# Patient Record
Sex: Male | Born: 1980 | Race: Black or African American | Hispanic: No | Marital: Single | Smoking: Current every day smoker
Health system: Southern US, Community
[De-identification: ages and names within clinical notes are randomized; demographics above are authoritative.]

## PROBLEM LIST (undated history)

## (undated) DIAGNOSIS — F259 Schizoaffective disorder, unspecified: Secondary | ICD-10-CM

## (undated) HISTORY — PX: CHOLECYSTECTOMY: SHX55

## (undated) HISTORY — DX: Schizoaffective disorder, unspecified: F25.9

## (undated) HISTORY — PX: LEG SURGERY: SHX1003

---

## 2010-12-05 ENCOUNTER — Emergency Department: Payer: Self-pay | Admitting: Emergency Medicine

## 2013-01-16 ENCOUNTER — Ambulatory Visit: Payer: Self-pay | Admitting: Urology

## 2013-10-27 ENCOUNTER — Encounter: Payer: Self-pay | Admitting: *Deleted

## 2013-11-12 ENCOUNTER — Encounter: Payer: Self-pay | Admitting: General Surgery

## 2013-11-12 ENCOUNTER — Ambulatory Visit (INDEPENDENT_AMBULATORY_CARE_PROVIDER_SITE_OTHER): Payer: Medicaid Other | Admitting: General Surgery

## 2013-11-12 VITALS — BP 100/58 | HR 76 | Resp 14 | Ht 72.0 in | Wt 166.0 lb

## 2013-11-12 DIAGNOSIS — L723 Sebaceous cyst: Secondary | ICD-10-CM

## 2013-11-12 NOTE — Patient Instructions (Signed)
Patient to return in one week nurse  

## 2013-11-12 NOTE — Progress Notes (Signed)
Patient ID: Manuel Horn, male   DOB: 1980/08/25, 33 y.o.   MRN: [id number]  Chief Complaint  Patient presents with  . Other    abscess to scalp    HPI Manuel Horn is a 33 y.o. male.  Here today for evaluation of abscess on his scalp. States it has been there for about 2 months.He states he noticed this while using his clippers.Patient states the area coming and goes over the past two years. Painful with touch, drainage on 10/24/13 after his visit with DR.Khan. He states it was brownish color that was draining. Finish up his Nitrofurantoin 100 mg on 11/06/13.  HPI  Past Medical History  Diagnosis Date  . Schizoaffective disorder     Past Surgical History  Procedure Laterality Date  . Leg surgery Left     No family history on file.  Social History History  Substance Use Topics  . Smoking status: Current Every Day Smoker -- 2.00 packs/day    Types: Cigarettes  . Smokeless tobacco: Never Used  . Alcohol Use: No    Allergies  Allergen Reactions  . Sulfa Antibiotics Rash    Current Outpatient Prescriptions  Medication Sig Dispense Refill  . Paliperidone Palmitate (INVEGA SUSTENNA) 234 MG/1.5ML SUSP Inject 1 Syringe into the muscle every 30 (thirty) days.       No current facility-administered medications for this visit.    Review of Systems Review of Systems  Constitutional: Negative.   Respiratory: Negative.   Cardiovascular: Negative.     Blood pressure 100/58, pulse 76, resp. rate 14, height 6' (1.829 m), weight 166 lb (75.297 kg).  Physical Exam Physical Exam  Constitutional: He is oriented to person, place, and time. He appears well-developed and well-nourished.  HENT:  Head:    Neurological: He is alert and oriented to person, place, and time.  Skin: Skin is warm and dry.  Examination of the skin on the scalp shows a sizable Sebaceous cyst with a small satellite area also identified.  Data Reviewed PCP notes Of October 24, 2013.  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 10 cc of 0.5% Xylocaine with 0.25% Marcaine with 1-200,000 units of epinephrine was utilized well tolerated. The area was excised elliptical incision. Hemostasis was with 3-0 Vicryl suture ligatures. Deep tissue was approximate 3-0 Vicryl. Skin was closed with 4-0 Prolene sutures. Bacitracin applied to the wound.  A prescription for Norco 5/325, #20 with the inscription 1-2 p.o. Q.4 h. P.r.n. For pain with no refills was provided.  Arrangements will be made for nursing exam and suture removal in one week and position exam in 2 weeks.      Ref: Vincent Gros NP Dr. Beverely Risen  Earline Mayotte 11/14/2013, 6:46 PM

## 2013-11-14 DIAGNOSIS — L723 Sebaceous cyst: Secondary | ICD-10-CM | POA: Insufficient documentation

## 2013-11-14 LAB — PATHOLOGY

## 2013-11-20 ENCOUNTER — Ambulatory Visit (INDEPENDENT_AMBULATORY_CARE_PROVIDER_SITE_OTHER): Payer: Medicaid Other | Admitting: *Deleted

## 2013-11-20 DIAGNOSIS — L723 Sebaceous cyst: Secondary | ICD-10-CM

## 2013-11-20 NOTE — Progress Notes (Signed)
Patient came in today for a wound check.  The wound is clean, with no signs of infection noted.The sutures were removed.  

## 2013-12-15 ENCOUNTER — Ambulatory Visit: Payer: Medicaid Other | Admitting: General Surgery

## 2014-01-14 ENCOUNTER — Encounter: Payer: Self-pay | Admitting: *Deleted

## 2017-07-09 ENCOUNTER — Ambulatory Visit: Payer: Self-pay | Admitting: Nurse Practitioner

## 2017-10-24 ENCOUNTER — Ambulatory Visit: Payer: Self-pay | Admitting: Adult Health

## 2017-11-01 ENCOUNTER — Ambulatory Visit: Payer: Medicaid Other | Admitting: Adult Health

## 2017-11-01 ENCOUNTER — Encounter: Payer: Self-pay | Admitting: Adult Health

## 2017-11-01 VITALS — BP 105/67 | HR 72 | Resp 16 | Ht 72.0 in | Wt 161.0 lb

## 2017-11-01 DIAGNOSIS — F17200 Nicotine dependence, unspecified, uncomplicated: Secondary | ICD-10-CM | POA: Diagnosis not present

## 2017-11-01 DIAGNOSIS — F259 Schizoaffective disorder, unspecified: Secondary | ICD-10-CM

## 2017-11-01 DIAGNOSIS — Z0001 Encounter for general adult medical examination with abnormal findings: Secondary | ICD-10-CM

## 2017-11-01 NOTE — Progress Notes (Signed)
[facility] [address] [place], [place] [address]  Internal MEDICINE  Office Visit Note  Patient Name: Manuel Horn  [id number]  [id number]  Date of Service: 11/20/2017  Chief Complaint  Patient presents with  . Annual Exam     HPI Pt is here for routine health maintenance examination. He denies complaints today.  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 He reports smoking approximately 2 packs per day. He denies alcohol or illicit drug use. He denies recent hospitalizations.       Current Medication: Outpatient Encounter Medications as of 11/01/2017  Medication Sig  . Paliperidone Palmitate (INVEGA SUSTENNA) 234 MG/1.5ML SUSP Inject 1 Syringe into the muscle every 30 (thirty) days.   No facility-administered encounter medications on file as of 11/01/2017.     Surgical History: Past Surgical History:  Procedure Laterality Date  . LEG SURGERY Left     Medical History: Past Medical History:  Diagnosis Date  . Schizoaffective disorder (HCC)     Family History: History reviewed. No pertinent family history.    Review of Systems  Constitutional: Negative.  Negative for chills, fatigue and unexpected weight change.  HENT: Negative.  Negative for congestion, rhinorrhea, sneezing and sore throat.   Eyes: Negative for redness.  Respiratory: Negative.  Negative for cough, chest tightness and shortness of breath.   Cardiovascular: Negative.  Negative for chest pain and palpitations.  Gastrointestinal: Negative.  Negative for abdominal pain, constipation, diarrhea, nausea and vomiting.  Endocrine: Negative.   Genitourinary: Negative.  Negative for dysuria and frequency.  Musculoskeletal: Negative.  Negative for arthralgias, back pain, joint swelling and neck pain.  Skin: Negative.  Negative for rash.  Allergic/Immunologic: Negative.   Neurological: Negative.  Negative for  tremors and numbness.  Hematological: Negative for adenopathy. Does not bruise/bleed easily.  Psychiatric/Behavioral: Negative.  Negative for behavioral problems, sleep disturbance and suicidal ideas. The patient is not nervous/anxious.      Vital Signs: BP 105/67   Pulse 72   Resp 16   Ht 6' (1.829 m)   Wt 161 lb (73 kg)   SpO2 97%   BMI 21.84 kg/m    Physical Exam  Constitutional: He is oriented to person, place, and time. He appears well-developed and well-nourished. No distress.  HENT:  Head: Normocephalic and atraumatic.  Mouth/Throat: Oropharynx is clear and moist. No oropharyngeal exudate.  Eyes: Pupils are equal, round, and reactive to light. EOM are normal.  Neck: Normal range of motion. Neck supple. No JVD present. No tracheal deviation present. No thyromegaly present.  Cardiovascular: Normal rate, regular rhythm and normal heart sounds. Exam reveals no gallop and no friction rub.  No murmur heard. Pulmonary/Chest: Effort normal and breath sounds normal. No respiratory distress. He has no wheezes. He has no rales. He exhibits no tenderness.  Abdominal: Soft. There is no tenderness. There is no guarding.  Musculoskeletal: Normal range of motion.  Lymphadenopathy:    He has no cervical adenopathy.  Neurological: He is alert and oriented to person, place, and time. No cranial nerve deficit.  Skin: Skin is warm and dry. He is not diaphoretic.  Psychiatric: He has a normal mood and affect. His behavior is normal. Judgment and thought content normal.  Nursing note and vitals reviewed.    LABS: Recent Results (from the past 2160 hour(s))  CBC with Differential/Platelet     Status: Abnormal   Collection Time: 11/05/17  11:44 AM  Result Value Ref Range   WBC 10.5 3.4 - 10.8 x10E3/uL   RBC 4.13 (L) 4.14 - 5.80 x10E6/uL   Hemoglobin 13.6 13.0 - 17.7 g/dL   Hematocrit 45.440.4 09.837.5 - 51.0 %   MCV 98 (H) 79 - 97 fL   MCH 32.9 26.6 - 33.0 pg   MCHC 33.7 31.5 - 35.7 g/dL   RDW  11.913.4 14.712.3 - 82.915.4 %   Platelets 199 150 - 450 x10E3/uL   Neutrophils 63 Not Estab. %   Lymphs 32 Not Estab. %   Monocytes 5 Not Estab. %   Eos 0 Not Estab. %   Basos 0 Not Estab. %   Neutrophils Absolute 6.6 1.4 - 7.0 x10E3/uL   Lymphocytes Absolute 3.3 (H) 0.7 - 3.1 x10E3/uL   Monocytes Absolute 0.5 0.1 - 0.9 x10E3/uL   EOS (ABSOLUTE) 0.0 0.0 - 0.4 x10E3/uL   Basophils Absolute 0.0 0.0 - 0.2 x10E3/uL   Immature Granulocytes 0 Not Estab. %   Immature Grans (Abs) 0.0 0.0 - 0.1 x10E3/uL  Lipid Panel With LDL/HDL Ratio     Status: Abnormal   Collection Time: 11/05/17 11:44 AM  Result Value Ref Range   Cholesterol, Total 133 100 - 199 mg/dL   Triglycerides 96 0 - 149 mg/dL   HDL 25 (L) >56>39 mg/dL   VLDL Cholesterol Cal 19 5 - 40 mg/dL   LDL Calculated 89 0 - 99 mg/dL   LDl/HDL Ratio 3.6 0.0 - 3.6 ratio    Comment:                                     LDL/HDL Ratio                                             Men  Women                               1/2 Avg.Risk  1.0    1.5                                   Avg.Risk  3.6    3.2                                2X Avg.Risk  6.2    5.0                                3X Avg.Risk  8.0    6.1   TSH     Status: None   Collection Time: 11/05/17 11:44 AM  Result Value Ref Range   TSH 0.751 0.450 - 4.500 uIU/mL  T4, free     Status: None   Collection Time: 11/05/17 11:44 AM  Result Value Ref Range   Free T4 1.13 0.82 - 1.77 ng/dL  Comprehensive metabolic panel     Status: Abnormal   Collection Time: 11/05/17 11:44 AM  Result Value Ref Range   Glucose 93 65 - 99 mg/dL   BUN 10 6 - 20 mg/dL  Creatinine, Ser 1.31 (H) 0.76 - 1.27 mg/dL   GFR calc non Af Amer 69 >59 mL/min/1.73   GFR calc Af Amer 80 >59 mL/min/1.73   BUN/Creatinine Ratio 8 (L) 9 - 20   Sodium 140 134 - 144 mmol/L   Potassium 4.5 3.5 - 5.2 mmol/L   Chloride 102 96 - 106 mmol/L   CO2 24 20 - 29 mmol/L   Calcium 9.6 8.7 - 10.2 mg/dL   Total Protein 7.6 6.0 - 8.5 g/dL    Albumin 4.6 3.5 - 5.5 g/dL   Globulin, Total 3.0 1.5 - 4.5 g/dL   Albumin/Globulin Ratio 1.5 1.2 - 2.2   Bilirubin Total 0.6 0.0 - 1.2 mg/dL   Alkaline Phosphatase 79 39 - 117 IU/L   AST 15 0 - 40 IU/L   ALT 7 0 - 44 IU/L    Assessment/Plan: 1. Encounter for general adult medical examination with abnormal findings - CBC with Differential/Platelet - Lipid Panel With LDL/HDL Ratio - TSH - T4, free - Comprehensive metabolic panel  2. Schizoaffective disorder, unspecified type (HCC) See [facility] and they prescribe his [place].    3. Smoking Smoking cessation counseling: 1. Pt acknowledges the risks of long term smoking, she will try to quite smoking. 2. Options for different medications including nicotine products, chewing gum, patch etc, Wellbutrin and Chantix is discussed 3. Goal and date of compete cessation is discussed 4. Total time spent in smoking cessation is 15 min.  General Counseling: Joseph ArtBrian verbalizes understanding of the findings of todays visit and agrees with plan of treatment. I have discussed any further diagnostic evaluation that may be needed or ordered today. We also reviewed his medications today. he has been encouraged to call the office with any questions or concerns that should arise related to todays visit.   Orders Placed This Encounter  Procedures  . CBC with Differential/Platelet  . Lipid Panel With LDL/HDL Ratio  . TSH  . T4, free  . Comprehensive metabolic panel     Time spent: 25 Minutes   This patient was seen by Blima LedgerAdam Sweden Lesure AGNP-C in Collaboration with Dr Lyndon CodeFozia M Khan as a part of collaborative care agreement   Lyndon CodeFozia M Khan, MD  Internal Medicine

## 2017-11-01 NOTE — Patient Instructions (Signed)
Coping with Quitting Smoking Quitting smoking is a physical and mental challenge. You will face cravings, withdrawal symptoms, and temptation. Before quitting, work with your health care provider to make a plan that can help you cope. Preparation can help you quit and keep you from giving in. How can I cope with cravings? Cravings usually last for 5-10 minutes. If you get through it, the craving will pass. Consider taking the following actions to help you cope with cravings:  Keep your mouth busy: ? Chew sugar-free gum. ? Suck on hard candies or a straw. ? Brush your teeth.  Keep your hands and body busy: ? Immediately change to a different activity when you feel a craving. ? Squeeze or play with a ball. ? Do an activity or a hobby, like making bead jewelry, practicing needlepoint, or working with wood. ? Mix up your normal routine. ? Take a short exercise break. Go for a quick walk or run up and down stairs. ? Spend time in public places where smoking is not allowed.  Focus on doing something kind or helpful for someone else.  Call a friend or family member to talk during a craving.  Join a support group.  Call a quit line, such as 1-800-QUIT-NOW.  Talk with your health care provider about medicines that might help you cope with cravings and make quitting easier for you.  How can I deal with withdrawal symptoms? Your body may experience negative effects as it tries to get used to not having nicotine in the system. These effects are called withdrawal symptoms. They may include:  Feeling hungrier than normal.  Trouble concentrating.  Irritability.  Trouble sleeping.  Feeling depressed.  Restlessness and agitation.  Craving a cigarette.  To manage withdrawal symptoms:  Avoid places, people, and activities that trigger your cravings.  Remember why you want to quit.  Get plenty of sleep.  Avoid coffee and other caffeinated drinks. These may worsen some of your  symptoms.  How can I handle social situations? Social situations can be difficult when you are quitting smoking, especially in the first few weeks. To manage this, you can:  Avoid parties, bars, and other social situations where people might be smoking.  Avoid alcohol.  Leave right away if you have the urge to smoke.  Explain to your family and friends that you are quitting smoking. Ask for understanding and support.  Plan activities with friends or family where smoking is not an option.  What are some ways I can cope with stress? Wanting to smoke may cause stress, and stress can make you want to smoke. Find ways to manage your stress. Relaxation techniques can help. For example:  Breathe slowly and deeply, in through your nose and out through your mouth.  Listen to soothing, relaxing music.  Talk with a family member or friend about your stress.  Light a candle.  Soak in a bath or take a shower.  Think about a peaceful place.  What are some ways I can prevent weight gain? Be aware that many people gain weight after they quit smoking. However, not everyone does. To keep from gaining weight, have a plan in place before you quit and stick to the plan after you quit. Your plan should include:  Having healthy snacks. When you have a craving, it may help to: ? Eat plain popcorn, crunchy carrots, celery, or other cut vegetables. ? Chew sugar-free gum.  Changing how you eat: ? Eat small portion sizes at meals. ?   Eat 4-6 small meals throughout the day instead of 1-2 large meals a day. ? Be mindful when you eat. Do not watch television or do other things that might distract you as you eat.  Exercising regularly: ? Make time to exercise each day. If you do not have time for a long workout, do short bouts of exercise for 5-10 minutes several times a day. ? Do some form of strengthening exercise, like weight lifting, and some form of aerobic exercise, like running or  swimming.  Drinking plenty of water or other low-calorie or no-calorie drinks. Drink 6-8 glasses of water daily, or as much as instructed by your health care provider.  Summary  Quitting smoking is a physical and mental challenge. You will face cravings, withdrawal symptoms, and temptation to smoke again. Preparation can help you as you go through these challenges.  You can cope with cravings by keeping your mouth busy (such as by chewing gum), keeping your body and hands busy, and making calls to family, friends, or a helpline for people who want to quit smoking.  You can cope with withdrawal symptoms by avoiding places where people smoke, avoiding drinks with caffeine, and getting plenty of rest.  Ask your health care provider about the different ways to prevent weight gain, avoid stress, and handle social situations. This information is not intended to replace advice given to you by your health care provider. Make sure you discuss any questions you have with your health care provider. Document Released: 03/03/2016 Document Revised: 03/03/2016 Document Reviewed: 03/03/2016 Elsevier Interactive Patient Education  2018 Elsevier Inc.  

## 2017-11-05 ENCOUNTER — Encounter: Payer: Self-pay | Admitting: Emergency Medicine

## 2017-11-05 ENCOUNTER — Other Ambulatory Visit: Payer: Self-pay

## 2017-11-05 ENCOUNTER — Emergency Department
Admission: EM | Admit: 2017-11-05 | Discharge: 2017-11-05 | Disposition: A | Discharge status: Home / Self Care | Payer: No Typology Code available for payment source | Attending: Emergency Medicine | Admitting: Emergency Medicine

## 2017-11-05 DIAGNOSIS — Z23 Encounter for immunization: Secondary | ICD-10-CM | POA: Diagnosis not present

## 2017-11-05 DIAGNOSIS — S8991XA Unspecified injury of right lower leg, initial encounter: Secondary | ICD-10-CM | POA: Diagnosis present

## 2017-11-05 DIAGNOSIS — F17210 Nicotine dependence, cigarettes, uncomplicated: Secondary | ICD-10-CM | POA: Diagnosis not present

## 2017-11-05 DIAGNOSIS — Y92410 Unspecified street and highway as the place of occurrence of the external cause: Secondary | ICD-10-CM | POA: Insufficient documentation

## 2017-11-05 DIAGNOSIS — S8001XA Contusion of right knee, initial encounter: Secondary | ICD-10-CM

## 2017-11-05 DIAGNOSIS — Z79899 Other long term (current) drug therapy: Secondary | ICD-10-CM | POA: Insufficient documentation

## 2017-11-05 DIAGNOSIS — Y939 Activity, unspecified: Secondary | ICD-10-CM | POA: Insufficient documentation

## 2017-11-05 DIAGNOSIS — Y998 Other external cause status: Secondary | ICD-10-CM | POA: Diagnosis not present

## 2017-11-05 MED ORDER — TETANUS-DIPHTH-ACELL PERTUSSIS 5-2.5-18.5 LF-MCG/0.5 IM SUSP
0.5000 mL | Freq: Once | INTRAMUSCULAR | Status: AC
  Administered 2017-11-05: 0.5 mL via INTRAMUSCULAR
  Filled 2017-11-05: qty 0.5
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
## 2017-11-05 NOTE — Discharge Instructions (Addendum)
Follow-up with your primary care provider if any continued problems.  Apply ice to your knee as needed for pain or swelling.  Watch the abrasion for any signs of infection.  You may take Tylenol or ibuprofen as needed for pain or stiffness.  Be aware that you will be sore and stiff all over likely the next 2 to 3 days.
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
## 2017-11-07 LAB — COMPREHENSIVE METABOLIC PANEL
ALBUMIN: 4.6 g/dL (ref 3.5–5.5)
ALT: 7 IU/L (ref 0–44)
AST: 15 IU/L (ref 0–40)
Albumin/Globulin Ratio: 1.5 (ref 1.2–2.2)
Alkaline Phosphatase: 79 IU/L (ref 39–117)
BUN / CREAT RATIO: 8 — AB (ref 9–20)
BUN: 10 mg/dL (ref 6–20)
Bilirubin Total: 0.6 mg/dL (ref 0.0–1.2)
CO2: 24 mmol/L (ref 20–29)
CREATININE: 1.31 mg/dL — AB (ref 0.76–1.27)
Calcium: 9.6 mg/dL (ref 8.7–10.2)
Chloride: 102 mmol/L (ref 96–106)
GFR calc Af Amer: 80 mL/min/{1.73_m2} (ref >59)
GFR calc non Af Amer: 69 mL/min/{1.73_m2} (ref >59)
GLUCOSE: 93 mg/dL (ref 65–99)
Globulin, Total: 3 g/dL (ref 1.5–4.5)
Potassium: 4.5 mmol/L (ref 3.5–5.2)
Sodium: 140 mmol/L (ref 134–144)
Total Protein: 7.6 g/dL (ref 6.0–8.5)

## 2017-11-07 LAB — CBC WITH DIFFERENTIAL/PLATELET
Basophils Absolute: 0 10*3/uL (ref 0.0–0.2)
Basos: 0 %
EOS (ABSOLUTE): 0 10*3/uL (ref 0.0–0.4)
Eos: 0 %
Hematocrit: 40.4 % (ref 37.5–51.0)
Hemoglobin: 13.6 g/dL (ref 13.0–17.7)
IMMATURE GRANULOCYTES: 0 %
Immature Grans (Abs): 0 10*3/uL (ref 0.0–0.1)
Lymphocytes Absolute: 3.3 10*3/uL — ABNORMAL HIGH (ref 0.7–3.1)
Lymphs: 32 %
MCH: 32.9 pg (ref 26.6–33.0)
MCHC: 33.7 g/dL (ref 31.5–35.7)
MCV: 98 fL — ABNORMAL HIGH (ref 79–97)
Monocytes Absolute: 0.5 10*3/uL (ref 0.1–0.9)
Monocytes: 5 %
NEUTROS PCT: 63 %
Neutrophils Absolute: 6.6 10*3/uL (ref 1.4–7.0)
PLATELETS: 199 10*3/uL (ref 150–450)
RBC: 4.13 x10E6/uL — ABNORMAL LOW (ref 4.14–5.80)
RDW: 13.4 % (ref 12.3–15.4)
WBC: 10.5 10*3/uL (ref 3.4–10.8)

## 2017-11-07 LAB — LIPID PANEL WITH LDL/HDL RATIO
Cholesterol, Total: 133 mg/dL (ref 100–199)
HDL: 25 mg/dL — ABNORMAL LOW (ref >39)
LDL Calculated: 89 mg/dL (ref 0–99)
LDl/HDL Ratio: 3.6 ratio (ref 0.0–3.6)
Triglycerides: 96 mg/dL (ref 0–149)
VLDL Cholesterol Cal: 19 mg/dL (ref 5–40)

## 2017-11-07 LAB — T4, FREE: FREE T4: 1.13 ng/dL (ref 0.82–1.77)

## 2017-11-07 LAB — TSH: TSH: 0.751 u[IU]/mL (ref 0.450–4.500)

## 2017-11-22 ENCOUNTER — Telehealth: Payer: Self-pay | Admitting: Internal Medicine

## 2017-11-22 ENCOUNTER — Other Ambulatory Visit: Payer: Self-pay | Admitting: Internal Medicine

## 2017-11-22 DIAGNOSIS — R7989 Other specified abnormal findings of blood chemistry: Secondary | ICD-10-CM

## 2017-11-22 NOTE — Telephone Encounter (Signed)
Called and lm for patient to return call regarding labs

## 2017-11-22 NOTE — Telephone Encounter (Signed)
-----   Message from Lyndon Code, MD sent at 11/22/2017  8:21 AM EDT ----- Regarding: abnromal renal functions( kidneys are slightly slow)  His cr is slightly elevated for his age, ask him if he is taking any protein powders or supplements, will repeat his labs in October , order is sent to [facility], he can go mid October

## 2017-11-22 NOTE — Progress Notes (Signed)
bas

## 2017-11-22 NOTE — Telephone Encounter (Signed)
Spoke to his mother and she states that he does not take any supplements or protein. Pt will redo labs in mid October.

## 2018-01-18 ENCOUNTER — Other Ambulatory Visit: Payer: Self-pay | Admitting: Internal Medicine

## 2018-01-19 LAB — BASIC METABOLIC PANEL
BUN / CREAT RATIO: 4 — AB (ref 9–20)
BUN: 5 mg/dL — AB (ref 6–20)
CHLORIDE: 100 mmol/L (ref 96–106)
CO2: 25 mmol/L (ref 20–29)
Calcium: 9.6 mg/dL (ref 8.7–10.2)
Creatinine, Ser: 1.14 mg/dL (ref 0.76–1.27)
GFR calc non Af Amer: 82 mL/min/{1.73_m2} (ref >59)
GFR, EST AFRICAN AMERICAN: 94 mL/min/{1.73_m2} (ref >59)
GLUCOSE: 107 mg/dL — AB (ref 65–99)
POTASSIUM: 3.8 mmol/L (ref 3.5–5.2)
Sodium: 142 mmol/L (ref 134–144)

## 2018-09-02 DIAGNOSIS — J301 Allergic rhinitis due to pollen: Secondary | ICD-10-CM

## 2018-11-05 ENCOUNTER — Encounter: Payer: Self-pay | Admitting: Adult Health

## 2018-11-05 ENCOUNTER — Other Ambulatory Visit: Payer: Self-pay

## 2018-11-05 ENCOUNTER — Ambulatory Visit: Payer: Medicaid Other | Admitting: Adult Health

## 2018-11-05 VITALS — BP 102/64 | HR 85 | Resp 16 | Ht 72.0 in | Wt 157.0 lb

## 2018-11-05 DIAGNOSIS — R300 Dysuria: Secondary | ICD-10-CM | POA: Diagnosis not present

## 2018-11-05 DIAGNOSIS — Z0001 Encounter for general adult medical examination with abnormal findings: Secondary | ICD-10-CM

## 2018-11-05 DIAGNOSIS — F259 Schizoaffective disorder, unspecified: Secondary | ICD-10-CM | POA: Diagnosis not present

## 2018-11-05 DIAGNOSIS — F17200 Nicotine dependence, unspecified, uncomplicated: Secondary | ICD-10-CM | POA: Diagnosis not present

## 2018-11-05 NOTE — Progress Notes (Signed)
[facility] [place], [place] [address]  Internal MEDICINE  Office Visit Note  Patient Name: Manuel Horn  [id number]  [id number]  Date of Service: 11/05/2018  Chief Complaint  Patient presents with  . Medical Management of Chronic Issues  . Annual Exam  . Schizophrenia     HPI Pt is here for routine health maintenance examination.  He is a well appearing 38 yo AA male.  He has a history of schizophrenia. Overall he is doing well he denies any current issues.  He takes the [place] shot monthly for his schizophrenia.  He sees [facility], he has an appt tomorrow. He denies alcohol or illicit drug use. However he does reports smoking 2 ppd of cigarettes.      Current Medication: Outpatient Encounter Medications as of 11/05/2018  Medication Sig  . Paliperidone Palmitate (INVEGA SUSTENNA) 234 MG/1.5ML SUSP Inject 1 Syringe into the muscle every 30 (thirty) days.  . [DISCONTINUED] Paliperidone ER (INVEGA SUSTENNA) injection Inject into the muscle.   No facility-administered encounter medications on file as of 11/05/2018.     Surgical History: Past Surgical History:  Procedure Laterality Date  . LEG SURGERY Left     Medical History: Past Medical History:  Diagnosis Date  . Schizoaffective disorder ([place])     Family History: Family History  Family history unknown: Yes      Review of Systems  Constitutional: Negative.  Negative for chills, fatigue and unexpected weight change.  HENT: Negative.  Negative for congestion, rhinorrhea, sneezing and sore throat.   Eyes: Negative for redness.  Respiratory: Negative.  Negative for cough, chest tightness and shortness of breath.   Cardiovascular: Negative.  Negative for chest pain and palpitations.  Gastrointestinal: Negative.  Negative for abdominal pain, constipation, diarrhea, nausea and vomiting.  Endocrine: Negative.   Genitourinary: Negative.  Negative for dysuria and frequency.   Musculoskeletal: Negative.  Negative for arthralgias, back pain, joint swelling and neck pain.  Skin: Negative.  Negative for rash.  Allergic/Immunologic: Negative.   Neurological: Negative.  Negative for tremors and numbness.  Hematological: Negative for adenopathy. Does not bruise/bleed easily.  Psychiatric/Behavioral: Negative.  Negative for behavioral problems, sleep disturbance and suicidal ideas. The patient is not nervous/anxious.      Vital Signs: BP 102/64   Pulse 85   Resp 16   Ht 6' (1.829 m)   Wt 157 lb (71.2 kg)   SpO2 98%   BMI 21.29 kg/m    Physical Exam Vitals signs and nursing note reviewed.  Constitutional:      General: He is not in acute distress.    Appearance: He is well-developed. He is not diaphoretic.  HENT:     Head: Normocephalic and atraumatic.     Mouth/Throat:     Pharynx: No oropharyngeal exudate.  Eyes:     Pupils: Pupils are equal, round, and reactive to light.  Neck:     Musculoskeletal: Normal range of motion and neck supple.     Thyroid: No thyromegaly.     Vascular: No JVD.     Trachea: No tracheal deviation.  Cardiovascular:     Rate and Rhythm: Normal rate and regular rhythm.     Heart sounds: Normal heart sounds. No murmur. No friction rub. No gallop.   Pulmonary:     Effort: Pulmonary effort is normal. No respiratory distress.     Breath sounds: Normal breath sounds. No wheezing or rales.  Chest:     Chest  wall: No tenderness.  Abdominal:     Palpations: Abdomen is soft.     Tenderness: There is no abdominal tenderness. There is no guarding.  Musculoskeletal: Normal range of motion.  Lymphadenopathy:     Cervical: No cervical adenopathy.  Skin:    General: Skin is warm and dry.  Neurological:     Mental Status: He is alert and oriented to person, place, and time.     Cranial Nerves: No cranial nerve deficit.  Psychiatric:        Behavior: Behavior normal.        Thought Content: Thought content normal.         Judgment: Judgment normal.      LABS: No results found for this or any previous visit (from the past 2160 hour(s)).   Assessment/Plan: 1. Encounter for general adult medical examination with abnormal findings Up to date on PHM. - CBC with Differential/Platelet - Lipid Panel With LDL/HDL Ratio - TSH - T4, free - Comprehensive metabolic panel  2. Schizoaffective disorder, unspecified type (HCC) Currently appears to be well controlled on Invega.  Continue current treatment.   3. Smoking Smoking cessation counseling: 1. Pt acknowledges the risks of long term smoking, she will try to quite smoking. 2. Options for different medications including nicotine products, chewing gum, patch etc, Wellbutrin and Chantix is discussed 3. Goal and date of compete cessation is discussed 4. Total time spent in smoking cessation is 15 min.  General Counseling: Joseph ArtBrian verbalizes understanding of the findings of todays visit and agrees with plan of treatment. I have discussed any further diagnostic evaluation that may be needed or ordered today. We also reviewed his medications today. he has been encouraged to call the office with any questions or concerns that should arise related to todays visit.   Orders Placed This Encounter  Procedures  . CBC with Differential/Platelet  . Lipid Panel With LDL/HDL Ratio  . TSH  . T4, free  . Comprehensive metabolic panel    No orders of the defined types were placed in this encounter.   Time spent: 25 Minutes   This patient was seen by Blima LedgerAdam Ayson Cherubini AGNP-C in Collaboration with Dr Lyndon CodeFozia M Khan as a part of collaborative care agreement    Johnna AcostaAdam J. Khaliq Turay AGNP-C Internal Medicine

## 2018-11-07 LAB — URINE CULTURE, REFLEX

## 2018-11-07 LAB — UA/M W/RFLX CULTURE, ROUTINE
Bilirubin, UA: NEGATIVE
Glucose, UA: NEGATIVE
Ketones, UA: NEGATIVE
Nitrite, UA: NEGATIVE
Protein,UA: NEGATIVE
Specific Gravity, UA: 1.01 (ref 1.005–1.030)
Urobilinogen, Ur: 0.2 mg/dL (ref 0.2–1.0)
pH, UA: 6.5 (ref 5.0–7.5)

## 2018-11-07 LAB — MICROSCOPIC EXAMINATION: Casts: NONE SEEN /lpf

## 2018-11-28 ENCOUNTER — Other Ambulatory Visit: Payer: Self-pay

## 2018-11-28 ENCOUNTER — Emergency Department: Payer: Medicaid Other

## 2018-11-28 ENCOUNTER — Emergency Department
Admission: EM | Admit: 2018-11-28 | Discharge: 2018-11-28 | Disposition: A | Discharge status: Home / Self Care | Payer: Medicaid Other | Attending: Emergency Medicine | Admitting: Emergency Medicine

## 2018-11-28 DIAGNOSIS — F17210 Nicotine dependence, cigarettes, uncomplicated: Secondary | ICD-10-CM | POA: Insufficient documentation

## 2018-11-28 DIAGNOSIS — K8020 Calculus of gallbladder without cholecystitis without obstruction: Secondary | ICD-10-CM | POA: Insufficient documentation

## 2018-11-28 DIAGNOSIS — R112 Nausea with vomiting, unspecified: Secondary | ICD-10-CM

## 2018-11-28 DIAGNOSIS — N390 Urinary tract infection, site not specified: Secondary | ICD-10-CM | POA: Insufficient documentation

## 2018-11-28 LAB — URINALYSIS, COMPLETE (UACMP) WITH MICROSCOPIC
Bilirubin Urine: NEGATIVE
Glucose, UA: NEGATIVE mg/dL
Ketones, ur: NEGATIVE mg/dL
Nitrite: NEGATIVE
Protein, ur: 30 mg/dL — AB
Specific Gravity, Urine: 1.028 (ref 1.005–1.030)
pH: 5 (ref 5.0–8.0)

## 2018-11-28 LAB — COMPREHENSIVE METABOLIC PANEL
ALT: 10 U/L (ref 0–44)
AST: 17 U/L (ref 15–41)
Albumin: 4.3 g/dL (ref 3.5–5.0)
Alkaline Phosphatase: 67 U/L (ref 38–126)
Anion gap: 8 (ref 5–15)
BUN: 5 mg/dL — ABNORMAL LOW (ref 6–20)
CO2: 30 mmol/L (ref 22–32)
Calcium: 9.2 mg/dL (ref 8.9–10.3)
Chloride: 101 mmol/L (ref 98–111)
Creatinine, Ser: 1.04 mg/dL (ref 0.61–1.24)
GFR calc Af Amer: >60 mL/min (ref >60)
GFR calc non Af Amer: >60 mL/min (ref >60)
Glucose, Bld: 117 mg/dL — ABNORMAL HIGH (ref 70–99)
Potassium: 4 mmol/L (ref 3.5–5.1)
Sodium: 139 mmol/L (ref 135–145)
Total Bilirubin: 0.4 mg/dL (ref 0.3–1.2)
Total Protein: 8 g/dL (ref 6.5–8.1)

## 2018-11-28 LAB — LIPASE, BLOOD: Lipase: 19 U/L (ref 11–51)

## 2018-11-28 LAB — CBC
HCT: 39.4 % (ref 39.0–52.0)
Hemoglobin: 13.6 g/dL (ref 13.0–17.0)
MCH: 33.4 pg (ref 26.0–34.0)
MCHC: 34.5 g/dL (ref 30.0–36.0)
MCV: 96.8 fL (ref 80.0–100.0)
Platelets: 190 10*3/uL (ref 150–400)
RBC: 4.07 MIL/uL — ABNORMAL LOW (ref 4.22–5.81)
RDW: 12 % (ref 11.5–15.5)
WBC: 12.7 10*3/uL — ABNORMAL HIGH (ref 4.0–10.5)
nRBC: 0 % (ref 0.0–0.2)

## 2018-11-28 MED ORDER — CIPROFLOXACIN HCL 500 MG PO TABS: 500.0000 mg | ORAL_TABLET | Freq: Two times a day (BID) | ORAL | 0 refills | Status: AC

## 2018-11-28 MED ORDER — FAMOTIDINE 20 MG PO TABS: 20.0000 mg | ORAL_TABLET | Freq: Two times a day (BID) | ORAL | 1 refills | Status: DC

## 2018-11-28 MED ORDER — SODIUM CHLORIDE 0.9% FLUSH: 3.0000 mL | Freq: Once | INTRAVENOUS | Status: DC

## 2018-11-28 MED ORDER — ONDANSETRON 4 MG PO TBDP: 4.0000 mg | ORAL_TABLET | Freq: Three times a day (TID) | ORAL | 0 refills | Status: DC | PRN

## 2018-11-28 NOTE — ED Notes (Signed)
Family at bedside. 

## 2018-11-28 NOTE — ED Provider Notes (Addendum)
[facility] Emergency Department Provider Note       Time seen: ----------------------------------------- 1:16 PM on 11/28/2018 -----------------------------------------   I have reviewed the triage vital signs and the nursing notes.  HISTORY   Chief Complaint Abdominal Pain    HPI Manuel Horn is a 38 y.o. male with a history of schizoaffective disorder who presents to the ED for abdominal pain with nausea and vomiting for a year that he states started up again today.  Patient thought he probably need to be checked out for this.  He does report a history of UTI in the past.  He currently has no discomfort at this time.  Past Medical History:  Diagnosis Date  . Schizoaffective disorder [facility])     Patient Active Problem List   Diagnosis Date Noted  . Sebaceous cyst 11/14/2013    Past Surgical History:  Procedure Laterality Date  . LEG SURGERY Left     Allergies Sulfa antibiotics  Social History Social History   Tobacco Use  . Smoking status: Current Every Day Smoker    Packs/day: 2.00    Types: Cigarettes  . Smokeless tobacco: Never Used  Substance Use Topics  . Alcohol use: No  . Drug use: No   Review of Systems Constitutional: Negative for fever. Cardiovascular: Negative for chest pain. Respiratory: Negative for shortness of breath. Gastrointestinal: Positive for abdominal pain, nausea vomiting Musculoskeletal: Negative for back pain. Skin: Negative for rash. Neurological: Negative for headaches, focal weakness or numbness.  All systems negative/normal/unremarkable except as stated in the HPI  ____________________________________________   PHYSICAL EXAM:  VITAL SIGNS: ED Triage Vitals  Enc Vitals Group     BP 11/28/18 1035 124/72     Pulse Rate 11/28/18 1035 63     Resp 11/28/18 1035 17     Temp 11/28/18 1035 98.4 F (36.9 C)     Temp Source 11/28/18 1035 Oral     SpO2 11/28/18 1035 100 %     Weight 11/28/18 1036  160 lb (72.6 kg)     Height 11/28/18 1036 6' (1.829 m)     Head Circumference --      Peak Flow --      Pain Score 11/28/18 1036 3     Pain Loc --      Pain Edu? --      Excl. in [place]? --     Constitutional: Alert and oriented. Well appearing and in no distress. Eyes: Conjunctivae are normal. Normal extraocular movements. ENT      Head: Normocephalic and atraumatic.      Nose: No congestion/rhinnorhea.      Mouth/Throat: Mucous membranes are moist.      Neck: No stridor. Cardiovascular: Normal rate, regular rhythm. No murmurs, rubs, or gallops. Respiratory: Normal respiratory effort without tachypnea nor retractions. Breath sounds are clear and equal bilaterally. No wheezes/rales/rhonchi. Gastrointestinal: Soft and nontender. Normal bowel sounds Musculoskeletal: Nontender with normal range of motion in extremities. No lower extremity tenderness nor edema. Neurologic:  Normal speech and language. No gross focal neurologic deficits are appreciated.  Skin:  Skin is warm, dry and intact. No rash noted. Psychiatric: Bizarre mood and affect at times ____________________________________________  ED COURSE:  As part of my medical decision making, I reviewed the following data within the [place] History obtained from family if available, nursing notes, old chart and ekg, as well as notes from prior ED visits. Patient presented for abdominal pain with nausea vomiting, we will  assess with labs and imaging as indicated at this time.   Procedures  Manuel Horn was evaluated in Emergency Department on 11/28/2018 for the symptoms described in the history of present illness. He was evaluated in the context of the global COVID-19 pandemic, which necessitated consideration that the patient might be at risk for infection with the SARS-CoV-2 virus that causes COVID-19. Institutional protocols and algorithms that pertain to the evaluation of patients at risk for COVID-19 are in a state  of rapid change based on information released by regulatory bodies including the CDC and federal and state organizations. These policies and algorithms were followed during the patient's care in the ED.  ____________________________________________   LABS (pertinent positives/negatives)  Labs Reviewed  COMPREHENSIVE METABOLIC PANEL - Abnormal; Notable for the following components:      Result Value   Glucose, Bld 117 (*)    BUN 5 (*)    All other components within normal limits  CBC - Abnormal; Notable for the following components:   WBC 12.7 (*)    RBC 4.07 (*)    All other components within normal limits  URINALYSIS, COMPLETE (UACMP) WITH MICROSCOPIC - Abnormal; Notable for the following components:   Color, Urine YELLOW (*)    APPearance HAZY (*)    Hgb urine dipstick SMALL (*)    Protein, ur 30 (*)    Leukocytes,Ua MODERATE (*)    Bacteria, UA FEW (*)    All other components within normal limits  URINE CULTURE  LIPASE, BLOOD    RADIOLOGY Images were viewed by me  CT renal protocol Did not reveal any acute process US IMPRESSION: Cholelithiasis without sonographic features of acute cholecystitis ____________________________________________   DIFFERENTIAL DIAGNOSIS   GERD, peptic ulcer disease, schizoaffective disorder, UTI, pyelonephritis, gastroparesis  FINAL ASSESSMENT AND PLAN  Abdominal pain, vomiting, cholelithiasis, UTI   Plan: The patient had presented for abdominal pain and vomiting. Patient's labs surprisingly revealed a urinary tract infection.  I will resend a urine culture, he will receive Keflex.  Patient's imaging did not reveal any acute process but did reveal gallstones, I will add antiemetics to his regimen.  Otherwise he is cleared for outpatient follow-up with general surgery for gallstones.   Ulice DashJohnathan E Ravin Denardo, MD    Note: This note was generated in part or whole with voice recognition software. Voice recognition is usually quite accurate  but there are transcription errors that can and very often do occur. I apologize for any typographical errors that were not detected and corrected.     Emily FilbertWilliams, Ariaunna Longsworth E, MD 11/28/18 1317    Emily FilbertWilliams, Morena Mckissack E, MD 11/28/18 [id number]

## 2018-11-28 NOTE — ED Notes (Signed)
Discharge instructions reviewed with patient and mother. Pt denies further questions and is in NAD at time of discharge (1600)

## 2018-11-28 NOTE — ED Triage Notes (Signed)
Pt c/o intermittent abd pain with N/v for a year and states it started up again today and figured he needed to finally be checked out. Pt is in NAD on arrival.

## 2018-11-30 LAB — URINE CULTURE
Culture: <10000 — AB
Special Requests: NORMAL

## 2018-12-10 ENCOUNTER — Other Ambulatory Visit: Payer: Self-pay | Admitting: Surgery

## 2018-12-10 DIAGNOSIS — K8050 Calculus of bile duct without cholangitis or cholecystitis without obstruction: Secondary | ICD-10-CM

## 2018-12-23 ENCOUNTER — Other Ambulatory Visit: Payer: Self-pay

## 2018-12-23 ENCOUNTER — Encounter: Payer: Self-pay | Admitting: Adult Health

## 2018-12-23 ENCOUNTER — Ambulatory Visit: Payer: Medicaid Other | Admitting: Adult Health

## 2018-12-23 VITALS — BP 100/70 | HR 79 | Temp 97.6°F | Resp 16 | Ht 72.0 in | Wt 159.0 lb

## 2018-12-23 DIAGNOSIS — R7989 Other specified abnormal findings of blood chemistry: Secondary | ICD-10-CM | POA: Diagnosis not present

## 2018-12-23 DIAGNOSIS — R1011 Right upper quadrant pain: Secondary | ICD-10-CM | POA: Diagnosis not present

## 2018-12-23 DIAGNOSIS — F259 Schizoaffective disorder, unspecified: Secondary | ICD-10-CM | POA: Diagnosis not present

## 2018-12-23 DIAGNOSIS — F17200 Nicotine dependence, unspecified, uncomplicated: Secondary | ICD-10-CM | POA: Diagnosis not present

## 2018-12-23 NOTE — Progress Notes (Signed)
[facility] [address] [place], [place] [address]  Internal MEDICINE  Office Visit Note  Patient Name: Manuel Horn  [id number]  [id number]  Date of Service: 12/23/2018  Chief Complaint  Patient presents with  . Follow-up    labs    HPI Patient here for lab review. Has been unable to get labs drawn due to not having an I.D. card but did have labs drawn on 11/28/2018 during a visit to emergency department for abdominal pain. Cholesterol and thyroid panel not obtained at E.D. visit, will reorder these panels for patient to have them drawn once I.D. card arrives. No acute issues today, abdominal pain resolved. Imaging revealed cholelithiasis without acute disease. Denies N/V or diarrhea since E.D. visit. Scheduled to further evaluate gallbladder tomorrow at hospital, being followed by general surgery in regard to removing gallbladder.    Current Medication: Outpatient Encounter Medications as of 12/23/2018  Medication Sig  . famotidine (PEPCID) 20 MG tablet Take 1 tablet (20 mg total) by mouth 2 (two) times daily.  . ondansetron (ZOFRAN ODT) 4 MG disintegrating tablet Take 1 tablet (4 mg total) by mouth every 8 (eight) hours as needed for nausea or vomiting.  . Paliperidone Palmitate (INVEGA SUSTENNA) 234 MG/1.5ML SUSP Inject 1 Syringe into the muscle every 30 (thirty) days.   No facility-administered encounter medications on file as of 12/23/2018.     Surgical History: Past Surgical History:  Procedure Laterality Date  . LEG SURGERY Left     Medical History: Past Medical History:  Diagnosis Date  . Schizoaffective disorder (HCC)     Family History: Family History  Family history unknown: Yes    Social History   Socioeconomic History  . Marital status: Single    Spouse name: Not on file  . Number of children: Not on file  . Years of education: Not on file  . Highest education level: Not on file  Occupational History  . Not on file  Social Needs  .  Financial resource strain: Not on file  . Food insecurity    Worry: Not on file    Inability: Not on file  . Transportation needs    Medical: Not on file    Non-medical: Not on file  Tobacco Use  . Smoking status: Current Every Day Smoker    Packs/day: 2.00    Types: Cigarettes  . Smokeless tobacco: Never Used  Substance and Sexual Activity  . Alcohol use: No  . Drug use: No  . Sexual activity: Not on file  Lifestyle  . Physical activity    Days per week: Not on file    Minutes per session: Not on file  . Stress: Not on file  Relationships  . Social Musician on phone: Not on file    Gets together: Not on file    Attends religious service: Not on file    Active member of club or organization: Not on file    Attends meetings of clubs or organizations: Not on file    Relationship status: Not on file  . Intimate partner violence    Fear of current or ex partner: Not on file    Emotionally abused: Not on file    Physically abused: Not on file    Forced sexual activity: Not on file  Other Topics Concern  . Not on file  Social History Narrative  . Not on file      Review of Systems  Constitutional: Negative.  Negative for chills, fatigue and unexpected weight change.  HENT: Negative.  Negative for congestion, rhinorrhea, sneezing and sore throat.   Eyes: Negative for redness.  Respiratory: Negative.  Negative for cough, chest tightness and shortness of breath.   Cardiovascular: Negative.  Negative for chest pain and palpitations.  Gastrointestinal: Negative.  Negative for abdominal pain, constipation, diarrhea, nausea and vomiting.  Endocrine: Negative.   Genitourinary: Negative.  Negative for dysuria and frequency.  Musculoskeletal: Negative.  Negative for arthralgias, back pain, joint swelling and neck pain.  Skin: Negative.  Negative for rash.  Allergic/Immunologic: Negative.   Neurological: Negative.  Negative for tremors and numbness.  Hematological:  Negative for adenopathy. Does not bruise/bleed easily.  Psychiatric/Behavioral: Negative.  Negative for behavioral problems, sleep disturbance and suicidal ideas. The patient is not nervous/anxious.       Vital Signs: BP 100/70   Pulse 79   Temp 97.6 F (36.4 C)   Resp 16   Ht 6' (1.829 m)   Wt 159 lb (72.1 kg)   SpO2 99%   BMI 21.56 kg/m    Physical Exam Vitals signs and nursing note reviewed.  Constitutional:      General: He is not in acute distress.    Appearance: He is well-developed. He is not diaphoretic.  HENT:     Head: Normocephalic and atraumatic.     Mouth/Throat:     Pharynx: No oropharyngeal exudate.  Eyes:     Pupils: Pupils are equal, round, and reactive to light.  Neck:     Musculoskeletal: Normal range of motion and neck supple.     Thyroid: No thyromegaly.     Vascular: No JVD.     Trachea: No tracheal deviation.  Cardiovascular:     Rate and Rhythm: Normal rate and regular rhythm.     Heart sounds: Normal heart sounds. No murmur. No friction rub. No gallop.   Pulmonary:     Effort: Pulmonary effort is normal. No respiratory distress.     Breath sounds: Normal breath sounds. No wheezing or rales.  Chest:     Chest wall: No tenderness.  Abdominal:     Palpations: Abdomen is soft.  Musculoskeletal: Normal range of motion.  Lymphadenopathy:     Cervical: No cervical adenopathy.  Skin:    General: Skin is warm and dry.  Neurological:     Mental Status: He is alert and oriented to person, place, and time.     Cranial Nerves: No cranial nerve deficit.  Psychiatric:        Behavior: Behavior normal.        Thought Content: Thought content normal.        Judgment: Judgment normal.     Assessment/Plan: 1. Schizoaffective disorder, unspecified type ([place]) Stable at this time.  Labs drawn and sent from office today.   - Lipid Panel With LDL/HDL Ratio - TSH - T4, free  2. Right upper quadrant abdominal pain Continues to be worked up by Surgery.   Denies any current abdominal pain, it has mostly resolved.   3. Smoking Smoking cessation counseling: 1. Pt acknowledges the risks of long term smoking, she will try to quite smoking. 2. Options for different medications including nicotine products, chewing gum, patch etc, Wellbutrin and Chantix is discussed 3. Goal and date of compete cessation is discussed 4. Total time spent in smoking cessation is 15 min.  4. Elevated serum creatinine Resolved at this time, continue to monitor.   General Counseling: warwick nick understanding  of the findings of todays visit and agrees with plan of treatment. I have discussed any further diagnostic evaluation that may be needed or ordered today. We also reviewed his medications today. he has been encouraged to call the office with any questions or concerns that should arise related to todays visit.    No orders of the defined types were placed in this encounter.   No orders of the defined types were placed in this encounter.   Time spent: 25 Minutes   This patient was seen by Blima LedgerAdam Ellie Spickler AGNP-C in Collaboration with Dr Lyndon CodeFozia M Khan as a part of collaborative care agreement     Johnna AcostaAdam J. Sarath Privott AGNP-C Internal medicine

## 2018-12-24 ENCOUNTER — Ambulatory Visit
Admission: RE | Admit: 2018-12-24 | Discharge: 2018-12-24 | Disposition: A | Discharge status: Home / Self Care | Payer: Medicaid Other | Source: Ambulatory Visit | Attending: Surgery | Admitting: Surgery

## 2018-12-24 ENCOUNTER — Other Ambulatory Visit: Payer: Self-pay

## 2018-12-24 ENCOUNTER — Other Ambulatory Visit: Payer: Self-pay | Admitting: Surgery

## 2018-12-24 DIAGNOSIS — K8050 Calculus of bile duct without cholangitis or cholecystitis without obstruction: Secondary | ICD-10-CM | POA: Insufficient documentation

## 2018-12-24 LAB — LIPID PANEL WITH LDL/HDL RATIO
Cholesterol, Total: 129 mg/dL (ref 100–199)
HDL: 33 mg/dL — ABNORMAL LOW (ref >39)
LDL Chol Calc (NIH): 82 mg/dL (ref 0–99)
LDL/HDL Ratio: 2.5 ratio (ref 0.0–3.6)
Triglycerides: 69 mg/dL (ref 0–149)
VLDL Cholesterol Cal: 14 mg/dL (ref 5–40)

## 2018-12-24 LAB — TSH: TSH: 1.05 u[IU]/mL (ref 0.450–4.500)

## 2018-12-24 LAB — T4, FREE: Free T4: 1.16 ng/dL (ref 0.82–1.77)

## 2018-12-24 MED ORDER — MORPHINE SULFATE (PF) 2 MG/ML IV SOLN
2.0000 mg | Freq: Once | INTRAVENOUS | Status: AC
  Administered 2018-12-24: 11:00:00 2 mg via INTRAVENOUS
  Filled 2018-12-24: qty 1

## 2018-12-24 MED ORDER — TECHNETIUM TC 99M MEBROFENIN IV KIT
1.9900 | PACK | Freq: Once | INTRAVENOUS | Status: AC | PRN
  Administered 2018-12-24: 11:00:00 1.99 via INTRAVENOUS

## 2018-12-24 MED ORDER — TECHNETIUM TC 99M MEBROFENIN IV KIT
5.4400 | PACK | Freq: Once | INTRAVENOUS | Status: AC | PRN
  Administered 2018-12-24: 5.44 via INTRAVENOUS

## 2018-12-24 NOTE — Progress Notes (Signed)
Patient here for nuclear med scan.  Dr. Golden Circle ordered 2mg  iv morphine for patient.  Vs obtained pre administration temp 97.6 oral, hr 52, bp 98/62, and spo2 100% on room air.  Morphine 2mg  iv administered in left ac #22 iv. Patient verbalized pre administration his mother was driving him home from procedure.  He has allergies only to sulfa drugs.  Phillips monitor set to record vs q41minutes. Nuclear medicine staff at bedside of patient.  Dr. Golden Circle to enter orders for monitoring post procedure

## 2019-02-10 ENCOUNTER — Telehealth: Payer: Self-pay

## 2019-02-10 NOTE — Telephone Encounter (Signed)
CONFIRMED AND SCREENED PATIENT FOR 02-12-19 OV. °

## 2019-02-12 ENCOUNTER — Other Ambulatory Visit: Payer: Self-pay

## 2019-02-12 ENCOUNTER — Encounter: Payer: Self-pay | Admitting: Adult Health

## 2019-02-12 ENCOUNTER — Ambulatory Visit: Payer: Medicaid Other | Admitting: Adult Health

## 2019-02-12 VITALS — BP 111/72 | HR 77 | Resp 16 | Ht 72.0 in | Wt 157.6 lb

## 2019-02-12 DIAGNOSIS — R7989 Other specified abnormal findings of blood chemistry: Secondary | ICD-10-CM

## 2019-02-12 DIAGNOSIS — F259 Schizoaffective disorder, unspecified: Secondary | ICD-10-CM

## 2019-02-12 DIAGNOSIS — F17200 Nicotine dependence, unspecified, uncomplicated: Secondary | ICD-10-CM

## 2019-02-12 DIAGNOSIS — R1011 Right upper quadrant pain: Secondary | ICD-10-CM

## 2019-02-12 NOTE — Progress Notes (Signed)
[facility] [address] [place], [place] [address]  Internal MEDICINE  Office Visit Note  Patient Name: Manuel ProvostBrian T Horn  [id number]  [id number]  Date of Service: 02/12/2019  Chief Complaint  Patient presents with  . Follow-up    review labs  . Schizophrenia  . Gastroesophageal Reflux    HPI  Pt is here for follow up and to review lab results. Overall he is doing fair at this time. We have reviewed his labs at this time and he denies questions. It appears that he followed up with surgery and a HIDA scan was performed.  It was unequivocal, and the surgeon encouraged that patients mother to call if there were continued symptoms.  Here in office today, Manuel Horn reports ongoing issues with nausea, and some abdominal pain. I gave the patient the phone number for the surgeons office, and asked him to call and tell them he continues to have symptoms.    Current Medication: Outpatient Encounter Medications as of 02/12/2019  Medication Sig  . famotidine (PEPCID) 20 MG tablet Take 1 tablet (20 mg total) by mouth 2 (two) times daily.  . ondansetron (ZOFRAN ODT) 4 MG disintegrating tablet Take 1 tablet (4 mg total) by mouth every 8 (eight) hours as needed for nausea or vomiting.  . Paliperidone Palmitate (INVEGA SUSTENNA) 234 MG/1.5ML SUSP Inject 1 Syringe into the muscle every 30 (thirty) days.   No facility-administered encounter medications on file as of 02/12/2019.     Surgical History: Past Surgical History:  Procedure Laterality Date  . LEG SURGERY Left     Medical History: Past Medical History:  Diagnosis Date  . Schizoaffective disorder (HCC)     Family History: Family History  Family history unknown: Yes    Social History   Socioeconomic History  . Marital status: Single    Spouse name: Not on file  . Number of children: Not on file  . Years of education: Not on file  . Highest education level: Not on file  Occupational History  . Not on file   Social Needs  . Financial resource strain: Not on file  . Food insecurity    Worry: Not on file    Inability: Not on file  . Transportation needs    Medical: Not on file    Non-medical: Not on file  Tobacco Use  . Smoking status: Current Every Day Smoker    Packs/day: 2.00    Types: Cigarettes  . Smokeless tobacco: Never Used  Substance and Sexual Activity  . Alcohol use: No  . Drug use: No  . Sexual activity: Not on file  Lifestyle  . Physical activity    Days per week: Not on file    Minutes per session: Not on file  . Stress: Not on file  Relationships  . Social Musicianconnections    Talks on phone: Not on file    Gets together: Not on file    Attends religious service: Not on file    Active member of club or organization: Not on file    Attends meetings of clubs or organizations: Not on file    Relationship status: Not on file  . Intimate partner violence    Fear of current or ex partner: Not on file    Emotionally abused: Not on file    Physically abused: Not on file    Forced sexual activity: Not on file  Other Topics Concern  . Not on file  Social History Narrative  .  Not on file      Review of Systems  Constitutional: Negative.  Negative for chills, fatigue and unexpected weight change.  HENT: Negative.  Negative for congestion, rhinorrhea, sneezing and sore throat.   Eyes: Negative for redness.  Respiratory: Negative.  Negative for cough, chest tightness and shortness of breath.   Cardiovascular: Negative.  Negative for chest pain and palpitations.  Gastrointestinal: Positive for abdominal pain and nausea. Negative for constipation, diarrhea and vomiting.  Endocrine: Negative.   Genitourinary: Negative.  Negative for dysuria and frequency.  Musculoskeletal: Negative.  Negative for arthralgias, back pain, joint swelling and neck pain.  Skin: Negative.  Negative for rash.  Allergic/Immunologic: Negative.   Neurological: Negative.  Negative for tremors and  numbness.  Hematological: Negative for adenopathy. Does not bruise/bleed easily.  Psychiatric/Behavioral: Negative.  Negative for behavioral problems, sleep disturbance and suicidal ideas. The patient is not nervous/anxious.     Vital Signs: BP 111/72   Pulse 77   Resp 16   Ht 6' (1.829 m)   Wt 157 lb 9.6 oz (71.5 kg)   SpO2 98%   BMI 21.37 kg/m    Physical Exam Vitals signs and nursing note reviewed.  Constitutional:      General: He is not in acute distress.    Appearance: He is well-developed. He is not diaphoretic.  HENT:     Head: Normocephalic and atraumatic.     Mouth/Throat:     Pharynx: No oropharyngeal exudate.  Eyes:     Pupils: Pupils are equal, round, and reactive to light.  Neck:     Musculoskeletal: Normal range of motion and neck supple.     Thyroid: No thyromegaly.     Vascular: No JVD.     Trachea: No tracheal deviation.  Cardiovascular:     Rate and Rhythm: Normal rate and regular rhythm.     Heart sounds: Normal heart sounds. No murmur. No friction rub. No gallop.   Pulmonary:     Effort: Pulmonary effort is normal. No respiratory distress.     Breath sounds: Normal breath sounds. No wheezing or rales.  Chest:     Chest wall: No tenderness.  Abdominal:     Palpations: Abdomen is soft.     Tenderness: There is no abdominal tenderness. There is no guarding.  Musculoskeletal: Normal range of motion.  Lymphadenopathy:     Cervical: No cervical adenopathy.  Skin:    General: Skin is warm and dry.  Neurological:     Mental Status: He is alert and oriented to person, place, and time.     Cranial Nerves: No cranial nerve deficit.  Psychiatric:        Behavior: Behavior normal.        Thought Content: Thought content normal.        Judgment: Judgment normal.    Assessment/Plan: 1. Schizoaffective disorder, unspecified type (HCC) Controlled currently, continue to see psych as scheduled.   2. Right upper quadrant abdominal pain Ongoing  discomfort, HIDA negative.  Pt needs to follow up with surgery as discussed.   3. Elevated serum creatinine Resolved, most recent creatinine in September 2020 was 1.04  4. Smoking Smoking cessation counseling: 1. Pt acknowledges the risks of long term smoking, she will try to quite smoking. 2. Options for different medications including nicotine products, chewing gum, patch etc, Wellbutrin and Chantix is discussed 3. Goal and date of compete cessation is discussed 4. Total time spent in smoking cessation is 15 min.  General Counseling: Manuel Horn understanding of the findings of todays visit and agrees with plan of treatment. I have discussed any further diagnostic evaluation that may be needed or ordered today. We also reviewed his medications today. he has been encouraged to call the office with any questions or concerns that should arise related to todays visit.    No orders of the defined types were placed in this encounter.   No orders of the defined types were placed in this encounter.   Time spent: 25 Minutes   This patient was seen by Orson Gear AGNP-C in Collaboration with Dr Lavera Guise as a part of collaborative care agreement     Kendell Bane AGNP-C Internal medicine

## 2019-06-10 ENCOUNTER — Telehealth: Payer: Self-pay

## 2019-06-10 NOTE — Telephone Encounter (Signed)
Called lmom informing patient of appointment on 06/12/2019. klh 

## 2019-06-12 ENCOUNTER — Other Ambulatory Visit: Payer: Self-pay

## 2019-06-12 ENCOUNTER — Ambulatory Visit: Payer: Medicaid Other | Admitting: Adult Health

## 2019-06-12 ENCOUNTER — Encounter: Payer: Self-pay | Admitting: Adult Health

## 2019-06-12 VITALS — BP 121/71 | HR 81 | Temp 96.6°F | Resp 16 | Ht 72.0 in | Wt 150.8 lb

## 2019-06-12 DIAGNOSIS — K219 Gastro-esophageal reflux disease without esophagitis: Secondary | ICD-10-CM | POA: Diagnosis not present

## 2019-06-12 DIAGNOSIS — R1011 Right upper quadrant pain: Secondary | ICD-10-CM | POA: Diagnosis not present

## 2019-06-12 DIAGNOSIS — F17200 Nicotine dependence, unspecified, uncomplicated: Secondary | ICD-10-CM | POA: Diagnosis not present

## 2019-06-12 DIAGNOSIS — F259 Schizoaffective disorder, unspecified: Secondary | ICD-10-CM

## 2019-06-12 NOTE — Progress Notes (Signed)
[facility] [address] [place], [place] [address]  Internal MEDICINE  Office Visit Note  Patient Name: Manuel Horn  [id number]  [id number]  Date of Service: 06/12/2019  Chief Complaint  Patient presents with  . Schizophrenia  . Gastroesophageal Reflux    HPI Patient is here for routine follow-up on his schizophrenia and GERD. He seems well controlled at this time on Invega and famotidine. He is followed by Evlyn Clines for his Invega injections every month. His last several visits he was complaining of abdominal pain associated with N/V. He was evaluated by general surgery, HIDA scan negative but encouraged to call back with persistent symptoms. He states he is still having the discomfort but not as often and the nausea comes and goes. He has not reached back out to general surgery at this time. I have encouraged him to call back the office and schedule follow-up appointment with surgeon. No acute issues to discuss today. Denies chest pain, shortness of breath or headaches.  Current Medication: Outpatient Encounter Medications as of 06/12/2019  Medication Sig  . famotidine (PEPCID) 20 MG tablet Take 1 tablet (20 mg total) by mouth 2 (two) times daily.  . ondansetron (ZOFRAN ODT) 4 MG disintegrating tablet Take 1 tablet (4 mg total) by mouth every 8 (eight) hours as needed for nausea or vomiting.  . Paliperidone Palmitate (INVEGA SUSTENNA) 234 MG/1.5ML SUSP Inject 1 Syringe into the muscle every 30 (thirty) days.   No facility-administered encounter medications on file as of 06/12/2019.    Surgical History: Past Surgical History:  Procedure Laterality Date  . LEG SURGERY Left     Medical History: Past Medical History:  Diagnosis Date  . Schizoaffective disorder (HCC)     Family History: Family History  Family history unknown: Yes    Social History   Socioeconomic History  . Marital status: Single    Spouse name: Not on file  . Number of children: Not on  file  . Years of education: Not on file  . Highest education level: Not on file  Occupational History  . Not on file  Tobacco Use  . Smoking status: Current Every Day Smoker    Packs/day: 2.00    Types: Cigarettes  . Smokeless tobacco: Never Used  Substance and Sexual Activity  . Alcohol use: No  . Drug use: No  . Sexual activity: Not on file  Other Topics Concern  . Not on file  Social History Narrative  . Not on file   Social Determinants of Health   Financial Resource Strain:   . Difficulty of Paying Living Expenses:   Food Insecurity:   . Worried About Programme researcher, broadcasting/film/video in the Last Year:   . Barista in the Last Year:   Transportation Needs:   . Freight forwarder (Medical):   Marland Kitchen Lack of Transportation (Non-Medical):   Physical Activity:   . Days of Exercise per Week:   . Minutes of Exercise per Session:   Stress:   . Feeling of Stress :   Social Connections:   . Frequency of Communication with Friends and Family:   . Frequency of Social Gatherings with Friends and Family:   . Attends Religious Services:   . Active Member of Clubs or Organizations:   . Attends Banker Meetings:   Marland Kitchen Marital Status:   Intimate Partner Violence:   . Fear of Current or Ex-Partner:   . Emotionally Abused:   Marland Kitchen Physically Abused:   .  Sexually Abused:       Review of Systems  Constitutional: Negative.  Negative for chills, fatigue and unexpected weight change.  HENT: Negative.  Negative for congestion, rhinorrhea, sneezing and sore throat.   Eyes: Negative for redness.  Respiratory: Negative.  Negative for cough, chest tightness and shortness of breath.   Cardiovascular: Negative.  Negative for chest pain and palpitations.  Gastrointestinal: Negative.  Negative for abdominal pain, constipation, diarrhea, nausea and vomiting.  Endocrine: Negative.   Genitourinary: Negative.  Negative for dysuria and frequency.  Musculoskeletal: Negative.  Negative for  arthralgias, back pain, joint swelling and neck pain.  Skin: Negative.  Negative for rash.  Allergic/Immunologic: Negative.   Neurological: Negative.  Negative for tremors and numbness.  Hematological: Negative for adenopathy. Does not bruise/bleed easily.  Psychiatric/Behavioral: Negative.  Negative for behavioral problems, sleep disturbance and suicidal ideas. The patient is not nervous/anxious.     Vital Signs: BP 121/71   Pulse 81   Temp (!) 96.6 F (35.9 C)   Resp 16   Ht 6' (1.829 m)   Wt 150 lb 12.8 oz (68.4 kg)   SpO2 98%   BMI 20.45 kg/m    Physical Exam Vitals and nursing note reviewed.  Constitutional:      General: He is not in acute distress.    Appearance: He is well-developed. He is not diaphoretic.  HENT:     Head: Normocephalic and atraumatic.     Mouth/Throat:     Pharynx: No oropharyngeal exudate.  Eyes:     Pupils: Pupils are equal, round, and reactive to light.  Neck:     Thyroid: No thyromegaly.     Vascular: No JVD.     Trachea: No tracheal deviation.  Cardiovascular:     Rate and Rhythm: Normal rate and regular rhythm.     Heart sounds: Normal heart sounds. No murmur. No friction rub. No gallop.   Pulmonary:     Effort: Pulmonary effort is normal. No respiratory distress.     Breath sounds: Normal breath sounds. No wheezing or rales.  Chest:     Chest wall: No tenderness.  Abdominal:     Palpations: Abdomen is soft.  Musculoskeletal:        General: Normal range of motion.     Cervical back: Normal range of motion and neck supple.  Lymphadenopathy:     Cervical: No cervical adenopathy.  Skin:    General: Skin is warm and dry.  Neurological:     Mental Status: He is alert and oriented to person, place, and time.     Cranial Nerves: No cranial nerve deficit.  Psychiatric:        Behavior: Behavior normal.        Thought Content: Thought content normal.        Judgment: Judgment normal.     Assessment/Plan: 1. Right upper quadrant  abdominal pain Continues to have pain/discomfort in this area. Was evaluated by general surgery but would like to hold off on follow-up at this time.   2. Schizoaffective disorder, unspecified type ([place]) Followed by psychiatry, continue to follow-up. Appears stable at this time.  3. Smoking Smoking cessation counseling: 1. Pt acknowledges the risks of long term smoking, she will try to quite smoking. 2. Options for different medications including nicotine products, chewing gum, patch etc, Wellbutrin and Chantix is discussed 3. Goal and date of compete cessation is discussed 4. Total time spent in smoking cessation is 15 min.   4.  Gastroesophageal reflux disease without esophagitis Stable at this time, continue to monitor.   General Counseling: demani mcbrien understanding of the findings of todays visit and agrees with plan of treatment. I have discussed any further diagnostic evaluation that may be needed or ordered today. We also reviewed his medications today. he has been encouraged to call the office with any questions or concerns that should arise related to todays visit.    No orders of the defined types were placed in this encounter.   No orders of the defined types were placed in this encounter.   Time spent: 30 Minutes   This patient was seen by Blima Ledger AGNP-C in Collaboration with Dr Lyndon Code as a part of collaborative care agreement     Johnna Acosta AGNP-C Internal medicine

## 2019-11-06 ENCOUNTER — Telehealth: Payer: Self-pay

## 2019-11-06 NOTE — Telephone Encounter (Signed)
LMO for office visit on 8/23

## 2019-11-10 ENCOUNTER — Other Ambulatory Visit: Payer: Self-pay

## 2019-11-10 ENCOUNTER — Ambulatory Visit: Payer: Medicaid Other | Admitting: Adult Health

## 2019-11-10 ENCOUNTER — Encounter: Payer: Self-pay | Admitting: Adult Health

## 2019-11-10 VITALS — BP 96/70 | HR 74 | Temp 97.3°F | Resp 16 | Ht 72.0 in | Wt 159.2 lb

## 2019-11-10 DIAGNOSIS — Z1159 Encounter for screening for other viral diseases: Secondary | ICD-10-CM

## 2019-11-10 DIAGNOSIS — F259 Schizoaffective disorder, unspecified: Secondary | ICD-10-CM

## 2019-11-10 DIAGNOSIS — R7989 Other specified abnormal findings of blood chemistry: Secondary | ICD-10-CM | POA: Diagnosis not present

## 2019-11-10 DIAGNOSIS — K219 Gastro-esophageal reflux disease without esophagitis: Secondary | ICD-10-CM

## 2019-11-10 DIAGNOSIS — F17210 Nicotine dependence, cigarettes, uncomplicated: Secondary | ICD-10-CM

## 2019-11-10 DIAGNOSIS — Z0001 Encounter for general adult medical examination with abnormal findings: Secondary | ICD-10-CM | POA: Diagnosis not present

## 2019-11-10 DIAGNOSIS — R300 Dysuria: Secondary | ICD-10-CM

## 2019-11-10 DIAGNOSIS — Z114 Encounter for screening for human immunodeficiency virus [HIV]: Secondary | ICD-10-CM

## 2019-11-10 DIAGNOSIS — Z79899 Other long term (current) drug therapy: Secondary | ICD-10-CM

## 2019-11-10 NOTE — Progress Notes (Signed)
[facility] [address] [place], [place] [address]  Internal MEDICINE  Office Visit Note  Patient Name: Manuel Horn  [id number]  [id number]  Date of Service: 11/10/2019  Chief Complaint  Patient presents with  . Annual Exam  . Quality Metric Gaps    Hep C screen, HIV screen, flu vaccine     HPI Pt is here for routine health maintenance examination.  Pt is a well appearing 39 yo AA male.  He has a history of schizoaffective disorder,  Genella Rife, and elevated creatinine.  He has not had his labs drawn yet.  Will send orders today.  He denies any complaints.  He unfortunately continues to smoke 1 PPD of cigarettes.  He denies any illicit drug use, or alcohol consumption.   Current Medication: Outpatient Encounter Medications as of 11/10/2019  Medication Sig  . famotidine (PEPCID) 20 MG tablet Take 1 tablet (20 mg total) by mouth 2 (two) times daily.  . ondansetron (ZOFRAN ODT) 4 MG disintegrating tablet Take 1 tablet (4 mg total) by mouth every 8 (eight) hours as needed for nausea or vomiting.  . Paliperidone Palmitate (INVEGA SUSTENNA) 234 MG/1.5ML SUSP Inject 1 Syringe into the muscle every 30 (thirty) days.   No facility-administered encounter medications on file as of 11/10/2019.    Surgical History: Past Surgical History:  Procedure Laterality Date  . LEG SURGERY Left     Medical History: Past Medical History:  Diagnosis Date  . Schizoaffective disorder (HCC)     Family History: Family History  Family history unknown: Yes      Review of Systems  Constitutional: Negative.  Negative for chills, fatigue and unexpected weight change.  HENT: Negative.  Negative for congestion, rhinorrhea, sneezing and sore throat.   Eyes: Negative for redness.  Respiratory: Negative.  Negative for cough, chest tightness and shortness of breath.   Cardiovascular: Negative.  Negative for chest pain and palpitations.  Gastrointestinal: Negative.  Negative for abdominal  pain, constipation, diarrhea, nausea and vomiting.  Endocrine: Negative.   Genitourinary: Negative.  Negative for dysuria and frequency.  Musculoskeletal: Negative.  Negative for arthralgias, back pain, joint swelling and neck pain.  Skin: Negative.  Negative for rash.  Allergic/Immunologic: Negative.   Neurological: Negative.  Negative for tremors and numbness.  Hematological: Negative for adenopathy. Does not bruise/bleed easily.  Psychiatric/Behavioral: Negative.  Negative for behavioral problems, sleep disturbance and suicidal ideas. The patient is not nervous/anxious.      Vital Signs: BP 96/70   Pulse 74   Temp (!) 97.3 F (36.3 C)   Resp 16   Ht 6' (1.829 m)   Wt 159 lb 3.2 oz (72.2 kg)   SpO2 98%   BMI 21.59 kg/m    Physical Exam Vitals and nursing note reviewed.  Constitutional:      General: He is not in acute distress.    Appearance: He is well-developed. He is not diaphoretic.  HENT:     Head: Normocephalic and atraumatic.     Mouth/Throat:     Pharynx: No oropharyngeal exudate.  Eyes:     Pupils: Pupils are equal, round, and reactive to light.  Neck:     Thyroid: No thyromegaly.     Vascular: No JVD.     Trachea: No tracheal deviation.  Cardiovascular:     Rate and Rhythm: Normal rate and regular rhythm.     Heart sounds: Normal heart sounds. No murmur heard.  No friction rub. No gallop.   Pulmonary:  Effort: Pulmonary effort is normal. No respiratory distress.     Breath sounds: Normal breath sounds. No wheezing or rales.  Chest:     Chest wall: No tenderness.  Abdominal:     Palpations: Abdomen is soft.     Tenderness: There is no abdominal tenderness. There is no guarding.  Musculoskeletal:        General: Normal range of motion.     Cervical back: Normal range of motion and neck supple.  Lymphadenopathy:     Cervical: No cervical adenopathy.  Skin:    General: Skin is warm and dry.  Neurological:     Mental Status: He is alert and  oriented to person, place, and time.     Cranial Nerves: No cranial nerve deficit.  Psychiatric:        Behavior: Behavior normal.        Thought Content: Thought content normal.        Judgment: Judgment normal.      LABS: No results found for this or any previous visit (from the past 2160 hour(s)).   Assessment/Plan: 1. Encounter for general adult medical examination with abnormal findings Up to date on PHM.   Will review labs when results available.  - CBC with Differential/Platelet - Lipid Panel With LDL/HDL Ratio - TSH - T4, free - Comprehensive metabolic panel - Urinalysis  2. Schizoaffective disorder, unspecified type (HCC) Stable, continue to follow up with [facility] as directed.   3. Gastroesophageal reflux disease without esophagitis Stable, continue current management.   4. Elevated serum creatinine Recheck labs for surveillance.   5. Cigarette nicotine dependence without complication Smoking cessation counseling: 1. Pt acknowledges the risks of long term smoking, she will try to quite smoking. 2. Options for different medications including nicotine products, chewing gum, patch etc, Wellbutrin and Chantix is discussed 3. Goal and date of compete cessation is discussed 4. Total time spent in smoking cessation is 15 min.  6. Long-term use of high-risk medication - CBC with Differential/Platelet - Lipid Panel With LDL/HDL Ratio - TSH - T4, free - Comprehensive metabolic panel - Urinalysis  7. Encounter for hepatitis C screening test for low risk patient - Hepatitis C Antibody  8. Screening for HIV (human immunodeficiency virus) - HIV antibody (with reflex)  9. Dysuria - UA/M w/rflx Culture, Routine  General Counseling: Meziah verbalizes understanding of the findings of todays visit and agrees with plan of treatment. I have discussed any further diagnostic evaluation that may be needed or ordered today. We also reviewed his medications today. he has been  encouraged to call the office with any questions or concerns that should arise related to todays visit.   Orders Placed This Encounter  Procedures  . UA/M w/rflx Culture, Routine    No orders of the defined types were placed in this encounter.   Time spent: 30 Minutes   This patient was seen by Blima Ledger AGNP-C in Collaboration with Dr Lyndon Code as a part of collaborative care agreement    Johnna Acosta AGNP-C Internal Medicine

## 2019-11-11 LAB — URINALYSIS
Bilirubin, UA: NEGATIVE
Glucose, UA: NEGATIVE
Nitrite, UA: NEGATIVE
RBC, UA: NEGATIVE
Specific Gravity, UA: 1.021 (ref 1.005–1.030)
Urobilinogen, Ur: 1 mg/dL (ref 0.2–1.0)
pH, UA: 6 (ref 5.0–7.5)

## 2019-11-27 LAB — COMPREHENSIVE METABOLIC PANEL
ALT: 13 IU/L (ref 0–44)
AST: 18 IU/L (ref 0–40)
Albumin/Globulin Ratio: 1.3 (ref 1.2–2.2)
Albumin: 4.2 g/dL (ref 4.0–5.0)
Alkaline Phosphatase: 90 IU/L (ref 48–121)
BUN/Creatinine Ratio: 6 — ABNORMAL LOW (ref 9–20)
BUN: 8 mg/dL (ref 6–20)
Bilirubin Total: 1 mg/dL (ref 0.0–1.2)
CO2: 25 mmol/L (ref 20–29)
Calcium: 10 mg/dL (ref 8.7–10.2)
Chloride: 102 mmol/L (ref 96–106)
Creatinine, Ser: 1.24 mg/dL (ref 0.76–1.27)
GFR calc Af Amer: 84 mL/min/{1.73_m2} (ref >59)
GFR calc non Af Amer: 73 mL/min/{1.73_m2} (ref >59)
Globulin, Total: 3.3 g/dL (ref 1.5–4.5)
Glucose: 108 mg/dL — ABNORMAL HIGH (ref 65–99)
Potassium: 4.8 mmol/L (ref 3.5–5.2)
Sodium: 142 mmol/L (ref 134–144)
Total Protein: 7.5 g/dL (ref 6.0–8.5)

## 2019-11-27 LAB — HIV ANTIBODY (ROUTINE TESTING W REFLEX): HIV Screen 4th Generation wRfx: NONREACTIVE

## 2019-11-27 LAB — CBC WITH DIFFERENTIAL/PLATELET
Basophils Absolute: 0 10*3/uL (ref 0.0–0.2)
Basos: 0 %
EOS (ABSOLUTE): 0.2 10*3/uL (ref 0.0–0.4)
Eos: 1 %
Hematocrit: 41.5 % (ref 37.5–51.0)
Hemoglobin: 14.3 g/dL (ref 13.0–17.7)
Immature Grans (Abs): 0 10*3/uL (ref 0.0–0.1)
Immature Granulocytes: 0 %
Lymphocytes Absolute: 4.6 10*3/uL — ABNORMAL HIGH (ref 0.7–3.1)
Lymphs: 39 %
MCH: 33.6 pg — ABNORMAL HIGH (ref 26.6–33.0)
MCHC: 34.5 g/dL (ref 31.5–35.7)
MCV: 98 fL — ABNORMAL HIGH (ref 79–97)
Monocytes Absolute: 0.6 10*3/uL (ref 0.1–0.9)
Monocytes: 5 %
Neutrophils Absolute: 6.5 10*3/uL (ref 1.4–7.0)
Neutrophils: 55 %
Platelets: 212 10*3/uL (ref 150–450)
RBC: 4.25 x10E6/uL (ref 4.14–5.80)
RDW: 12.2 % (ref 11.6–15.4)
WBC: 12 10*3/uL — ABNORMAL HIGH (ref 3.4–10.8)

## 2019-11-27 LAB — T4, FREE: Free T4: 1.13 ng/dL (ref 0.82–1.77)

## 2019-11-27 LAB — LIPID PANEL WITH LDL/HDL RATIO
Cholesterol, Total: 137 mg/dL (ref 100–199)
HDL: 30 mg/dL — ABNORMAL LOW (ref >39)
LDL Chol Calc (NIH): 90 mg/dL (ref 0–99)
LDL/HDL Ratio: 3 ratio (ref 0.0–3.6)
Triglycerides: 85 mg/dL (ref 0–149)
VLDL Cholesterol Cal: 17 mg/dL (ref 5–40)

## 2019-11-27 LAB — TSH: TSH: 1.56 u[IU]/mL (ref 0.450–4.500)

## 2019-11-27 LAB — HEPATITIS C ANTIBODY: Hep C Virus Ab: <0.1 s/co ratio (ref 0.0–0.9)

## 2019-11-29 LAB — B12 AND FOLATE PANEL
Folate: 4.9 ng/mL (ref >3.0)
Vitamin B-12: 374 pg/mL (ref 232–1245)

## 2019-11-29 LAB — UA/M W/RFLX CULTURE, ROUTINE
Bilirubin, UA: NEGATIVE
Glucose, UA: NEGATIVE
Nitrite, UA: NEGATIVE
Protein,UA: NEGATIVE
Specific Gravity, UA: 1.019 (ref 1.005–1.030)
Urobilinogen, Ur: 1 mg/dL (ref 0.2–1.0)
pH, UA: 6 (ref 5.0–7.5)

## 2019-11-29 LAB — MICROSCOPIC EXAMINATION
Bacteria, UA: NONE SEEN
Casts: NONE SEEN /lpf
Epithelial Cells (non renal): NONE SEEN /hpf (ref 0–10)
WBC, UA: >30 /hpf — AB (ref 0–5)

## 2019-11-29 LAB — URINE CULTURE, REFLEX

## 2019-11-29 LAB — FERRITIN: Ferritin: 248 ng/mL (ref 30–400)

## 2019-11-29 LAB — SPECIMEN STATUS REPORT

## 2019-11-29 LAB — IRON: Iron: 135 ug/dL (ref 38–169)

## 2019-12-05 ENCOUNTER — Telehealth: Payer: Self-pay

## 2019-12-05 NOTE — Telephone Encounter (Signed)
Faxed requested medical records to [facility] at [phone]. Placed copy of request in hold at front desk. Toni Amend

## 2019-12-22 ENCOUNTER — Other Ambulatory Visit: Payer: Self-pay

## 2019-12-22 ENCOUNTER — Ambulatory Visit: Payer: Medicaid Other | Admitting: Adult Health

## 2019-12-22 ENCOUNTER — Encounter: Payer: Self-pay | Admitting: Adult Health

## 2019-12-22 VITALS — BP 98/64 | HR 77 | Temp 97.8°F | Resp 16 | Ht 72.0 in | Wt 159.4 lb

## 2019-12-22 DIAGNOSIS — Z23 Encounter for immunization: Secondary | ICD-10-CM

## 2019-12-22 DIAGNOSIS — R7989 Other specified abnormal findings of blood chemistry: Secondary | ICD-10-CM

## 2019-12-22 DIAGNOSIS — D7219 Other eosinophilia: Secondary | ICD-10-CM | POA: Diagnosis not present

## 2019-12-22 DIAGNOSIS — R7303 Prediabetes: Secondary | ICD-10-CM | POA: Diagnosis not present

## 2019-12-22 DIAGNOSIS — R7301 Impaired fasting glucose: Secondary | ICD-10-CM | POA: Diagnosis not present

## 2019-12-22 LAB — POCT GLYCOSYLATED HEMOGLOBIN (HGB A1C): Hemoglobin A1C: 5.7 % — AB (ref 4.0–5.6)

## 2019-12-22 NOTE — Progress Notes (Signed)
[facility] [address] [place], [place] [address]  Internal MEDICINE  Office Visit Note  Patient Name: Manuel Horn  [id number]  [id number]  Date of Service: 12/22/2019  Chief Complaint  Patient presents with  . Follow-up    stomach hurts, diarrhea,     HPI  PT is here for follow up on labs. He reports he was fasting for his labs.  His creatinine is back to baseline.  His blood sugar was slightly elevated at 108 mg/dl.  He does have an elevated WBC count.  These labs were drawn one month ago.  He reports an episode last week of diarrhea, and vomiting x 1 day after eating chicken salad.  He feels better now, and denies any loose stool, vomiting or abdominal cramping.    Current Medication: Outpatient Encounter Medications as of 12/22/2019  Medication Sig  . Paliperidone Palmitate (INVEGA SUSTENNA) 234 MG/1.5ML SUSP Inject 1 Syringe into the muscle every 30 (thirty) days.  . famotidine (PEPCID) 20 MG tablet Take 1 tablet (20 mg total) by mouth 2 (two) times daily. (Patient not taking: Reported on 12/22/2019)  . ondansetron (ZOFRAN ODT) 4 MG disintegrating tablet Take 1 tablet (4 mg total) by mouth every 8 (eight) hours as needed for nausea or vomiting. (Patient not taking: Reported on 12/22/2019)   No facility-administered encounter medications on file as of 12/22/2019.    Surgical History: Past Surgical History:  Procedure Laterality Date  . LEG SURGERY Left     Medical History: Past Medical History:  Diagnosis Date  . Schizoaffective disorder (HCC)     Family History: Family History  Problem Relation Age of Onset  . Diabetes Mother   . Hypertension Mother     Social History   Socioeconomic History  . Marital status: Single    Spouse name: Not on file  . Number of children: Not on file  . Years of education: Not on file  . Highest education level: Not on file  Occupational History  . Not on file  Tobacco Use  . Smoking status: Current Every Day  Smoker    Packs/day: 2.00    Types: Cigarettes  . Smokeless tobacco: Never Used  Vaping Use  . Vaping Use: Never used  Substance and Sexual Activity  . Alcohol use: No  . Drug use: No  . Sexual activity: Not on file  Other Topics Concern  . Not on file  Social History Narrative  . Not on file   Social Determinants of Health   Financial Resource Strain:   . Difficulty of Paying Living Expenses: Not on file  Food Insecurity:   . Worried About Programme researcher, broadcasting/film/video in the Last Year: Not on file  . Ran Out of Food in the Last Year: Not on file  Transportation Needs:   . Lack of Transportation (Medical): Not on file  . Lack of Transportation (Non-Medical): Not on file  Physical Activity:   . Days of Exercise per Week: Not on file  . Minutes of Exercise per Session: Not on file  Stress:   . Feeling of Stress : Not on file  Social Connections:   . Frequency of Communication with Friends and Family: Not on file  . Frequency of Social Gatherings with Friends and Family: Not on file  . Attends Religious Services: Not on file  . Active Member of Clubs or Organizations: Not on file  . Attends Banker Meetings: Not on file  . Marital Status:  Not on file  Intimate Partner Violence:   . Fear of Current or Ex-Partner: Not on file  . Emotionally Abused: Not on file  . Physically Abused: Not on file  . Sexually Abused: Not on file      Review of Systems  Constitutional: Negative.  Negative for chills, fatigue and unexpected weight change.  HENT: Negative.  Negative for congestion, rhinorrhea, sneezing and sore throat.   Eyes: Negative for redness.  Respiratory: Negative.  Negative for cough, chest tightness and shortness of breath.   Cardiovascular: Negative.  Negative for chest pain and palpitations.  Gastrointestinal: Negative.  Negative for abdominal pain, constipation, diarrhea, nausea and vomiting.  Endocrine: Negative.   Genitourinary: Negative.  Negative for  dysuria and frequency.  Musculoskeletal: Negative.  Negative for arthralgias, back pain, joint swelling and neck pain.  Skin: Negative.  Negative for rash.  Allergic/Immunologic: Negative.   Neurological: Negative.  Negative for tremors and numbness.  Hematological: Negative for adenopathy. Does not bruise/bleed easily.  Psychiatric/Behavioral: Negative.  Negative for behavioral problems, sleep disturbance and suicidal ideas. The patient is not nervous/anxious.     Vital Signs: BP 98/64   Temp 97.8 F (36.6 C)   Resp 16   Ht 6' (1.829 m)   Wt 159 lb 6.4 oz (72.3 kg)   BMI 21.62 kg/m    Physical Exam Vitals and nursing note reviewed.  Constitutional:      General: He is not in acute distress.    Appearance: He is well-developed. He is not diaphoretic.  HENT:     Head: Normocephalic and atraumatic.     Mouth/Throat:     Pharynx: No oropharyngeal exudate.  Eyes:     Pupils: Pupils are equal, round, and reactive to light.  Neck:     Thyroid: No thyromegaly.     Vascular: No JVD.     Trachea: No tracheal deviation.  Cardiovascular:     Rate and Rhythm: Normal rate and regular rhythm.     Heart sounds: Normal heart sounds. No murmur heard.  No friction rub. No gallop.   Pulmonary:     Effort: Pulmonary effort is normal. No respiratory distress.     Breath sounds: Normal breath sounds. No wheezing or rales.  Chest:     Chest wall: No tenderness.  Abdominal:     Palpations: Abdomen is soft.     Tenderness: There is no abdominal tenderness. There is no guarding.  Musculoskeletal:        General: Normal range of motion.     Cervical back: Normal range of motion and neck supple.  Lymphadenopathy:     Cervical: No cervical adenopathy.  Skin:    General: Skin is warm and dry.  Neurological:     Mental Status: He is alert and oriented to person, place, and time.     Cranial Nerves: No cranial nerve deficit.  Psychiatric:        Behavior: Behavior normal.        Thought  Content: Thought content normal.        Judgment: Judgment normal.    Assessment/Plan: 1. Other eosinophilia Recheck labs before next visit.  - CBC w/Diff/Platelet  2. Pre-diabetes A1C 5.7 continue to monitor at future visit.  Discussed dietary and lifestyle modifications.  - POCT HgB A1C  3. Flu vaccine need - Flu Vaccine MDCK QUAD PF  4. Elevated serum creatinine Remains WNL at this time.  Continue to follow.   General Counseling: royal vandevoort understanding  of the findings of todays visit and agrees with plan of treatment. I have discussed any further diagnostic evaluation that may be needed or ordered today. We also reviewed his medications today. he has been encouraged to call the office with any questions or concerns that should arise related to todays visit.    Orders Placed This Encounter  Procedures  . Flu Vaccine MDCK QUAD PF  . POCT HgB A1C    No orders of the defined types were placed in this encounter.   Time spent: 25 Minutes   This patient was seen by Blima Ledger AGNP-C in Collaboration with Dr Lyndon Code as a part of collaborative care agreement     Johnna Acosta AGNP-C Internal medicine

## 2019-12-27 LAB — CBC WITH DIFFERENTIAL/PLATELET
Basophils Absolute: 0.1 10*3/uL (ref 0.0–0.2)
Basos: 0 %
EOS (ABSOLUTE): 0.2 10*3/uL (ref 0.0–0.4)
Eos: 1 %
Hematocrit: 35.9 % — ABNORMAL LOW (ref 37.5–51.0)
Hemoglobin: 12.2 g/dL — ABNORMAL LOW (ref 13.0–17.7)
Immature Grans (Abs): 0 10*3/uL (ref 0.0–0.1)
Immature Granulocytes: 0 %
Lymphocytes Absolute: 5 10*3/uL — ABNORMAL HIGH (ref 0.7–3.1)
Lymphs: 43 %
MCH: 34 pg — ABNORMAL HIGH (ref 26.6–33.0)
MCHC: 34 g/dL (ref 31.5–35.7)
MCV: 100 fL — ABNORMAL HIGH (ref 79–97)
Monocytes Absolute: 0.4 10*3/uL (ref 0.1–0.9)
Monocytes: 4 %
Neutrophils Absolute: 5.9 10*3/uL (ref 1.4–7.0)
Neutrophils: 52 %
Platelets: 233 10*3/uL (ref 150–450)
RBC: 3.59 x10E6/uL — ABNORMAL LOW (ref 4.14–5.80)
RDW: 12.2 % (ref 11.6–15.4)
WBC: 11.6 10*3/uL — ABNORMAL HIGH (ref 3.4–10.8)

## 2020-01-19 ENCOUNTER — Ambulatory Visit: Payer: Medicaid Other | Admitting: Internal Medicine

## 2020-01-19 ENCOUNTER — Other Ambulatory Visit: Payer: Self-pay | Admitting: Adult Health

## 2020-01-19 ENCOUNTER — Other Ambulatory Visit: Payer: Self-pay

## 2020-01-19 ENCOUNTER — Encounter: Payer: Self-pay | Admitting: Internal Medicine

## 2020-01-19 DIAGNOSIS — D538 Other specified nutritional anemias: Secondary | ICD-10-CM | POA: Diagnosis not present

## 2020-01-19 DIAGNOSIS — R17 Unspecified jaundice: Secondary | ICD-10-CM

## 2020-01-19 DIAGNOSIS — K8013 Calculus of gallbladder with acute and chronic cholecystitis with obstruction: Secondary | ICD-10-CM

## 2020-01-19 NOTE — Progress Notes (Signed)
[facility] [address] [place], [place] [address]  Internal MEDICINE  Office Visit Note  Patient Name: Manuel Horn  [id number]  [id number]  Date of Service: 01/19/2020  Chief Complaint  Patient presents with  . Follow-up    review labs and med refills, difficulty keeping food down, pain in stomach/abd and back, whites of eyes have turned yellow  . Schizophrenia  . policy update form    received    HPI  Pt is here in the office with his father. Pt has not been feeling well for few days, has abdominal pain and skin discoloration. Sclera and skin are both icteric. Denies being sexually active or any drug abuse, recent labs have been normal except CBC showed anemia and leukocytosis, labs were drawn about 4 weeks ago and these symptoms started less than a week ago. Pt has h/o gallstones and has declined surgery in the past    Current Medication: Outpatient Encounter Medications as of 01/19/2020  Medication Sig  . Paliperidone Palmitate (INVEGA SUSTENNA) 234 MG/1.5ML SUSP Inject 1 Syringe into the muscle every 30 (thirty) days.  . [DISCONTINUED] famotidine (PEPCID) 20 MG tablet Take 1 tablet (20 mg total) by mouth 2 (two) times daily. (Patient not taking: Reported on 12/22/2019)  . [DISCONTINUED] ondansetron (ZOFRAN ODT) 4 MG disintegrating tablet Take 1 tablet (4 mg total) by mouth every 8 (eight) hours as needed for nausea or vomiting. (Patient not taking: Reported on 12/22/2019)   No facility-administered encounter medications on file as of 01/19/2020.    Surgical History: Past Surgical History:  Procedure Laterality Date  . LEG SURGERY Left     Medical History: Past Medical History:  Diagnosis Date  . Schizoaffective disorder (HCC)     Family History: Family History  Problem Relation Age of Onset  . Diabetes Mother   . Hypertension Mother     Social History   Socioeconomic History  . Marital status: Single    Spouse name: Not on file  . Number of  children: Not on file  . Years of education: Not on file  . Highest education level: Not on file  Occupational History  . Not on file  Tobacco Use  . Smoking status: Current Every Day Smoker    Packs/day: 2.00    Types: Cigarettes  . Smokeless tobacco: Never Used  Vaping Use  . Vaping Use: Never used  Substance and Sexual Activity  . Alcohol use: No  . Drug use: No  . Sexual activity: Not on file  Other Topics Concern  . Not on file  Social History Narrative  . Not on file   Social Determinants of Health   Financial Resource Strain:   . Difficulty of Paying Living Expenses: Not on file  Food Insecurity:   . Worried About Programme researcher, broadcasting/film/video in the Last Year: Not on file  . Ran Out of Food in the Last Year: Not on file  Transportation Needs:   . Lack of Transportation (Medical): Not on file  . Lack of Transportation (Non-Medical): Not on file  Physical Activity:   . Days of Exercise per Week: Not on file  . Minutes of Exercise per Session: Not on file  Stress:   . Feeling of Stress : Not on file  Social Connections:   . Frequency of Communication with Friends and Family: Not on file  . Frequency of Social Gatherings with Friends and Family: Not on file  . Attends Religious Services: Not on file  .  Active Member of Clubs or Organizations: Not on file  . Attends Banker Meetings: Not on file  . Marital Status: Not on file  Intimate Partner Violence:   . Fear of Current or Ex-Partner: Not on file  . Emotionally Abused: Not on file  . Physically Abused: Not on file  . Sexually Abused: Not on file      Review of Systems  Constitutional: Positive for appetite change, fatigue and unexpected weight change.  HENT: Negative.   Eyes:       Sclera is icterus   Respiratory: Negative for choking and shortness of breath.   Cardiovascular: Negative for chest pain and leg swelling.  Gastrointestinal: Positive for abdominal pain, diarrhea and nausea.   Genitourinary: Negative.   Musculoskeletal: Negative.   Neurological: Negative.   Hematological: Negative.     Vital Signs: BP 92/72   Pulse 76   Temp (!) 97.2 F (36.2 C)   Resp 16   Ht 6' (1.829 m)   Wt 152 lb 3.2 oz (69 kg)   SpO2 99%   BMI 20.64 kg/m    Physical Exam Constitutional:      Appearance: He is ill-appearing.  Eyes:     General: Scleral icterus present.     Extraocular Movements: Extraocular movements intact.     Pupils: Pupils are equal, round, and reactive to light.  Cardiovascular:     Rate and Rhythm: Normal rate.     Heart sounds: Murmur heard.   Pulmonary:     Effort: Pulmonary effort is normal.  Abdominal:     Tenderness: There is abdominal tenderness. There is no guarding or rebound.  Neurological:     Mental Status: He is alert.      Assessment/Plan: 1. Calculus of gallbladder with acute on chronic cholecystitis with obstruction Has jaundice without any obvious reason , has h/o cholelithiasis, can be obstructive jaundice, will get stat labs and refer to surgery ASAP    2. Jaundice Unknown etiology at this time, await labs   3. Other specified nutritional anemias Recheck CBC and CMP  General Counseling: Amarian verbalizes understanding of the findings of todays visit and agrees with plan of treatment. I have discussed any further diagnostic evaluation that may be needed or ordered today. We also reviewed his medications today. he has been encouraged to call the office with any questions or concerns that should arise related to todays visit.  Repeat stat labs  CBC CMP HEPATITIS A AND B IGg AND IGM   Total time spent: 30 Minutes Time spent includes review of chart, medications, test results, and follow up plan with the patient.   Dr Lyndon Code Internal medicine

## 2020-01-20 ENCOUNTER — Emergency Department: Payer: Medicaid Other

## 2020-01-20 ENCOUNTER — Encounter: Payer: Self-pay | Admitting: Internal Medicine

## 2020-01-20 ENCOUNTER — Encounter
Admission: EM | Disposition: A | Discharge status: Home / Self Care | Payer: Self-pay | Source: Home / Self Care | Attending: Internal Medicine

## 2020-01-20 ENCOUNTER — Inpatient Hospital Stay
Admission: EM | Admit: 2020-01-20 | Discharge: 2020-01-25 | DRG: 418 | Disposition: A | Discharge status: Home / Self Care | Payer: Medicaid Other | Attending: Internal Medicine | Admitting: Internal Medicine

## 2020-01-20 ENCOUNTER — Other Ambulatory Visit: Payer: Self-pay

## 2020-01-20 ENCOUNTER — Inpatient Hospital Stay: Payer: Medicaid Other | Admitting: Certified Registered Nurse Anesthetist

## 2020-01-20 ENCOUNTER — Inpatient Hospital Stay: Payer: Medicaid Other

## 2020-01-20 DIAGNOSIS — R001 Bradycardia, unspecified: Secondary | ICD-10-CM | POA: Diagnosis not present

## 2020-01-20 DIAGNOSIS — D62 Acute posthemorrhagic anemia: Secondary | ICD-10-CM | POA: Diagnosis not present

## 2020-01-20 DIAGNOSIS — K8071 Calculus of gallbladder and bile duct without cholecystitis with obstruction: Secondary | ICD-10-CM | POA: Diagnosis present

## 2020-01-20 DIAGNOSIS — F259 Schizoaffective disorder, unspecified: Secondary | ICD-10-CM | POA: Diagnosis present

## 2020-01-20 DIAGNOSIS — Z20822 Contact with and (suspected) exposure to covid-19: Secondary | ICD-10-CM | POA: Diagnosis present

## 2020-01-20 DIAGNOSIS — K831 Obstruction of bile duct: Secondary | ICD-10-CM

## 2020-01-20 DIAGNOSIS — D638 Anemia in other chronic diseases classified elsewhere: Secondary | ICD-10-CM | POA: Diagnosis present

## 2020-01-20 DIAGNOSIS — E871 Hypo-osmolality and hyponatremia: Secondary | ICD-10-CM | POA: Diagnosis present

## 2020-01-20 DIAGNOSIS — F17200 Nicotine dependence, unspecified, uncomplicated: Secondary | ICD-10-CM | POA: Diagnosis present

## 2020-01-20 DIAGNOSIS — Z4682 Encounter for fitting and adjustment of non-vascular catheter: Secondary | ICD-10-CM

## 2020-01-20 DIAGNOSIS — R17 Unspecified jaundice: Secondary | ICD-10-CM

## 2020-01-20 DIAGNOSIS — Z716 Tobacco abuse counseling: Secondary | ICD-10-CM | POA: Diagnosis not present

## 2020-01-20 DIAGNOSIS — Z882 Allergy status to sulfonamides status: Secondary | ICD-10-CM | POA: Diagnosis not present

## 2020-01-20 DIAGNOSIS — K8051 Calculus of bile duct without cholangitis or cholecystitis with obstruction: Secondary | ICD-10-CM | POA: Diagnosis not present

## 2020-01-20 DIAGNOSIS — I959 Hypotension, unspecified: Secondary | ICD-10-CM | POA: Diagnosis not present

## 2020-01-20 DIAGNOSIS — F17210 Nicotine dependence, cigarettes, uncomplicated: Secondary | ICD-10-CM | POA: Diagnosis present

## 2020-01-20 HISTORY — PX: ENDOSCOPIC RETROGRADE CHOLANGIOPANCREATOGRAPHY (ERCP) WITH PROPOFOL: SHX5810

## 2020-01-20 LAB — COMPREHENSIVE METABOLIC PANEL
ALT: 274 IU/L — ABNORMAL HIGH (ref 0–44)
ALT: 262 U/L — ABNORMAL HIGH (ref 0–44)
AST: 166 IU/L — ABNORMAL HIGH (ref 0–40)
AST: 175 U/L — ABNORMAL HIGH (ref 15–41)
Albumin/Globulin Ratio: 1.5 (ref 1.2–2.2)
Albumin: 4 g/dL (ref 4.0–5.0)
Albumin: 3.4 g/dL — ABNORMAL LOW (ref 3.5–5.0)
Alkaline Phosphatase: 478 IU/L — ABNORMAL HIGH (ref 44–121)
Alkaline Phosphatase: 379 U/L — ABNORMAL HIGH (ref 38–126)
Anion gap: 9 (ref 5–15)
BUN/Creatinine Ratio: 6 — ABNORMAL LOW (ref 9–20)
BUN: 7 mg/dL (ref 6–20)
BUN: 8 mg/dL (ref 6–20)
Bilirubin Total: 21.6 mg/dL (ref 0.0–1.2)
CO2: 27 mmol/L (ref 20–29)
CO2: 26 mmol/L (ref 22–32)
Calcium: 9.1 mg/dL (ref 8.7–10.2)
Calcium: 9.5 mg/dL (ref 8.9–10.3)
Chloride: 98 mmol/L (ref 96–106)
Chloride: 96 mmol/L — ABNORMAL LOW (ref 98–111)
Creatinine, Ser: 1.14 mg/dL (ref 0.76–1.27)
Creatinine, Ser: UNDETERMINED mg/dL (ref 0.61–1.24)
GFR calc Af Amer: 93 mL/min/{1.73_m2} (ref >59)
GFR calc non Af Amer: 81 mL/min/{1.73_m2} (ref >59)
Globulin, Total: 2.6 g/dL (ref 1.5–4.5)
Glucose, Bld: 195 mg/dL — ABNORMAL HIGH (ref 70–99)
Glucose: 66 mg/dL (ref 65–99)
Potassium: 4.2 mmol/L (ref 3.5–5.2)
Potassium: 4.5 mmol/L (ref 3.5–5.1)
Sodium: 134 mmol/L (ref 134–144)
Sodium: 131 mmol/L — ABNORMAL LOW (ref 135–145)
Total Bilirubin: 24 mg/dL (ref 0.3–1.2)
Total Protein: 6.6 g/dL (ref 6.0–8.5)
Total Protein: 7.6 g/dL (ref 6.5–8.1)

## 2020-01-20 LAB — CBC WITH DIFFERENTIAL/PLATELET
Basophils Absolute: 0.1 10*3/uL (ref 0.0–0.2)
Basos: 1 %
EOS (ABSOLUTE): 0.2 10*3/uL (ref 0.0–0.4)
Eos: 2 %
Hematocrit: 33.4 % — ABNORMAL LOW (ref 37.5–51.0)
Hemoglobin: 12.1 g/dL — ABNORMAL LOW (ref 13.0–17.7)
Immature Grans (Abs): 0 10*3/uL (ref 0.0–0.1)
Immature Granulocytes: 0 %
Lymphocytes Absolute: 3.6 10*3/uL — ABNORMAL HIGH (ref 0.7–3.1)
Lymphs: 34 %
MCH: 33.4 pg — ABNORMAL HIGH (ref 26.6–33.0)
MCHC: 36.2 g/dL — ABNORMAL HIGH (ref 31.5–35.7)
MCV: 92 fL (ref 79–97)
Monocytes Absolute: 0.7 10*3/uL (ref 0.1–0.9)
Monocytes: 6 %
Neutrophils Absolute: 6.1 10*3/uL (ref 1.4–7.0)
Neutrophils: 57 %
Platelets: 168 10*3/uL (ref 150–450)
RBC: 3.62 x10E6/uL — ABNORMAL LOW (ref 4.14–5.80)
RDW: 15.1 % (ref 11.6–15.4)
WBC: 10.7 10*3/uL (ref 3.4–10.8)

## 2020-01-20 LAB — CBC
Hemoglobin: 12.3 g/dL — ABNORMAL LOW (ref 13.0–17.0)
Platelets: 168 10*3/uL (ref 150–400)
WBC: 9.9 10*3/uL (ref 4.0–10.5)

## 2020-01-20 LAB — URINALYSIS, COMPLETE (UACMP) WITH MICROSCOPIC
Bilirubin Urine: NEGATIVE
Glucose, UA: NEGATIVE mg/dL
Hgb urine dipstick: NEGATIVE
Ketones, ur: NEGATIVE mg/dL
Nitrite: NEGATIVE
Protein, ur: NEGATIVE mg/dL
Specific Gravity, Urine: 1.018 (ref 1.005–1.030)
WBC, UA: >50 WBC/hpf — ABNORMAL HIGH (ref 0–5)
pH: 5 (ref 5.0–8.0)

## 2020-01-20 LAB — LIPASE, BLOOD: Lipase: 34 U/L (ref 11–51)

## 2020-01-20 LAB — RESPIRATORY PANEL BY RT PCR (FLU A&B, COVID)
Influenza A by PCR: NEGATIVE
Influenza B by PCR: NEGATIVE
SARS Coronavirus 2 by RT PCR: NEGATIVE

## 2020-01-20 LAB — HEPATITIS B SURFACE ANTIBODY,QUALITATIVE: Hep B Surface Ab, Qual: NONREACTIVE

## 2020-01-20 LAB — SEDIMENTATION RATE: Sed Rate: 58 mm/hr — ABNORMAL HIGH (ref 0–15)

## 2020-01-20 LAB — HEPATITIS B SURFACE ANTIGEN: Hepatitis B Surface Ag: NEGATIVE

## 2020-01-20 LAB — B12 AND FOLATE PANEL
Folate: 5.7 ng/mL (ref >3.0)
Vitamin B-12: 607 pg/mL (ref 232–1245)

## 2020-01-20 LAB — HEPATITIS A ANTIBODY, IGM: Hep A IgM: NEGATIVE

## 2020-01-20 LAB — PROTIME-INR
INR: 1 (ref 0.8–1.2)
Prothrombin Time: 12.9 seconds (ref 11.4–15.2)

## 2020-01-20 LAB — FERRITIN: Ferritin: 1027 ng/mL — ABNORMAL HIGH (ref 30–400)

## 2020-01-20 SURGERY — ENDOSCOPIC RETROGRADE CHOLANGIOPANCREATOGRAPHY (ERCP) WITH PROPOFOL
Anesthesia: General

## 2020-01-20 MED ORDER — LIDOCAINE HCL (PF) 2 % IJ SOLN
INTRAMUSCULAR | Status: AC
  Filled 2020-01-20: qty 10

## 2020-01-20 MED ORDER — ONDANSETRON HCL 4 MG/2ML IJ SOLN
INTRAMUSCULAR | Status: AC
  Filled 2020-01-20: qty 2

## 2020-01-20 MED ORDER — INDOMETHACIN 50 MG RE SUPP
RECTAL | Status: DC | PRN
  Administered 2020-01-20: 100 mg via RECTAL

## 2020-01-20 MED ORDER — PROPOFOL 500 MG/50ML IV EMUL
INTRAVENOUS | Status: AC
  Filled 2020-01-20: qty 50

## 2020-01-20 MED ORDER — LACTATED RINGERS IV SOLN: Freq: Once | INTRAVENOUS | Status: AC

## 2020-01-20 MED ORDER — FENTANYL CITRATE (PF) 100 MCG/2ML IJ SOLN
INTRAMUSCULAR | Status: DC | PRN
  Administered 2020-01-20 (×2): 25 ug via INTRAVENOUS
  Administered 2020-01-20: 50 ug via INTRAVENOUS

## 2020-01-20 MED ORDER — LIDOCAINE HCL (CARDIAC) PF 100 MG/5ML IV SOSY
PREFILLED_SYRINGE | INTRAVENOUS | Status: DC | PRN
  Administered 2020-01-20: 50 mg via INTRAVENOUS

## 2020-01-20 MED ORDER — INDOMETHACIN 50 MG RE SUPP
100.0000 mg | Freq: Once | RECTAL | Status: DC
  Filled 2020-01-20: qty 2

## 2020-01-20 MED ORDER — PROPOFOL 500 MG/50ML IV EMUL
INTRAVENOUS | Status: DC | PRN
  Administered 2020-01-20: 140 ug/kg/min via INTRAVENOUS

## 2020-01-20 MED ORDER — GLUCAGON HCL RDNA (DIAGNOSTIC) 1 MG IJ SOLR
INTRAMUSCULAR | Status: AC
  Filled 2020-01-20: qty 1

## 2020-01-20 MED ORDER — SODIUM CHLORIDE 0.9 % IV BOLUS
1000.0000 mL | Freq: Once | INTRAVENOUS | Status: AC
  Administered 2020-01-20: 1000 mL via INTRAVENOUS

## 2020-01-20 MED ORDER — FENTANYL CITRATE (PF) 100 MCG/2ML IJ SOLN
INTRAMUSCULAR | Status: AC
  Filled 2020-01-20: qty 2

## 2020-01-20 MED ORDER — MIDAZOLAM HCL 2 MG/2ML IJ SOLN
INTRAMUSCULAR | Status: AC
  Filled 2020-01-20: qty 2

## 2020-01-20 MED ORDER — PANTOPRAZOLE SODIUM 40 MG IV SOLR
40.0000 mg | INTRAVENOUS | Status: DC
  Administered 2020-01-20 – 2020-01-24 (×5): 40 mg via INTRAVENOUS
  Filled 2020-01-20 (×5): qty 40

## 2020-01-20 MED ORDER — MORPHINE SULFATE (PF) 2 MG/ML IV SOLN: 2.0000 mg | INTRAVENOUS | Status: DC | PRN

## 2020-01-20 MED ORDER — PROMETHAZINE HCL 25 MG/ML IJ SOLN: 12.5000 mg | Freq: Four times a day (QID) | INTRAMUSCULAR | Status: DC | PRN

## 2020-01-20 MED ORDER — PHENYLEPHRINE HCL (PRESSORS) 10 MG/ML IV SOLN
INTRAVENOUS | Status: DC | PRN
  Administered 2020-01-20: 100 ug via INTRAVENOUS
  Administered 2020-01-20: 200 ug via INTRAVENOUS

## 2020-01-20 MED ORDER — LIDOCAINE HCL (PF) 2 % IJ SOLN
INTRAMUSCULAR | Status: AC
  Filled 2020-01-20: qty 5

## 2020-01-20 MED ORDER — PROPOFOL 10 MG/ML IV BOLUS
INTRAVENOUS | Status: DC | PRN
  Administered 2020-01-20: 70 mg via INTRAVENOUS

## 2020-01-20 MED ORDER — GLUCAGON HCL (RDNA) 1 MG IJ SOLR
INTRAMUSCULAR | Status: DC | PRN
  Administered 2020-01-20: .5 mg via INTRAVENOUS

## 2020-01-20 MED ORDER — GLUCAGON HCL RDNA (DIAGNOSTIC) 1 MG IJ SOLR
INTRAMUSCULAR | Status: DC | PRN
  Administered 2020-01-20 (×2): .5 mg via INTRAVENOUS

## 2020-01-20 MED ORDER — ONDANSETRON HCL 4 MG PO TABS: 4.0000 mg | ORAL_TABLET | Freq: Four times a day (QID) | ORAL | Status: DC | PRN

## 2020-01-20 MED ORDER — INDOMETHACIN 50 MG RE SUPP
RECTAL | Status: AC
  Filled 2020-01-20: qty 2

## 2020-01-20 MED ORDER — ONDANSETRON HCL 4 MG/2ML IJ SOLN: 4.0000 mg | Freq: Four times a day (QID) | INTRAMUSCULAR | Status: DC | PRN

## 2020-01-20 MED ORDER — EPHEDRINE SULFATE 50 MG/ML IJ SOLN
INTRAMUSCULAR | Status: DC | PRN
  Administered 2020-01-20 (×2): 5 mg via INTRAVENOUS

## 2020-01-20 MED ORDER — MIDAZOLAM HCL 2 MG/2ML IJ SOLN
INTRAMUSCULAR | Status: DC | PRN
  Administered 2020-01-20: 2 mg via INTRAVENOUS

## 2020-01-20 MED ORDER — DEXAMETHASONE SODIUM PHOSPHATE 10 MG/ML IJ SOLN
INTRAMUSCULAR | Status: AC
  Filled 2020-01-20: qty 1

## 2020-01-20 MED ORDER — DEXTROSE-NACL 5-0.9 % IV SOLN: INTRAVENOUS | Status: DC

## 2020-01-20 NOTE — Consult Note (Addendum)
  Salathiel Ferrara, MD FACG  [address] [place], [place] [address] Phone: [phone] Fax : [phone]  Consultation  Referring Provider:     Dr. Agbata Primary Care Physician:  Khan, Fozia M, MD Primary Gastroenterologist: Unassigned        Reason for Consultation:     Choledocholithiasis  Date of Admission:  01/20/2020 Date of Consultation:  01/20/2020         HPI:   Manuel Horn is a 39 y.o. male was seen by his primary care provider yesterday and diagnosed with gallstones and acute on chronic cholecystitis with obstruction with a surgical referral requested.  The patient had presented to his primary care provider with both scleral icterus and jaundice.  The patient has had been diagnosed with gallstones in the past but had declined surgery. The patient then came to the ER today with intractable nausea and vomiting with worsening jaundice.  The patient reports that his nausea and vomiting were last Saturday and Sunday.  The patient had an ultrasound of the right upper quadrant today that showed:  IMPRESSION: New choledocholithiasis with intrahepatic biliary dilatation. Stones within the common bile duct measure up to 11.5 mm.  The patient's HIDA scan from October 6 of last year showed nonvisualization of the gallbladder despite morphine augmentation.  Her liver enzymes showed:  Component     Latest Ref Rng & Units 11/26/2019 01/20/2020  AST     15 - 41 U/L 18 175 (H)  ALT     0 - 44 U/L 13 262 (H)  Alkaline Phosphatase     38 - 126 U/L 90 379 (H)  Total Bilirubin     0.3 - 1.2 mg/dL 1.0 24.0 (HH)    Past Medical History:  Diagnosis Date  . Schizoaffective disorder (HCC)     Past Surgical History:  Procedure Laterality Date  . LEG SURGERY Left     Prior to Admission medications   Not on File    Family History  Problem Relation Age of Onset  . Diabetes Mother   . Hypertension Mother      Social History   Tobacco Use  . Smoking status: Current  Every Day Smoker    Packs/day: 2.00    Types: Cigarettes  . Smokeless tobacco: Never Used  Vaping Use  . Vaping Use: Never used  Substance Use Topics  . Alcohol use: No  . Drug use: No    Allergies as of 01/20/2020 - Review Complete 01/20/2020  Allergen Reaction Noted  . Sulfa antibiotics Rash 11/12/2013    Review of Systems:    All systems reviewed and negative except where noted in HPI.   Physical Exam:  Vital signs in last 24 hours: Temp:  [97.1 F (36.2 C)-97.6 F (36.4 C)] 97.1 F (36.2 C) (11/02 1538) Pulse Rate:  [45-88] 57 (11/02 1538) Resp:  [16] 16 (11/02 1538) BP: (101-112)/(64-78) 105/71 (11/02 1538) SpO2:  [94 %-100 %] 100 % (11/02 1538) Weight:  [68.9 kg] 68.9 kg (11/02 1538)   General:   Pleasant, cooperative in NAD Head:  Normocephalic and atraumatic. Eyes:   Positive icterus.   Conjunctiva yellow. PERRLA. Ears:  Normal auditory acuity. Neck:  Supple; no masses or thyroidomegaly Lungs: Respirations even and unlabored. Lungs clear to auscultation bilaterally.   No wheezes, crackles, or rhonchi.  Heart:  Regular rate and rhythm;  Without murmur, clicks, rubs or gallops Abdomen:  Soft, nondistended, nontender. Normal bowel sounds. No appreciable masses or hepatomegaly.    No rebound or guarding.  Rectal:  Not performed. Msk:  Symmetrical without gross deformities.    Extremities:  Without edema, cyanosis or clubbing. Neurologic:  Alert and oriented x3;  grossly normal neurologically. Skin:  Intact without significant lesions or rashes.  Positive jaundice Cervical Nodes:  No significant cervical adenopathy. Psych:  Alert and cooperative. Normal affect.  LAB RESULTS: Recent Labs    01/20/20 1011  WBC 9.9  HGB 12.3*  HCT RESULTS UNAVAILABLE DUE TO INTERFERING SUBSTANCE  PLT 168   BMET Recent Labs    01/20/20 1011  NA 131*  K 4.5  CL 96*  CO2 26  GLUCOSE 195*  BUN 8  CREATININE UNABLE TO REPORT DUE TO ICTERUS  CALCIUM 9.5   LFT Recent Labs      01/20/20 1011  PROT 7.6  ALBUMIN 3.4*  AST 175*  ALT 262*  ALKPHOS 379*  BILITOT 24.0*   PT/INR No results for input(s): LABPROT, INR in the last 72 hours.  STUDIES: US ABDOMEN LIMITED RUQ (LIVER/GB)  Result Date: 01/20/2020 CLINICAL DATA:  Jaundice EXAM: ULTRASOUND ABDOMEN LIMITED RIGHT UPPER QUADRANT COMPARISON:  11/28/2018 right upper quadrant ultrasound and CT renal stone. FINDINGS: Gallbladder: Multiple mobile intraluminal gallstones measuring up to 8.7 mm with biliary sludge. No gallbladder wall thickening. No pericholecystic free fluid. Sonographic Murphy sign was negative. Common bile duct: Diameter: Newly dilated measuring 15.9 mm with intraluminal calculi measuring up to 11.5 mm. Liver: No focal lesion identified. Within normal limits in parenchymal echogenicity. The intrahepatic bile ducts are dilated. Portal vein is patent on color Doppler imaging with normal direction of blood flow towards the liver. Other: None. IMPRESSION: New choledocholithiasis with intrahepatic biliary dilatation. Stones within the common bile duct measure up to 11.5 mm. Sequela of cholelithiasis without cholecystitis. Electronically Signed   By: Stana Bunting M.D.   On: 01/20/2020 12:02      Impression / Plan:   Assessment: Active Problems:   Choledocholithiasis with obstruction   Nicotine dependence   Schizoaffective disorder (HCC)   Manuel Horn is a 39 y.o. y/o male with choledocholithiasis and elevated liver enzymes with a bilirubin of 24.  The patient has been to have gallstones in the past.  Plan:  The patient will be set up for an ERCP.  The patient has been explained the risks and benefit including pancreatitis, bleeding, perforation and death.  The patient states he understands the plan and will have his ERCP done today.  A surgical consultation should be obtained for a laparoscopic cholecystectomy.  Thank you for involving me in the care of this patient.      LOS: 0 days    Midge Minium, MD, [facility] 01/20/2020, 3:44 PM,  Pager [phone] 7am-5pm  Check AMION for 5pm -7am coverage and on weekends   Note: This dictation was prepared with Dragon dictation along with smaller phrase technology. Any transcriptional errors that result from this process are unintentional.

## 2020-01-20 NOTE — Progress Notes (Signed)
Pt off the floor for ERCP, belongings sent with pt.

## 2020-01-20 NOTE — Op Note (Signed)
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 Procedure:             Pre-Anesthesia Assessment:                        - Prior to the procedure, a History and Physical was                         performed, and patient medications and allergies were                         reviewed. The patient's tolerance of previous                         anesthesia was also reviewed. The risks and benefits                         of the procedure and the sedation options and risks                         were discussed with the patient. All questions were                         answered, and informed consent was obtained. Prior                         Anticoagulants: The patient has taken no previous                         anticoagulant or antiplatelet agents. ASA Grade                         Assessment: II - A patient with mild systemic disease.                         After reviewing the risks and benefits, the patient                         was deemed in satisfactory condition to undergo the                         procedure.                        After obtaining informed consent, the scope was passed  under direct vision. Throughout the procedure, the                         patient's blood pressure, pulse, and oxygen                         saturations were monitored continuously. The was                         introduced through the mouth,  and used to inject                         contrast into and used for direct visualization of the                         ventral pancreatic duct. 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 Findings:      The scout film was normal. The esophagus was successfully intubated       under direct vision. The scope was advanced to a normal major papilla in       the descending duodenum without detailed examination of the pharynx,       larynx and associated structures, and upper GI tract. The upper GI tract       was grossly normal. The ventral pancreatic duct alone was cannulated and       opacified with the short-nosed traction sphincterotome. The following       techniques were unsuccessful: short-nosed traction sphincterotome. I       personally interpreted the pancreatic duct images. There was brisk flow       of contrast through the ducts. Image quality was excellent. Contrast       extended to the pancreatic duct. The bile duct could not be cannulated       with the short-nosed traction sphincterotome. A kneedle knife was used       to attempt a palliotomy without success. Procedure was aborted. Impression:            - The ERCP was aborted due to the difficulty of the                         procedure. Recommendation:        - Will set the patient up for a transhepatic approch                         with stenting. Procedure Code(s):     --- Professional ---                        [phone], Endoscopic retrograde cholangiopancreatography                         (ERCP); diagnostic, including collection of                         specimen(s) by brushing  or washing, when performed                         (separate procedure)                        [id number], Endoscopic catheterization of the pancreatic                         ductal system, radiological supervision and                          interpretation Diagnosis Code(s):     --- Professional ---                        R74.8, Abnormal levels of other serum enzymes                        K80.50, Calculus of bile duct without cholangitis or                         cholecystitis without obstruction                        R17, Unspecified jaundice CPT copyright 2019 [employer]. All rights reserved. The codes documented in this report are preliminary and upon coder review may  be revised to meet current compliance requirements. Midge Minium MD, MD 01/20/2020 5:45:20 PM This report has been signed electronically. Number of Addenda: 0 Note Initiated On: 01/20/2020 2:17 PM Estimated Blood Loss:  Estimated blood loss: none.      [facility]

## 2020-01-20 NOTE — ED Provider Notes (Signed)
[facility] Emergency Department Provider Note    First MD Initiated Contact with Patient 01/20/20 1057     (approximate)  I have reviewed the triage vital signs and the nursing notes.   HISTORY  Chief Complaint Abdominal Pain    HPI Manuel Horn is a 39 y.o. male the below listed past medical history recently documented cholelithiasis presents to the ER for intractable nausea vomiting worsening jaundice.  Denies any measured fevers or chills.  Not having particular pain but I am to keep any food down.  Denies any new medications.  Noted the yellowing of his eyes roughly for 5 days ago.    Past Medical History:  Diagnosis Date  . Schizoaffective disorder (HCC)    Family History  Problem Relation Age of Onset  . Diabetes Mother   . Hypertension Mother    Past Surgical History:  Procedure Laterality Date  . LEG SURGERY Left    Patient Active Problem List   Diagnosis Date Noted  . Sebaceous cyst 11/14/2013      Prior to Admission medications   Not on File    Allergies Sulfa antibiotics    Social History Social History   Tobacco Use  . Smoking status: Current Every Day Smoker    Packs/day: 2.00    Types: Cigarettes  . Smokeless tobacco: Never Used  Vaping Use  . Vaping Use: Never used  Substance Use Topics  . Alcohol use: No  . Drug use: No    Review of Systems Patient denies headaches, rhinorrhea, blurry vision, numbness, shortness of breath, chest pain, edema, cough, abdominal pain, nausea, vomiting, diarrhea, dysuria, fevers, rashes or hallucinations unless otherwise stated above in HPI. ____________________________________________   PHYSICAL EXAM:  VITAL SIGNS: Vitals:   01/20/20 1105 01/20/20 1107  BP: 111/73   Pulse:  62  Resp:    Temp:    SpO2:  100%    Constitutional: Alert and oriented.  Eyes: Conjunctivae are icteric Head: Atraumatic. Nose: No congestion/rhinnorhea. Mouth/Throat: Mucous membranes are  moist.   Neck: No stridor. Painless ROM.  Cardiovascular: Normal rate, regular rhythm. Grossly normal heart sounds.  Good peripheral circulation. Respiratory: Normal respiratory effort.  No retractions. Lungs CTAB. Gastrointestinal: Soft and nontender. No distention. No abdominal bruits. No CVA tenderness. Genitourinary:  Musculoskeletal: No lower extremity tenderness nor edema.  No joint effusions. Neurologic:  Normal speech and language. No gross focal neurologic deficits are appreciated. No facial droop Skin:  Skin is warm, dry and intact. No rash noted. Psychiatric: Mood and affect are normal. Speech and behavior are normal.  ____________________________________________   LABS (all labs ordered are listed, but only abnormal results are displayed)  Results for orders placed or performed during the hospital encounter of 01/20/20 (from the past 24 hour(s))  Lipase, blood     Status: None   Collection Time: 01/20/20 10:11 AM  Result Value Ref Range   Lipase 34 11 - 51 U/L  Comprehensive metabolic panel     Status: Abnormal   Collection Time: 01/20/20 10:11 AM  Result Value Ref Range   Sodium 131 (L) 135 - 145 mmol/L   Potassium 4.5 3.5 - 5.1 mmol/L   Chloride 96 (L) 98 - 111 mmol/L   CO2 26 22 - 32 mmol/L   Glucose, Bld 195 (H) 70 - 99 mg/dL   BUN 8 6 - 20 mg/dL   Creatinine, Ser UNABLE TO REPORT DUE TO ICTERUS 0.61 - 1.24 mg/dL   Calcium 9.5 8.9 -  10.3 mg/dL   Total Protein 7.6 6.5 - 8.1 g/dL   Albumin 3.4 (L) 3.5 - 5.0 g/dL   AST 160 (H) 15 - 41 U/L   ALT 262 (H) 0 - 44 U/L   Alkaline Phosphatase 379 (H) 38 - 126 U/L   Total Bilirubin 24.0 (HH) 0.3 - 1.2 mg/dL   GFR, Estimated NOT CALCULATED >60 mL/min   Anion gap 9 5 - 15  CBC     Status: Abnormal   Collection Time: 01/20/20 10:11 AM  Result Value Ref Range   WBC 9.9 4.0 - 10.5 K/uL   RBC RESULTS UNAVAILABLE DUE TO INTERFERING SUBSTANCE 4.22 - 5.81 MIL/uL   Hemoglobin 12.3 (L) 13.0 - 17.0 g/dL   HCT RESULTS  UNAVAILABLE DUE TO INTERFERING SUBSTANCE 39 - 52 %   MCV RESULTS UNAVAILABLE DUE TO INTERFERING SUBSTANCE 80.0 - 100.0 fL   MCH RESULTS UNAVAILABLE DUE TO INTERFERING SUBSTANCE 26.0 - 34.0 pg   MCHC RESULTS UNAVAILABLE DUE TO INTERFERING SUBSTANCE 30.0 - 36.0 g/dL   RDW RESULTS UNAVAILABLE DUE TO INTERFERING SUBSTANCE 11.5 - 15.5 %   Platelets 168 150 - 400 K/uL   nRBC RESULTS UNAVAILABLE DUE TO INTERFERING SUBSTANCE 0.0 - 0.2 %  Urinalysis, Complete w Microscopic     Status: Abnormal   Collection Time: 01/20/20 10:11 AM  Result Value Ref Range   Color, Urine AMBER (A) YELLOW   APPearance HAZY (A) CLEAR   Specific Gravity, Urine 1.018 1.005 - 1.030   pH 5.0 5.0 - 8.0   Glucose, UA NEGATIVE NEGATIVE mg/dL   Hgb urine dipstick NEGATIVE NEGATIVE   Bilirubin Urine NEGATIVE NEGATIVE   Ketones, ur NEGATIVE NEGATIVE mg/dL   Protein, ur NEGATIVE NEGATIVE mg/dL   Nitrite NEGATIVE NEGATIVE   Leukocytes,Ua MODERATE (A) NEGATIVE   RBC / HPF 0-5 0 - 5 RBC/hpf   WBC, UA >50 (H) 0 - 5 WBC/hpf   Bacteria, UA RARE (A) NONE SEEN   Squamous Epithelial / LPF 0-5 0 - 5   Mucus PRESENT    ____________________________________________ ____________________________________________  RADIOLOGY  I personally reviewed all radiographic images ordered to evaluate for the above acute complaints and reviewed radiology reports and findings.  These findings were personally discussed with the patient.  Please see medical record for radiology report.  ____________________________________________   PROCEDURES  Procedure(s) performed:  Procedures    Critical Care performed: no ____________________________________________   INITIAL IMPRESSION / ASSESSMENT AND PLAN / ED COURSE  Pertinent labs & imaging results that were available during my care of the patient were reviewed by me and considered in my medical decision making (see chart for details).   DDX: Cholelithiasis, cholelithiasis, mass,  hemolysis  Manuel Horn is a 39 y.o. who presents to the ED with presentation as described above.  Patient with evidence of acute obstructive jaundice secondary to choledocholithiasis.  No evidence of cholangitis or Coley cystitis no Tegtmeyer count.  Significantly elevated T bili mildly elevated LFTs.  Have consulted with GI will discussed with hospitalist for admission and further medical work-up.     The patient was evaluated in Emergency Department today for the symptoms described in the history of present illness. He/she was evaluated in the context of the global COVID-19 pandemic, which necessitated consideration that the patient might be at risk for infection with the SARS-CoV-2 virus that causes COVID-19. Institutional protocols and algorithms that pertain to the evaluation of patients at risk for COVID-19 are in a state of rapid change based  on information released by regulatory bodies including the CDC and federal and state organizations. These policies and algorithms were followed during the patient's care in the ED.  As part of my medical decision making, I reviewed the following data within the electronic MEDICAL RECORD NUMBER Nursing notes reviewed and incorporated, Labs reviewed, notes from prior ED visits and [place] Controlled Substance Database   ____________________________________________   FINAL CLINICAL IMPRESSION(S) / ED DIAGNOSES  Final diagnoses:  Jaundice  Calculus of bile duct without cholecystitis with obstruction      NEW MEDICATIONS STARTED DURING THIS VISIT:  New Prescriptions   No medications on file     Note:  This document was prepared using Dragon voice recognition software and may include unintentional dictation errors.    Willy Eddy, MD 01/20/20 [phone]

## 2020-01-20 NOTE — Anesthesia Preprocedure Evaluation (Signed)
Anesthesia Evaluation  Patient identified by MRN, date of birth, ID band Patient awake    Reviewed: Allergy & Precautions, NPO status , Patient's Chart, lab work & pertinent test results  History of Anesthesia Complications Negative for: history of anesthetic complications  Airway Mallampati: II       Dental   Pulmonary neg sleep apnea, neg COPD, Current Smoker,           Cardiovascular (-) hypertension(-) Past MI and (-) CHF (-) dysrhythmias (-) Valvular Problems/Murmurs     Neuro/Psych neg Seizures Schizophrenia    GI/Hepatic Neg liver ROS, neg GERD  ,  Endo/Other  neg diabetes  Renal/GU negative Renal ROS     Musculoskeletal   Abdominal   Peds  Hematology   Anesthesia Other Findings   Reproductive/Obstetrics                             Anesthesia Physical Anesthesia Plan  ASA: II  Anesthesia Plan: General   Post-op Pain Management:    Induction: Intravenous  PONV Risk Score and Plan: 1 and Propofol infusion and TIVA  Airway Management Planned: Nasal Cannula  Additional Equipment:   Intra-op Plan:   Post-operative Plan:   Informed Consent: I have reviewed the patients History and Physical, chart, labs and discussed the procedure including the risks, benefits and alternatives for the proposed anesthesia with the patient or authorized representative who has indicated his/her understanding and acceptance.       Plan Discussed with:   Anesthesia Plan Comments:         Anesthesia Quick Evaluation

## 2020-01-20 NOTE — H&P (View-Only) (Signed)
Manuel Minium, MD [facility]  [address] [place], [place] [address] Phone: [phone] Fax : [phone]  Consultation  Referring Provider:     Dr. Joylene Horn Primary Care Physician:  Manuel Code, MD Primary Gastroenterologist: Manuel Horn        Reason for Consultation:     Choledocholithiasis  Date of Admission:  01/20/2020 Date of Consultation:  01/20/2020         HPI:   Manuel Horn is a 39 y.o. male was seen by his primary care provider yesterday and diagnosed with gallstones and acute on chronic cholecystitis with obstruction with a surgical referral requested.  The patient had presented to his primary care provider with both scleral icterus and jaundice.  The patient has had been diagnosed with gallstones in the past but had declined surgery. The patient then came to the ER today with intractable nausea and vomiting with worsening jaundice.  The patient reports that his nausea and vomiting were last Saturday and "Sunday.  The patient had an ultrasound of the right upper quadrant today that showed:  IMPRESSION: New choledocholithiasis with intrahepatic biliary dilatation. Stones within the common bile duct measure up to 11.5 mm.  The patient's HIDA scan from October 6 of last year showed nonvisualization of the gallbladder despite morphine augmentation.  Her liver enzymes showed:  Component     Latest Ref Rng & Units 11/26/2019 01/20/2020  AST     15 - 41 U/L 18 175 (H)  ALT     0 - 44 U/L 13 262 (H)  Alkaline Phosphatase     38"  - 126 U/L 90 379 (H)  Total Bilirubin     0.3 - 1.2 mg/dL 1.0 (HH)    Past Medical History:  Diagnosis Date  . Schizoaffective disorder [facility])     Past Surgical History:  Procedure Laterality Date  . LEG SURGERY Left     Prior to Admission medications   Not on File    Family History  Problem Relation Age of Onset  . Diabetes Mother   . Hypertension Mother      Social History   Tobacco Use  . Smoking status: Current  Every Day Smoker    Packs/day: 2.00    Types: Cigarettes  . Smokeless tobacco: Never Used  Vaping Use  . Vaping Use: Never used  Substance Use Topics  . Alcohol use: No  . Drug use: No    Allergies as of 01/20/2020 - Review Complete 01/20/2020  Allergen Reaction Noted  . Sulfa antibiotics Rash 11/12/2013    Review of Systems:    All systems reviewed and negative except where noted in HPI.   Physical Exam:  Vital signs in last 24 hours: Temp:  [97.1 F (36.2 C)-97.6 F (36.4 C)] 97.1 F (36.2 C) (11/02 1538) Pulse Rate:  [45-88] 57 (11/02 1538) Resp:  [16] 16 (11/02 1538) BP: (101-112)/(64-78) 105/71 (11/02 1538) SpO2:  [94 %-100 %] 100 % (11/02 1538) Weight:  [68.9 kg] 68.9 kg (11/02 1538)   General:   Pleasant, cooperative in NAD Head:  Normocephalic and atraumatic. Eyes:   Positive icterus.   Conjunctiva yellow. PERRLA. Ears:  Normal auditory acuity. Neck:  Supple; no masses or thyroidomegaly Lungs: Respirations even and unlabored. Lungs clear to auscultation bilaterally.   No wheezes, crackles, or rhonchi.  Heart:  Regular rate and rhythm;  Without murmur, clicks, rubs or gallops Abdomen:  Soft, nondistended, nontender. Normal bowel sounds. No appreciable masses or hepatomegaly.  No rebound or guarding.  Rectal:  Not performed. Msk:  Symmetrical without gross deformities.    Extremities:  Without edema, cyanosis or clubbing. Neurologic:  Alert and oriented x3;  grossly normal neurologically. Skin:  Intact without significant lesions or rashes.  Positive jaundice Cervical Nodes:  No significant cervical adenopathy. Psych:  Alert and cooperative. Normal affect.  LAB RESULTS: Recent Labs    01/20/20 1011  WBC 9.9  HGB 12.3*  HCT RESULTS UNAVAILABLE DUE TO INTERFERING SUBSTANCE  PLT 168   BMET Recent Labs    01/20/20 1011  NA 131*  K 4.5  CL 96*  CO2 26  GLUCOSE 195*  BUN 8  CREATININE UNABLE TO REPORT DUE TO ICTERUS  CALCIUM 9.5   LFT Recent Labs      01/20/20 1011  PROT 7.6  ALBUMIN 3.4*  AST 175*  ALT 262*  ALKPHOS 379*  BILITOT 24.0*   PT/INR No results for input(s): LABPROT, INR in the last 72 hours.  STUDIES: US ABDOMEN LIMITED RUQ (LIVER/GB)  Result Date: 01/20/2020 CLINICAL DATA:  Jaundice EXAM: ULTRASOUND ABDOMEN LIMITED RIGHT UPPER QUADRANT COMPARISON:  11/28/2018 right upper quadrant ultrasound and CT renal stone. FINDINGS: Gallbladder: Multiple mobile intraluminal gallstones measuring up to 8.7 mm with biliary sludge. No gallbladder wall thickening. No pericholecystic free fluid. Sonographic Murphy sign was negative. Common bile duct: Diameter: Newly dilated measuring 15.9 mm with intraluminal calculi measuring up to 11.5 mm. Liver: No focal lesion identified. Within normal limits in parenchymal echogenicity. The intrahepatic bile ducts are dilated. Portal vein is patent on color Doppler imaging with normal direction of blood flow towards the liver. Other: None. IMPRESSION: New choledocholithiasis with intrahepatic biliary dilatation. Stones within the common bile duct measure up to 11.5 mm. Sequela of cholelithiasis without cholecystitis. Electronically Signed   By: Manuel Horn M.D.   On: 01/20/2020 12:02      Impression / Plan:   Assessment: Active Problems:   Choledocholithiasis with obstruction   Nicotine dependence   Schizoaffective disorder (HCC)   Manuel Horn is a 39 y.o. y/o male with choledocholithiasis and elevated liver enzymes with a bilirubin of 24.  The patient has been to have gallstones in the past.  Plan:  The patient will be set up for an ERCP.  The patient has been explained the risks and benefit including pancreatitis, bleeding, perforation and death.  The patient states he understands the plan and will have his ERCP done today.  A surgical consultation should be obtained for a laparoscopic cholecystectomy.  Thank you for involving me in the care of this patient.      LOS: 0 days    Manuel Minium, MD, [facility] 01/20/2020, 3:44 PM,  Pager [phone] 7am-5pm  Check AMION for 5pm -7am coverage and on weekends   Note: This dictation was prepared with Dragon dictation along with smaller phrase technology. Any transcriptional errors that result from this process are unintentional.

## 2020-01-20 NOTE — ED Notes (Signed)
GI at bedside

## 2020-01-20 NOTE — H&P (Addendum)
History and Physical    Manuel Horn LOV:[id number] DOB: 1980/10/22 DOA: 01/20/2020  PCP: Lyndon Code, MD   Patient coming from: Home  I have personally briefly reviewed patient's old medical records in [facility]  Chief Complaint: Yellowish discoloration of his eyes HPI: Manuel Horn is a 39 y.o. male with medical history significant for nicotine dependence and schizoaffective disorder who was brought into the ER by his father for evaluation of yellow discoloration of his eyes which is further noted about 5 days ago.  He took the patient to see his primary care provider who had done some work-up as an outpatient.  Patient complains of nausea, vomiting as well as abdominal pain mostly in the right upper quadrant which is intermittent and is worse with meals.  He denies having any fever or chills.  He was diagnosed with gallstones in the past but declined a cholecystectomy.  During my evaluation he states that " he will want to have his gallstones taken out at this time". He denies having any chest pain, no shortness of breath, no dizziness, no lightheadedness, no urinary symptoms, no changes in his stool color or urine color, no headache, no cough. Labs show sodium 131, potassium 4.5, chloride 96, bicarb 26, glucose 195, BUN 8, calcium 9.5, albumin 3.4, lipase 34, AST 175, ALT 262, total protein 7.6, total bilirubin 24, Klose count 11.6, hemoglobin 12.2, hematocrit 35.9, MCV 100, RDW 12.2, platelet count 233 Respiratory viral panel is negative Abdominal ultrasound shows new choledocholithiasis with intrahepatic biliary dilatation.  Stones within the common bile duct measuring up to 11.5 mm.  Sequelae of cholelithiasis without cholecystitis.   ED Course: Patient is a 39 year old African-American male who presents to the ER for evaluation of yellow discoloration of his eyes and is noted to have cholestatic jaundice.  Ultrasound reveals choledocholithiasis without evidence of cholangitis or  cholecystitis.  GI has been consulted and he will be admitted to the hospital for further evaluation.  Review of Systems: As per HPI otherwise 10 point review of systems negative.    Past Medical History:  Diagnosis Date  . Schizoaffective disorder [facility])     Past Surgical History:  Procedure Laterality Date  . LEG SURGERY Left      reports that he has been smoking cigarettes. He has been smoking about 2.00 packs per day. He has never used smokeless tobacco. He reports that he does not drink alcohol and does not use drugs.  Allergies  Allergen Reactions  . Sulfa Antibiotics Rash    Family History  Problem Relation Age of Onset  . Diabetes Mother   . Hypertension Mother      Prior to Admission medications   Not on File    Physical Exam: Vitals:   01/20/20 1130 01/20/20 1200 01/20/20 1230 01/20/20 1300  BP: 112/64 101/76 102/78 104/69  Pulse: (!) 54 62 (!) 45 (!) 53  Resp:      Temp:      TempSrc:      SpO2: 100% 99% 97% 94%  Weight:      Height:         Vitals:   01/20/20 1130 01/20/20 1200 01/20/20 1230 01/20/20 1300  BP: 112/64 101/76 102/78 104/69  Pulse: (!) 54 62 (!) 45 (!) 53  Resp:      Temp:      TempSrc:      SpO2: 100% 99% 97% 94%  Weight:      Height:  Constitutional: NAD, alert and oriented x 3.  Looks younger than stated age Eyes: PERRL, lids and conjunctivae normal ENMT: Mucous membranes are moist.  Neck: normal, supple, no masses, no thyromegaly Respiratory: clear to auscultation bilaterally, no wheezing, no crackles. Normal respiratory effort. No accessory muscle use.  Cardiovascular: Regular rate and rhythm, no murmurs / rubs / gallops. No extremity edema. 2+ pedal pulses. No carotid bruits.  Abdomen: tenderness in right upper quadrant, no masses palpated. No hepatosplenomegaly. Bowel sounds positive.  Musculoskeletal: no clubbing / cyanosis. No joint deformity upper and lower extremities.  Skin: no rashes, lesions, ulcers.    Neurologic: No gross focal neurologic deficit. Psychiatric: Normal mood and affect.   Labs on Admission: I have personally reviewed following labs and imaging studies  CBC: Recent Labs  Lab 01/20/20 1011  WBC 9.9  HGB 12.3*  HCT RESULTS UNAVAILABLE DUE TO INTERFERING SUBSTANCE  MCV RESULTS UNAVAILABLE DUE TO INTERFERING SUBSTANCE  PLT 168   Basic Metabolic Panel: Recent Labs  Lab 01/20/20 1011  NA 131*  K 4.5  CL 96*  CO2 26  GLUCOSE 195*  BUN 8  CREATININE UNABLE TO REPORT DUE TO ICTERUS  CALCIUM 9.5   GFR: CrCl cannot be calculated (This lab value cannot be used to calculate CrCl because it is not a number: UNABLE TO REPORT DUE TO ICTERUS). Liver Function Tests: Recent Labs  Lab 01/20/20 1011  AST 175*  ALT 262*  ALKPHOS 379*  BILITOT 24.0*  PROT 7.6  ALBUMIN 3.4*   Recent Labs  Lab 01/20/20 1011  LIPASE 34   No results for input(s): AMMONIA in the last 168 hours. Coagulation Profile: No results for input(s): INR, PROTIME in the last 168 hours. Cardiac Enzymes: No results for input(s): CKTOTAL, CKMB, CKMBINDEX, TROPONINI in the last 168 hours. BNP (last 3 results) No results for input(s): PROBNP in the last 8760 hours. HbA1C: No results for input(s): HGBA1C in the last 72 hours. CBG: No results for input(s): GLUCAP in the last 168 hours. Lipid Profile: No results for input(s): CHOL, HDL, LDLCALC, TRIG, CHOLHDL, LDLDIRECT in the last 72 hours. Thyroid Function Tests: No results for input(s): TSH, T4TOTAL, FREET4, T3FREE, THYROIDAB in the last 72 hours. Anemia Panel: No results for input(s): VITAMINB12, FOLATE, FERRITIN, TIBC, IRON, RETICCTPCT in the last 72 hours. Urine analysis:    Component Value Date/Time   COLORURINE AMBER (A) 01/20/2020 1011   APPEARANCEUR HAZY (A) 01/20/2020 1011   APPEARANCEUR Cloudy (A) 11/26/2019 0947   LABSPEC 1.018 01/20/2020 1011   PHURINE 5.0 01/20/2020 1011   GLUCOSEU NEGATIVE 01/20/2020 1011   HGBUR NEGATIVE  01/20/2020 1011   BILIRUBINUR NEGATIVE 01/20/2020 1011   BILIRUBINUR Negative 11/26/2019 0947   KETONESUR NEGATIVE 01/20/2020 1011   PROTEINUR NEGATIVE 01/20/2020 1011   NITRITE NEGATIVE 01/20/2020 1011   LEUKOCYTESUR MODERATE (A) 01/20/2020 1011    Radiological Exams on Admission: US ABDOMEN LIMITED RUQ (LIVER/GB)  Result Date: 01/20/2020 CLINICAL DATA:  Jaundice EXAM: ULTRASOUND ABDOMEN LIMITED RIGHT UPPER QUADRANT COMPARISON:  11/28/2018 right upper quadrant ultrasound and CT renal stone. FINDINGS: Gallbladder: Multiple mobile intraluminal gallstones measuring up to 8.7 mm with biliary sludge. No gallbladder wall thickening. No pericholecystic free fluid. Sonographic Murphy sign was negative. Common bile duct: Diameter: Newly dilated measuring 15.9 mm with intraluminal calculi measuring up to 11.5 mm. Liver: No focal lesion identified. Within normal limits in parenchymal echogenicity. The intrahepatic bile ducts are dilated. Portal vein is patent on color Doppler imaging with normal direction of blood  flow towards the liver. Other: None. IMPRESSION: New choledocholithiasis with intrahepatic biliary dilatation. Stones within the common bile duct measure up to 11.5 mm. Sequela of cholelithiasis without cholecystitis. Electronically Signed   By: Stana Bunting M.D.   On: 01/20/2020 12:02    EKG: Independently reviewed.   Assessment/Plan Active Problems:   Choledocholithiasis with obstruction   Nicotine dependence   Schizoaffective disorder (HCC)     Choledocholithiasis with obstruction Patient with a history of cholelithiasis who presents for evaluation of yellowish discoloration of his eyes and is found to have choledocholithiasis without evidence of cholangitis or cholecystitis We will keep patient n.p.o. Pain control We will request GI consult for possible ERCP Patient will eventually require cholecystectomy   Nicotine dependence Smoking cessation has been discussed with  patient in detail He is a 2 pack a day smoking history We will place patient on nicotine transdermal patch 21 mg daily   Schizoaffective disorder Stable    DVT prophylaxis: SCD Code Status: Full code Family Communication: Greater than 50% of time was spent discussing patient's condition and plan of care with his father at the bedside.  All questions and concerns have been addressed.  He verbalizes understanding and agrees with the plan. Disposition Plan: Back to previous home environment Consults called: Gastroenterology    Lucile Shutters MD [facility]     01/20/2020, 3:16 PM

## 2020-01-20 NOTE — ED Triage Notes (Signed)
Pt states that he was diagnosed with gallstones approx 6 mos ago, pt states that he was told he may have to have surg, pt states that he is vomiting and noticed that his eyes are turning yellow

## 2020-01-20 NOTE — Anesthesia Postprocedure Evaluation (Signed)
Anesthesia Post Note  Patient: Manuel Horn  Procedure(s) Performed: ENDOSCOPIC RETROGRADE CHOLANGIOPANCREATOGRAPHY (ERCP) WITH PROPOFOL (N/A )  Patient location during evaluation: Endoscopy Anesthesia Type: General Level of consciousness: awake and alert Pain management: pain level controlled Vital Signs Assessment: post-procedure vital signs reviewed and stable Respiratory status: spontaneous breathing, nonlabored ventilation, respiratory function stable and patient connected to nasal cannula oxygen Cardiovascular status: blood pressure returned to baseline and stable Postop Assessment: no apparent nausea or vomiting Anesthetic complications: no   No complications documented.   Last Vitals:  Vitals:   01/20/20 1848 01/20/20 2000  BP: 105/72 (!) 113/53  Pulse: (!) 44 (!) 44  Resp: 15 16  Temp: (!) 36.3 C (!) 36.4 C  SpO2: 100% 100%    Last Pain:  Vitals:   01/20/20 2000  TempSrc: Oral  PainSc:                  Lenard Simmer

## 2020-01-20 NOTE — Transfer of Care (Signed)
Immediate Anesthesia Transfer of Care Note  Patient: Manuel Horn  Procedure(s) Performed: ENDOSCOPIC RETROGRADE CHOLANGIOPANCREATOGRAPHY (ERCP) WITH PROPOFOL (N/A )  Patient Location: Endoscopy Unit  Anesthesia Type:General  Level of Consciousness: drowsy  Airway & Oxygen Therapy: Patient Spontanous Breathing and Patient connected to face mask oxygen  Post-op Assessment: Report given to RN and Post -op Vital signs reviewed and stable  Post vital signs: Reviewed and stable  Last Vitals:  Vitals Value Taken Time  BP 101/62 01/20/20 1732  Temp 36.1 C 01/20/20 1732  Pulse 72 01/20/20 1736  Resp 10 01/20/20 1736  SpO2 99 % 01/20/20 1736  Vitals shown include unvalidated device data.  Last Pain:  Vitals:   01/20/20 1732  TempSrc: Temporal  PainSc: Asleep         Complications: No complications documented.

## 2020-01-21 ENCOUNTER — Inpatient Hospital Stay: Payer: Medicaid Other

## 2020-01-21 ENCOUNTER — Encounter: Payer: Self-pay | Admitting: Internal Medicine

## 2020-01-21 DIAGNOSIS — R17 Unspecified jaundice: Secondary | ICD-10-CM

## 2020-01-21 DIAGNOSIS — K8051 Calculus of bile duct without cholangitis or cholecystitis with obstruction: Secondary | ICD-10-CM | POA: Diagnosis not present

## 2020-01-21 HISTORY — PX: IR PERCUTANEOUS TRANSHEPATIC CHOLANGIOGRAM: IMG6042

## 2020-01-21 LAB — BASIC METABOLIC PANEL
Anion gap: 7 (ref 5–15)
BUN: 10 mg/dL (ref 6–20)
CO2: 26 mmol/L (ref 22–32)
Calcium: 8.7 mg/dL — ABNORMAL LOW (ref 8.9–10.3)
Chloride: 101 mmol/L (ref 98–111)
Creatinine, Ser: <0.3 mg/dL — ABNORMAL LOW (ref 0.61–1.24)
Glucose, Bld: 88 mg/dL (ref 70–99)
Potassium: UNDETERMINED mmol/L (ref 3.5–5.1)
Sodium: 134 mmol/L — ABNORMAL LOW (ref 135–145)

## 2020-01-21 LAB — HEPATIC FUNCTION PANEL
ALT: 212 U/L — ABNORMAL HIGH (ref 0–44)
AST: 159 U/L — ABNORMAL HIGH (ref 15–41)
Albumin: 2.8 g/dL — ABNORMAL LOW (ref 3.5–5.0)
Alkaline Phosphatase: 290 U/L — ABNORMAL HIGH (ref 38–126)
Bilirubin, Direct: 12.2 mg/dL — ABNORMAL HIGH (ref 0.0–0.2)
Indirect Bilirubin: 8.3 mg/dL
Total Bilirubin: 20.5 mg/dL (ref 0.3–1.2)
Total Protein: 5.7 g/dL — ABNORMAL LOW (ref 6.5–8.1)

## 2020-01-21 LAB — CBC
Hemoglobin: 10.1 g/dL — ABNORMAL LOW (ref 13.0–17.0)
Platelets: 123 10*3/uL — ABNORMAL LOW (ref 150–400)
WBC: 7.3 10*3/uL (ref 4.0–10.5)

## 2020-01-21 MED ORDER — IODIXANOL 320 MG/ML IV SOLN
50.0000 mL | Freq: Once | INTRAVENOUS | Status: AC | PRN
  Administered 2020-01-21: 15 mL

## 2020-01-21 MED ORDER — FENTANYL CITRATE (PF) 100 MCG/2ML IJ SOLN
INTRAMUSCULAR | Status: AC
  Filled 2020-01-21: qty 2

## 2020-01-21 MED ORDER — LACTATED RINGERS IV BOLUS
1000.0000 mL | Freq: Once | INTRAVENOUS | Status: AC
  Administered 2020-01-21: 1000 mL via INTRAVENOUS

## 2020-01-21 MED ORDER — MIDAZOLAM HCL 2 MG/2ML IJ SOLN
INTRAMUSCULAR | Status: AC | PRN
  Administered 2020-01-21: 1 mg via INTRAVENOUS

## 2020-01-21 MED ORDER — PIPERACILLIN-TAZOBACTAM 3.375 G IVPB 30 MIN
3.3750 g | Freq: Four times a day (QID) | INTRAVENOUS | Status: DC
  Administered 2020-01-21: 3.375 g via INTRAVENOUS
  Filled 2020-01-21 (×4): qty 50

## 2020-01-21 MED ORDER — NICOTINE 21 MG/24HR TD PT24
21.0000 mg | MEDICATED_PATCH | Freq: Every day | TRANSDERMAL | Status: DC
  Administered 2020-01-22 – 2020-01-24 (×3): 21 mg via TRANSDERMAL
  Filled 2020-01-21 (×4): qty 1

## 2020-01-21 MED ORDER — INDOCYANINE GREEN 25 MG IV SOLR
2.5000 mg | INTRAVENOUS | Status: AC
  Administered 2020-01-22: 2.5 mg via INTRAVENOUS
  Filled 2020-01-21: qty 1

## 2020-01-21 MED ORDER — MIDAZOLAM HCL 2 MG/2ML IJ SOLN
INTRAMUSCULAR | Status: AC
  Filled 2020-01-21: qty 2

## 2020-01-21 MED ORDER — PIPERACILLIN-TAZOBACTAM 3.375 G IVPB
3.3750 g | Freq: Three times a day (TID) | INTRAVENOUS | Status: DC
  Administered 2020-01-21 – 2020-01-25 (×11): 3.375 g via INTRAVENOUS
  Filled 2020-01-21 (×11): qty 50

## 2020-01-21 MED ORDER — FENTANYL CITRATE (PF) 100 MCG/2ML IJ SOLN
INTRAMUSCULAR | Status: AC | PRN
Start: 2020-01-21 — End: 2020-01-21
  Administered 2020-01-21: 50 ug via INTRAVENOUS

## 2020-01-21 NOTE — Procedures (Signed)
Interventional Radiology Procedure:   Indications: Biliary obstruction secondary to stones  Procedure: PTC and drain placement  Findings: Severe intrahepatic and extrahepatic biliary dilatation with distal CBD obstruction.  Evidence for biliary stones. 10 Fr internal-external biliary drain placed via left hepatic lobe.  Drain tip in duodenum.  Complications: None     EBL: less than 10 ml  Plan: Follow drain output and labs.     Hernandez Losasso R. Anselm Pancoast, MD  Pager: [phone]

## 2020-01-21 NOTE — Consult Note (Signed)
Chief Complaint: Patient was seen in consultation today for  Chief Complaint  Patient presents with  . Abdominal Pain   at the request of Dr. Allen Norris  Referring Physician(s): Wohl  Patient Status: [facility] - In-pt  History of Present Illness: Manuel Horn is a 39 y.o. male with history of gallstones. Patient presented to ED with nausea, vomiting,abdominal and jaundice.  US demonstrated intrahepatic biliary dilatation with choledocholithiasis and cholelithiasis.  Patient underwent ERCP yesterday but unfortunately the common bile duct could not be cannulated and unable to place an internal stent.  Patient was referred for a percutaneous transhepatic cholangiogram with drain placement.  Patient is feeling much better this morning.  He is not complaining of abdominal pain.  He denies chest pain or breathing problems.  Past Medical History:  Diagnosis Date  . Schizoaffective disorder [facility])     Past Surgical History:  Procedure Laterality Date  . LEG SURGERY Left     Allergies: Sulfa antibiotics  Medications: Prior to Admission medications   Not on File     Family History  Problem Relation Age of Onset  . Diabetes Mother   . Hypertension Mother     Social History   Socioeconomic History  . Marital status: Single    Spouse name: Not on file  . Number of children: 0  . Years of education: Not on file  . Highest education level: Not on file  Occupational History  . Occupation: disability  Tobacco Use  . Smoking status: Current Every Day Smoker    Packs/day: 2.00    Types: Cigarettes  . Smokeless tobacco: Never Used  Vaping Use  . Vaping Use: Never used  Substance and Sexual Activity  . Alcohol use: No  . Drug use: No  . Sexual activity: Not on file  Other Topics Concern  . Not on file  Social History Narrative  . Not on file   Social Determinants of Health   Financial Resource Strain:   . Difficulty of Paying Living Expenses: Not on file  Food Insecurity:    . Worried About Charity fundraiser in the Last Year: Not on file  . Ran Out of Food in the Last Year: Not on file  Transportation Needs:   . Lack of Transportation (Medical): Not on file  . Lack of Transportation (Non-Medical): Not on file  Physical Activity:   . Days of Exercise per Week: Not on file  . Minutes of Exercise per Session: Not on file  Stress:   . Feeling of Stress : Not on file  Social Connections:   . Frequency of Communication with Friends and Family: Not on file  . Frequency of Social Gatherings with Friends and Family: Not on file  . Attends Religious Services: Not on file  . Active Member of Clubs or Organizations: Not on file  . Attends Archivist Meetings: Not on file  . Marital Status: Not on file      Review of Systems  Constitutional: Negative for chills and fever.  Cardiovascular: Negative.   Gastrointestinal: Positive for abdominal pain.  Genitourinary: Negative.     Vital Signs: BP 94/63   Pulse (!) 50   Temp 98.9 F (37.2 C) (Oral)   Resp 15   Ht 6' (1.829 m)   Wt 68.9 kg   SpO2 99%   BMI 20.60 kg/m   Physical Exam Constitutional:      Appearance: He is well-developed.  Cardiovascular:     Rate  and Rhythm: Normal rate and regular rhythm.     Heart sounds: Normal heart sounds.  Pulmonary:     Effort: Pulmonary effort is normal.     Breath sounds: Normal breath sounds.  Abdominal:     General: Abdomen is flat. Bowel sounds are normal. There is no distension.     Palpations: Abdomen is soft.     Tenderness: There is no abdominal tenderness.  Neurological:     Mental Status: He is alert.     Imaging: DG C-Arm 1-60 Min-No Report  Result Date: 01/20/2020 Fluoroscopy was utilized by the requesting physician.  No radiographic interpretation.   US ABDOMEN LIMITED RUQ (LIVER/GB)  Result Date: 01/20/2020 CLINICAL DATA:  Jaundice EXAM: ULTRASOUND ABDOMEN LIMITED RIGHT UPPER QUADRANT COMPARISON:  11/28/2018 right upper  quadrant ultrasound and CT renal stone. FINDINGS: Gallbladder: Multiple mobile intraluminal gallstones measuring up to 8.7 mm with biliary sludge. No gallbladder wall thickening. No pericholecystic free fluid. Sonographic Murphy sign was negative. Common bile duct: Diameter: Newly dilated measuring 15.9 mm with intraluminal calculi measuring up to 11.5 mm. Liver: No focal lesion identified. Within normal limits in parenchymal echogenicity. The intrahepatic bile ducts are dilated. Portal vein is patent on color Doppler imaging with normal direction of blood flow towards the liver. Other: None. IMPRESSION: New choledocholithiasis with intrahepatic biliary dilatation. Stones within the common bile duct measure up to 11.5 mm. Sequela of cholelithiasis without cholecystitis. Electronically Signed   By: Primitivo Gauze M.D.   On: 01/20/2020 12:02    Labs:  CBC: Recent Labs    11/26/19 0949 12/26/19 1448 01/20/20 1011 01/21/20 0426  WBC 12.0* 11.6* 9.9 7.3  HGB 14.3 12.2* 12.3* 10.1*  HCT 41.5 35.9* RESULTS UNAVAILABLE DUE TO INTERFERING SUBSTANCE RESULTS UNAVAILABLE DUE TO INTERFERING SUBSTANCE  PLT 212 233 168 123*    COAGS: Recent Labs    01/20/20 1856  INR 1.0    BMP: Recent Labs    11/26/19 0949 01/20/20 1011 01/21/20 0426  NA 142 131* 134*  K 4.8 4.5 UNABLE TO REPORT DUE TO ICTERUS  CL 102 96* 101  CO2 '25 26 26  ' GLUCOSE 108* 195* 88  BUN '8 8 10  ' CALCIUM 10.0 9.5 8.7*  CREATININE 1.24 UNABLE TO REPORT DUE TO ICTERUS <0.30*  GFRNONAA 73 NOT CALCULATED NOT CALCULATED  GFRAA 84  --   --     LIVER FUNCTION TESTS: Recent Labs    11/26/19 0949 01/20/20 1011 01/21/20 0426  BILITOT 1.0 24.0* 20.5*  AST 18 175* 159*  ALT 13 262* 212*  ALKPHOS 90 379* 290*  PROT 7.5 7.6 5.7*  ALBUMIN 4.2 3.4* 2.8*    TUMOR MARKERS: No results for input(s): AFPTM, CEA, CA199, CHROMGRNA in the last 8760 hours.  Assessment and Plan:  39 year old with jaundice and evidence of biliary  obstruction based on ultrasound.  Biliary obstruction is likely secondary to gallstones.  ERCP was unsuccessful and unable to decompress the biliary system.  Interventional radiology was consulted for PTC and drain placement.  Discussed PTC and drain placement with patient and family.  Patient understands that he will likely go home with the drain and will need to be scheduled for stone removal and probable rendezvous procedure using ERCP in 1 to 2 weeks.  Risks and benefits of percutaneous transhepatic cholangiogram and drain placement were discussed with the patient including, but not limited to bleeding, infection which may lead to sepsis or even death and damage to adjacent structures.  This interventional procedure  involves the use of X-rays and because of the nature of the planned procedure, it is possible that we will have prolonged use of X-ray fluoroscopy.  Potential radiation risks to you include (but are not limited to) the following: - A slightly elevated risk for cancer  several years later in life. This risk is typically less than 0.5% percent. This risk is low in comparison to the normal incidence of human cancer, which is 33% for women and 50% for men according to the [employer]. - Radiation induced injury can include skin redness, resembling a rash, tissue breakdown / ulcers and hair loss (which can be temporary or permanent).   The likelihood of either of these occurring depends on the difficulty of the procedure and whether you are sensitive to radiation due to previous procedures, disease, or genetic conditions.   IF your procedure requires a prolonged use of radiation, you will be notified and given written instructions for further action.  It is your responsibility to monitor the irradiated area for the 2 weeks following the procedure and to notify your physician if you are concerned that you have suffered a radiation induced injury.    All of the patient's  questions were answered, patient is agreeable to proceed.  Consent signed and in chart.   Thank you for this interesting consult.  I greatly enjoyed meeting Manuel Horn and look forward to participating in their care.  A copy of this report was sent to the requesting provider on this date.  Electronically Signed: Burman Riis, MD 01/21/2020, 9:21 AM   I spent a total of 20 Minutes    in face to face in clinical consultation, greater than 50% of which was counseling/coordinating care for biliary obstruction and jaundice.

## 2020-01-21 NOTE — Consult Note (Signed)
Date of Consultation:  01/21/2020  Requesting Physician:  Collier Bullock, MD  Reason for Consultation:  Choledocholithiasis  History of Present Illness: Manuel Horn is a 39 y.o. male with a history of cholelithiasis, admitted yesterday with abdominal pain and jaundice, noted to have significantly elevated total bilirubin of 24.  U/S showed cholelithiasis and choledocholithiasis, with CBD diameter of 15.9 mm, without cholecystitis.  He underwent attempted ERCP yesterday with Dr. Allen Norris, but was unable to cannulate the common bile duct.  Today, he just underwent successful PTC with Dr. Anselm Pancoast with interventional radioloagy, and a drain was placed transhepatically into the common bile duct and duodenum.  This afternoon, he reports that he feels better without significant pain or nausea and has tolerated a regular diet.  He does endorse some pain at the drain insertion site itself.  The patient had been seen by Dr. Lysle Pearl about a year ago, but the patient declined surgery at the time.    Past Medical History: Past Medical History:  Diagnosis Date  . Schizoaffective disorder [facility])      Past Surgical History: Past Surgical History:  Procedure Laterality Date  . IR PERCUTANEOUS TRANSHEPATIC CHOLANGIOGRAM  01/21/2020  . LEG SURGERY Left     Home Medications: Prior to Admission medications   Not on File    Allergies: Allergies  Allergen Reactions  . Sulfa Antibiotics Rash    Social History:  reports that he has been smoking cigarettes. He has been smoking about 2.00 packs per day. He has never used smokeless tobacco. He reports that he does not drink alcohol and does not use drugs.   Family History: Family History  Problem Relation Age of Onset  . Diabetes Mother   . Hypertension Mother     Review of Systems: Review of Systems  Constitutional: Negative for chills and fever.  HENT: Negative for hearing loss.   Respiratory: Negative for shortness of breath.   Cardiovascular:  Negative for chest pain.  Gastrointestinal: Positive for abdominal pain, nausea and vomiting. Negative for constipation and diarrhea.  Genitourinary: Negative for dysuria.  Musculoskeletal: Negative for myalgias.  Skin:       jaundice  Neurological: Negative for dizziness.  Psychiatric/Behavioral: Negative for depression.    Physical Exam BP (!) 89/56 (BP Location: Left Arm)   Pulse (!) 48   Temp 98.4 F (36.9 C) (Oral)   Resp 14   Ht 6' (1.829 m)   Wt 68.9 kg   SpO2 98%   BMI 20.60 kg/m  CONSTITUTIONAL: No acute distress HEENT:  Normocephalic, atraumatic, extraocular motion intact. NECK: Trachea is midline, and there is no jugular venous distension. RESPIRATORY:  Normal respiratory effort without pathologic use of accessory muscles. CARDIOVASCULAR:  Regular rhythm and rate. GI: The abdomen is soft, non-distended, with only some discomfort at the drain insertion site.  Drain with bilious fluid in bag.  Dressing clean, dry, intact.  MUSCULOSKELETAL:  Normal muscle strength and tone in all four extremities.  No peripheral edema or cyanosis. SKIN:  Jaundiced.  NEUROLOGIC:  Motor and sensation is grossly normal.  Cranial nerves are grossly intact. PSYCH:  Alert and oriented to person, place and time. Affect is normal.  Laboratory Analysis: Results for orders placed or performed during the hospital encounter of 01/20/20 (from the past 24 hour(s))  Protime-INR     Status: None   Collection Time: 01/20/20  6:56 PM  Result Value Ref Range   Prothrombin Time 12.9 11.4 - 15.2 seconds   INR 1.0 0.8 -  1.2  CBC     Status: Abnormal   Collection Time: 01/21/20  4:26 AM  Result Value Ref Range   WBC 7.3 4.0 - 10.5 K/uL   RBC RESULTS UNAVAILABLE DUE TO INTERFERING SUBSTANCE 4.22 - 5.81 MIL/uL   Hemoglobin 10.1 (L) 13.0 - 17.0 g/dL   HCT RESULTS UNAVAILABLE DUE TO INTERFERING SUBSTANCE 39 - 52 %   MCV RESULTS UNAVAILABLE DUE TO INTERFERING SUBSTANCE 80.0 - 100.0 fL   MCH RESULTS  UNAVAILABLE DUE TO INTERFERING SUBSTANCE 26.0 - 34.0 pg   MCHC RESULTS UNAVAILABLE DUE TO INTERFERING SUBSTANCE 30.0 - 36.0 g/dL   RDW RESULTS UNAVAILABLE DUE TO INTERFERING SUBSTANCE 11.5 - 15.5 %   Platelets 123 (L) 150 - 400 K/uL   nRBC RESULTS UNAVAILABLE DUE TO INTERFERING SUBSTANCE 0.0 - 0.2 %  Basic metabolic panel     Status: Abnormal   Collection Time: 01/21/20  4:26 AM  Result Value Ref Range   Sodium 134 (L) 135 - 145 mmol/L   Potassium UNABLE TO REPORT DUE TO ICTERUS 3.5 - 5.1 mmol/L   Chloride 101 98 - 111 mmol/L   CO2 26 22 - 32 mmol/L   Glucose, Bld 88 70 - 99 mg/dL   BUN 10 6 - 20 mg/dL   Creatinine, Ser <0.30 (L) 0.61 - 1.24 mg/dL   Calcium 8.7 (L) 8.9 - 10.3 mg/dL   GFR, Estimated NOT CALCULATED >60 mL/min   Anion gap 7 5 - 15  Hepatic function panel     Status: Abnormal   Collection Time: 01/21/20  4:26 AM  Result Value Ref Range   Total Protein 5.7 (L) 6.5 - 8.1 g/dL   Albumin 2.8 (L) 3.5 - 5.0 g/dL   AST 159 (H) 15 - 41 U/L   ALT 212 (H) 0 - 44 U/L   Alkaline Phosphatase 290 (H) 38 - 126 U/L   Total Bilirubin 20.5 (HH) 0.3 - 1.2 mg/dL   Bilirubin, Direct 12.2 (H) 0.0 - 0.2 mg/dL   Indirect Bilirubin 8.3 mg/dL    Imaging: DG C-Arm 1-60 Min-No Report  Result Date: 01/20/2020 Fluoroscopy was utilized by the requesting physician.  No radiographic interpretation.   IR PERCUTANEOUS TRANSHEPATIC CHOLANGIOGRAM  Result Date: 01/21/2020 INDICATION: 39 year old with jaundice and biliary obstruction. Biliary obstruction secondary to stones. ERCP was unable to decompress the biliary system. Plan for PTC and drain placement. EXAM: 1. PERCUTANEOUS TRANSHEPATIC CHOLANGIOGRAM USING ULTRASOUND AND FLUOROSCOPIC GUIDANCE 2. PLACEMENT OF INTERNAL/EXTERNAL BILIARY DRAIN MEDICATIONS: Zosyn 3.375 g; The antibiotic was administered within an appropriate time frame prior to the initiation of the procedure. ANESTHESIA/SEDATION: Moderate (conscious) sedation was employed during this  procedure. A total of Versed 1.0 mg and Fentanyl 50 mcg was administered intravenously. Moderate Sedation Time: 22 minutes minutes. The patient's level of consciousness and vital signs were monitored continuously by radiology nursing throughout the procedure under my direct supervision. FLUOROSCOPY TIME:  Fluoroscopy Time: 4 minutes 30 seconds (26.2 mGy). COMPLICATIONS: None immediate. PROCEDURE: Informed written consent was obtained from the patient after a thorough discussion of the procedural risks, benefits and alternatives. All questions were addressed. A timeout was performed prior to the initiation of the procedure. The anterior and right side of the abdomen was prepped and draped in sterile fashion. Maximal barrier sterile technique was utilized including caps, mask, sterile gowns, sterile gloves, sterile drape, hand hygiene and skin antiseptic. Ultrasound demonstrated markedly dilated bile ducts in the left hepatic lobe. Ultrasound image was saved for documentation. Skin was anesthetized using  1% lidocaine. Using ultrasound guidance, 21 gauge needle was directed into a dilated left hepatic bile duct. Wire was easily advanced into the biliary system and a transitional dilator set was placed. Cholangiogram images were obtained through the catheter. A Bentson wire was placed and a C2 catheter was advanced into the distal common bile duct and eventually into the duodenum using a Glidewire. A straight glide catheter was advanced into the distal duodenum over a Bentson wire. The tract was dilated to accommodate a 10 [place] biliary drain. Drain was advanced into the duodenum. Additional cholangiogram images were obtained. Large amount of clear yellowish bile was removed from the biliary system. Catheter was secured to skin with Prolene suture. The drain was flushed with saline and attached to a gravity bag. Fluoroscopic and ultrasound images were taken and saved for documentation. FINDINGS: Severe intrahepatic and  extrahepatic biliary dilatation. Common bile duct is markedly dilated with a distal common bile duct obstruction. Cystic duct is patent and evidence for cholelithiasis. Drain was successfully advanced into the duodenum. Final image demonstrates stones within the common bile duct. IMPRESSION: 1. Severe intrahepatic and extrahepatic biliary dilatation due to obstructing stones. 2. Successful percutaneous transhepatic cholangiogram and drain placement. Drain extends into the duodenum. Electronically Signed   By: Markus Daft M.D.   On: 01/21/2020 10:53    Assessment and Plan: This is a 39 y.o. male presenting with choledocholithiasis, s/p PTC today with IR with drainage placement into the duodenum.  --Discussed with the patient and his mother about the findings on his initial ultrasound, ERCP attempt, and today's PTC drain placement.  Overall, the drain is helping decompress the biliary tree, though he still has gallstones in his common bile duct.  Eventually, Dr. Allen Norris and IR will coordinate to attempt ERCP again and clear the CBD. --Discussed with them about timing for cholecystectomy and gave the options of doing cholecystectomy during this admission vs as outpatient after his CBD is cleared.  They prefer to do surgery this admission.  I would agree since he had declined surgery last year and was lost to follow up as outpatient.  His drain should not interfere with the surgery.  Reviewed risks of bleeding, infection, and injury to surrounding structures, and he's willing to proceed.  Will schedule for robotic cholecystectomy tomorrow afternoon. --NPO after midnight.  Consent order placed.  Face-to-face time spent with the patient and care providers was 80 minutes, with more than 50% of the time spent counseling, educating, and coordinating care of the patient.     Melvyn Neth, MD [facility] Pg:  [phone]

## 2020-01-21 NOTE — Progress Notes (Signed)
PROGRESS NOTE    Manuel Horn  EML:[id number] DOB: May 18, 1980 DOA: 01/20/2020 PCP: Lavera Guise, MD  Brief Narrative: 39 year old male presented to the emergency department for evaluation of scleral icterus 5 days duration also complains of nausea, vomiting, abdominal pain.  Patient has a history of cholelithiasis but declined previous cholecystectomy.  Patient was found to have evidence of choledocholithiasis without evidence of acute cholangitis.  Underwent ERCP on 01/20/2020.  Procedure was prematurely aborted due to procedural difficulty.  Patient was evaluated by interventional radiology who recommended proceeding with PTC with drain placement  Patient underwent this procedure successfully on 01/21/2020.  Plan to maintain drain for period of 1 to 2 weeks and repeat transhepatic approach to ERCP later date.  General surgery on consult for cholecystectomy.   Assessment & Plan:   Active Problems:   Choledocholithiasis with obstruction   Nicotine dependence   Schizoaffective disorder ([place])   Jaundice  Acute choledocholithiasis Obstructive jaundice Known history of cholelithiasis 5 days of scleral icterus Status post ERCP attempt 01/20/20 Status post PTC with drain placement 01/21/2020 Plan: Can advance diet post procedurally Pain control as needed Continue Zosyn empirically for now Continue biliary drain Continue IV fluids, decrease rate if patient tolerating p.o.  Nicotine dependence Encourage smoking cessation NicoDerm patch  Schizoaffective disorder Appears stable.  No medications reflected on home MAR   DVT prophylaxis: SCD Code Status: Full Family Communication: Mother at bedside Disposition Plan: Status is: Inpatient  Remains inpatient appropriate because:Inpatient level of care appropriate due to severity of illness   Dispo: The patient is from: Home              Anticipated d/c is to: Home              Anticipated d/c date is: 2 days              Patient  currently is not medically stable to d/c.  Still with evidence of hyperbilirubinemia and obstructive jaundice.  Now status post biliary drain placement.  Will need consistent downtrend liver function tests and bilirubin prior to disposition planning.  Anticipate 48 hours       Consultants:   GI  IR  General surgery  Procedures:  ERCP, 01/20/20  PTC with biliary drain, 01/21/2020  Antimicrobials:   Zosyn   Subjective: Seen and examined.  Endorses right upper quadrant abdominal pain and nausea.  No other complaints  Objective: Vitals:   01/21/20 1031 01/21/20 1045 01/21/20 1115 01/21/20 1139  BP: 1'01/64 98/67 99/65 ' (!) 89/56  Pulse: (!) 53 (!) 50 (!) 49 (!) 48  Resp: '16 15 17 14  ' Temp:    98.4 F (36.9 C)  TempSrc:    Oral  SpO2: 96% 96% 98% 98%  Weight:      Height:        Intake/Output Summary (Last 24 hours) at 01/21/2020 1341 Last data filed at 01/21/2020 0844 Gross per 24 hour  Intake 3059.52 ml  Output 650 ml  Net 2409.52 ml   Filed Weights   01/20/20 1538 01/20/20 1848 01/21/20 0900  Weight: 68.9 kg 68.9 kg 68.9 kg    Examination:  General exam: Appears calm and comfortable  Respiratory system: Clear to auscultation. Respiratory effort normal. Cardiovascular system: S1 & S2 heard, RRR. No JVD, murmurs, rubs, gallops or clicks. No pedal edema. Gastrointestinal system: Right upper quadrant tenderness Central nervous system: Alert and oriented. No focal neurological deficits. Extremities: Symmetric 5 x 5 power. Skin: Scleral icterus bilaterally  Psychiatry: Judgement and insight appear normal. Mood & affect appropriate.     Data Reviewed: I have personally reviewed following labs and imaging studies  CBC: Recent Labs  Lab 01/20/20 1011 01/21/20 0426  WBC 9.9 7.3  HGB 12.3* 10.1*  HCT RESULTS UNAVAILABLE DUE TO INTERFERING SUBSTANCE RESULTS UNAVAILABLE DUE TO INTERFERING SUBSTANCE  MCV RESULTS UNAVAILABLE DUE TO INTERFERING SUBSTANCE RESULTS  UNAVAILABLE DUE TO INTERFERING SUBSTANCE  PLT 168 800*   Basic Metabolic Panel: Recent Labs  Lab 01/20/20 1011 01/21/20 0426  NA 131* 134*  K 4.5 UNABLE TO REPORT DUE TO ICTERUS  CL 96* 101  CO2 26 26  GLUCOSE 195* 88  BUN 8 10  CREATININE UNABLE TO REPORT DUE TO ICTERUS <0.30*  CALCIUM 9.5 8.7*   GFR: CrCl cannot be calculated (This lab value cannot be used to calculate CrCl because it is not a number: <0.30). Liver Function Tests: Recent Labs  Lab 01/20/20 1011 01/21/20 0426  AST 175* 159*  ALT 262* 212*  ALKPHOS 379* 290*  BILITOT 24.0* 20.5*  PROT 7.6 5.7*  ALBUMIN 3.4* 2.8*   Recent Labs  Lab 01/20/20 1011  LIPASE 34   No results for input(s): AMMONIA in the last 168 hours. Coagulation Profile: Recent Labs  Lab 01/20/20 1856  INR 1.0   Cardiac Enzymes: No results for input(s): CKTOTAL, CKMB, CKMBINDEX, TROPONINI in the last 168 hours. BNP (last 3 results) No results for input(s): PROBNP in the last 8760 hours. HbA1C: No results for input(s): HGBA1C in the last 72 hours. CBG: No results for input(s): GLUCAP in the last 168 hours. Lipid Profile: No results for input(s): CHOL, HDL, LDLCALC, TRIG, CHOLHDL, LDLDIRECT in the last 72 hours. Thyroid Function Tests: No results for input(s): TSH, T4TOTAL, FREET4, T3FREE, THYROIDAB in the last 72 hours. Anemia Panel: No results for input(s): VITAMINB12, FOLATE, FERRITIN, TIBC, IRON, RETICCTPCT in the last 72 hours. Sepsis Labs: No results for input(s): PROCALCITON, LATICACIDVEN in the last 168 hours.  Recent Results (from the past 240 hour(s))  Respiratory Panel by RT PCR (Flu A&B, Covid) - Nasopharyngeal Swab     Status: None   Collection Time: 01/20/20 12:20 PM   Specimen: Nasopharyngeal Swab  Result Value Ref Range Status   SARS Coronavirus 2 by RT PCR NEGATIVE NEGATIVE Final    Comment: (NOTE) SARS-CoV-2 target nucleic acids are NOT DETECTED.  The SARS-CoV-2 RNA is generally detectable in upper  respiratoy specimens during the acute phase of infection. The lowest concentration of SARS-CoV-2 viral copies this assay can detect is 131 copies/mL. A negative result does not preclude SARS-Cov-2 infection and should not be used as the sole basis for treatment or other patient management decisions. A negative result may occur with  improper specimen collection/handling, submission of specimen other than nasopharyngeal swab, presence of viral mutation(s) within the areas targeted by this assay, and inadequate number of viral copies (<131 copies/mL). A negative result must be combined with clinical observations, patient history, and epidemiological information. The expected result is Negative.  Fact Sheet for Patients:  PinkCheek.be  Fact Sheet for Healthcare Providers:  GravelBags.it  This test is no t yet approved or cleared by the [place] FDA and  has been authorized for detection and/or diagnosis of SARS-CoV-2 by FDA under an Emergency Use Authorization (EUA). This EUA will remain  in effect (meaning this test can be used) for the duration of the COVID-19 declaration under Section 564(b)(1) of the Act, 21 U.S.C. section 360bbb-3(b)(1), unless the authorization  is terminated or revoked sooner.     Influenza A by PCR NEGATIVE NEGATIVE Final   Influenza B by PCR NEGATIVE NEGATIVE Final    Comment: (NOTE) The Xpert Xpress SARS-CoV-2/FLU/RSV assay is intended as an aid in  the diagnosis of influenza from Nasopharyngeal swab specimens and  should not be used as a sole basis for treatment. Nasal washings and  aspirates are unacceptable for Xpert Xpress SARS-CoV-2/FLU/RSV  testing.  Fact Sheet for Patients: PinkCheek.be  Fact Sheet for Healthcare Providers: GravelBags.it  This test is not yet approved or cleared by the [place] FDA and  has been  authorized for detection and/or diagnosis of SARS-CoV-2 by  FDA under an Emergency Use Authorization (EUA). This EUA will remain  in effect (meaning this test can be used) for the duration of the  Covid-19 declaration under Section 564(b)(1) of the Act, 21  U.S.C. section 360bbb-3(b)(1), unless the authorization is  terminated or revoked. Performed at [facility], [address] [place], [place] [address]          Radiology Studies: DG C-Arm 1-60 Min-No Report  Result Date: 01/20/2020 Fluoroscopy was utilized by the requesting physician.  No radiographic interpretation.   IR PERCUTANEOUS TRANSHEPATIC CHOLANGIOGRAM  Result Date: 01/21/2020 INDICATION: 39 year old with jaundice and biliary obstruction. Biliary obstruction secondary to stones. ERCP was unable to decompress the biliary system. Plan for PTC and drain placement. EXAM: 1. PERCUTANEOUS TRANSHEPATIC CHOLANGIOGRAM USING ULTRASOUND AND FLUOROSCOPIC GUIDANCE 2. PLACEMENT OF INTERNAL/EXTERNAL BILIARY DRAIN MEDICATIONS: Zosyn 3.375 g; The antibiotic was administered within an appropriate time frame prior to the initiation of the procedure. ANESTHESIA/SEDATION: Moderate (conscious) sedation was employed during this procedure. A total of Versed 1.0 mg and Fentanyl 50 mcg was administered intravenously. Moderate Sedation Time: 22 minutes minutes. The patient's level of consciousness and vital signs were monitored continuously by radiology nursing throughout the procedure under my direct supervision. FLUOROSCOPY TIME:  Fluoroscopy Time: 4 minutes 30 seconds (26.2 mGy). COMPLICATIONS: None immediate. PROCEDURE: Informed written consent was obtained from the patient after a thorough discussion of the procedural risks, benefits and alternatives. All questions were addressed. A timeout was performed prior to the initiation of the procedure. The anterior and right side of the abdomen was prepped and draped in sterile fashion. Maximal  barrier sterile technique was utilized including caps, mask, sterile gowns, sterile gloves, sterile drape, hand hygiene and skin antiseptic. Ultrasound demonstrated markedly dilated bile ducts in the left hepatic lobe. Ultrasound image was saved for documentation. Skin was anesthetized using 1% lidocaine. Using ultrasound guidance, 21 gauge needle was directed into a dilated left hepatic bile duct. Wire was easily advanced into the biliary system and a transitional dilator set was placed. Cholangiogram images were obtained through the catheter. A Bentson wire was placed and a C2 catheter was advanced into the distal common bile duct and eventually into the duodenum using a Glidewire. A straight glide catheter was advanced into the distal duodenum over a Bentson wire. The tract was dilated to accommodate a 10 [place] biliary drain. Drain was advanced into the duodenum. Additional cholangiogram images were obtained. Large amount of clear yellowish bile was removed from the biliary system. Catheter was secured to skin with Prolene suture. The drain was flushed with saline and attached to a gravity bag. Fluoroscopic and ultrasound images were taken and saved for documentation. FINDINGS: Severe intrahepatic and extrahepatic biliary dilatation. Common bile duct is markedly dilated with a distal common bile duct obstruction. Cystic duct is patent and  evidence for cholelithiasis. Drain was successfully advanced into the duodenum. Final image demonstrates stones within the common bile duct. IMPRESSION: 1. Severe intrahepatic and extrahepatic biliary dilatation due to obstructing stones. 2. Successful percutaneous transhepatic cholangiogram and drain placement. Drain extends into the duodenum. Electronically Signed   By: Markus Daft M.D.   On: 01/21/2020 10:53   US ABDOMEN LIMITED RUQ (LIVER/GB)  Result Date: 01/20/2020 CLINICAL DATA:  Jaundice EXAM: ULTRASOUND ABDOMEN LIMITED RIGHT UPPER QUADRANT COMPARISON:  11/28/2018  right upper quadrant ultrasound and CT renal stone. FINDINGS: Gallbladder: Multiple mobile intraluminal gallstones measuring up to 8.7 mm with biliary sludge. No gallbladder wall thickening. No pericholecystic free fluid. Sonographic Murphy sign was negative. Common bile duct: Diameter: Newly dilated measuring 15.9 mm with intraluminal calculi measuring up to 11.5 mm. Liver: No focal lesion identified. Within normal limits in parenchymal echogenicity. The intrahepatic bile ducts are dilated. Portal vein is patent on color Doppler imaging with normal direction of blood flow towards the liver. Other: None. IMPRESSION: New choledocholithiasis with intrahepatic biliary dilatation. Stones within the common bile duct measure up to 11.5 mm. Sequela of cholelithiasis without cholecystitis. Electronically Signed   By: Primitivo Gauze M.D.   On: 01/20/2020 12:02        Scheduled Meds: . indomethacin  100 mg Rectal Once  . nicotine  21 mg Transdermal Daily  . pantoprazole (PROTONIX) IV  40 mg Intravenous Q24H   Continuous Infusions: . dextrose 5 % and 0.9% NaCl 100 mL/hr at 01/21/20 0844  . piperacillin-tazobactam 3.375 g (01/21/20 0923)     LOS: 1 day    Time spent: 35 minutes    Sidney Ace, MD [facility] Pager [phone]  If 7PM-7AM, please contact night-coverage 01/21/2020, 1:41 PM

## 2020-01-21 NOTE — Progress Notes (Signed)
   01/21/20 0350  Vitals  Temp 97.8 F (36.6 C)  Temp Source Oral  BP (!) 89/53 (RN Zacharias Ridling notified)  MAP (mmHg) (!) 63 (RN Malia Corsi notified)  BP Location Right Arm  BP Method Automatic  Patient Position (if appropriate) Lying  Pulse Rate (!) 49 (RN Stassi Fadely notified)  Pulse Rate Source Monitor  Resp 15  MEWS COLOR  MEWS Score Color Yellow  Oxygen Therapy  SpO2 99 %  O2 Device Room Air  Pain Assessment  Pain Scale 0-10  Pain Score 0  MEWS Score  MEWS Temp 0  MEWS Systolic 1  MEWS Pulse 1  MEWS RR 0  MEWS LOC 0  MEWS Score 2  Provider Notification  Provider Name/Title Ouma, NP  Date Provider Notified 01/21/20  Time Provider Notified [phone]  Notification Type Page  Notification Reason Change in status  Response See new orders  Date of Provider Response 01/21/20  Time of Provider Response 0418   One liter bolus of LR given to help with stroke volume. MAP increased, BP still soft. Pt is asymptomatic, stabilized with interventions above.

## 2020-01-22 ENCOUNTER — Encounter: Payer: Self-pay | Admitting: Internal Medicine

## 2020-01-22 ENCOUNTER — Inpatient Hospital Stay: Payer: Medicaid Other | Admitting: Certified Registered"

## 2020-01-22 ENCOUNTER — Inpatient Hospital Stay: Payer: Medicaid Other

## 2020-01-22 ENCOUNTER — Encounter
Admission: EM | Disposition: A | Discharge status: Home / Self Care | Payer: Self-pay | Source: Home / Self Care | Attending: Internal Medicine

## 2020-01-22 DIAGNOSIS — K8051 Calculus of bile duct without cholangitis or cholecystitis with obstruction: Secondary | ICD-10-CM | POA: Diagnosis not present

## 2020-01-22 LAB — COMPREHENSIVE METABOLIC PANEL
ALT: 197 U/L — ABNORMAL HIGH (ref 0–44)
AST: 124 U/L — ABNORMAL HIGH (ref 15–41)
Albumin: 2.9 g/dL — ABNORMAL LOW (ref 3.5–5.0)
Alkaline Phosphatase: 297 U/L — ABNORMAL HIGH (ref 38–126)
Anion gap: 7 (ref 5–15)
BUN: 10 mg/dL (ref 6–20)
CO2: 25 mmol/L (ref 22–32)
Calcium: 8.7 mg/dL — ABNORMAL LOW (ref 8.9–10.3)
Chloride: 106 mmol/L (ref 98–111)
Creatinine, Ser: 0.96 mg/dL (ref 0.61–1.24)
GFR, Estimated: >60 mL/min (ref >60)
Glucose, Bld: 99 mg/dL (ref 70–99)
Potassium: 4.2 mmol/L (ref 3.5–5.1)
Sodium: 138 mmol/L (ref 135–145)
Total Bilirubin: 10.6 mg/dL — ABNORMAL HIGH (ref 0.3–1.2)
Total Protein: 6.4 g/dL — ABNORMAL LOW (ref 6.5–8.1)

## 2020-01-22 LAB — CBC WITH DIFFERENTIAL/PLATELET
Abs Immature Granulocytes: 0.06 10*3/uL (ref 0.00–0.07)
Basophils Absolute: 0 10*3/uL (ref 0.0–0.1)
Basophils Relative: 1 %
Eosinophils Absolute: 0.3 10*3/uL (ref 0.0–0.5)
Eosinophils Relative: 4 %
Hemoglobin: 10.3 g/dL — ABNORMAL LOW (ref 13.0–17.0)
Immature Granulocytes: 1 %
Lymphocytes Relative: 32 %
Lymphs Abs: 2.4 10*3/uL (ref 0.7–4.0)
Monocytes Absolute: 0.5 10*3/uL (ref 0.1–1.0)
Monocytes Relative: 6 %
Neutro Abs: 4.3 10*3/uL (ref 1.7–7.7)
Neutrophils Relative %: 56 %
Platelets: 145 10*3/uL — ABNORMAL LOW (ref 150–400)
Smear Review: NORMAL
WBC: 7.6 10*3/uL (ref 4.0–10.5)

## 2020-01-22 SURGERY — CHOLECYSTECTOMY, ROBOT-ASSISTED, LAPAROSCOPIC
Anesthesia: General

## 2020-01-22 MED ORDER — LIDOCAINE HCL (CARDIAC) PF 100 MG/5ML IV SOSY
PREFILLED_SYRINGE | INTRAVENOUS | Status: DC | PRN
  Administered 2020-01-22: 60 mg via INTRAVENOUS

## 2020-01-22 MED ORDER — ONDANSETRON HCL 4 MG/2ML IJ SOLN
INTRAMUSCULAR | Status: DC | PRN
  Administered 2020-01-22 (×2): 4 mg via INTRAVENOUS

## 2020-01-22 MED ORDER — DEXAMETHASONE SODIUM PHOSPHATE 10 MG/ML IJ SOLN
INTRAMUSCULAR | Status: DC | PRN
  Administered 2020-01-22: 10 mg via INTRAVENOUS

## 2020-01-22 MED ORDER — ONDANSETRON HCL 4 MG/2ML IJ SOLN
INTRAMUSCULAR | Status: AC
  Filled 2020-01-22: qty 2

## 2020-01-22 MED ORDER — OXYCODONE HCL 5 MG PO TABS
5.0000 mg | ORAL_TABLET | ORAL | Status: DC | PRN
  Administered 2020-01-23: 5 mg via ORAL
  Filled 2020-01-22: qty 1

## 2020-01-22 MED ORDER — PROPOFOL 10 MG/ML IV BOLUS
INTRAVENOUS | Status: AC
  Filled 2020-01-22: qty 20

## 2020-01-22 MED ORDER — BUPIVACAINE-EPINEPHRINE (PF) 0.25% -1:200000 IJ SOLN
INTRAMUSCULAR | Status: DC | PRN
  Administered 2020-01-22: 30 mL via PERINEURAL

## 2020-01-22 MED ORDER — DEXMEDETOMIDINE (PRECEDEX) IN NS 20 MCG/5ML (4 MCG/ML) IV SYRINGE
PREFILLED_SYRINGE | INTRAVENOUS | Status: DC | PRN
  Administered 2020-01-22: 8 ug via INTRAVENOUS

## 2020-01-22 MED ORDER — POLYETHYLENE GLYCOL 3350 17 G PO PACK: 17.0000 g | PACK | Freq: Every day | ORAL | Status: DC | PRN

## 2020-01-22 MED ORDER — GLYCOPYRROLATE 0.2 MG/ML IJ SOLN
INTRAMUSCULAR | Status: AC
  Filled 2020-01-22: qty 1

## 2020-01-22 MED ORDER — LACTATED RINGERS IV SOLN: INTRAVENOUS | Status: DC | PRN

## 2020-01-22 MED ORDER — PHENYLEPHRINE HCL (PRESSORS) 10 MG/ML IV SOLN
INTRAVENOUS | Status: DC | PRN
  Administered 2020-01-22 (×2): 100 ug via INTRAVENOUS
  Administered 2020-01-22: 200 ug via INTRAVENOUS
  Administered 2020-01-22: 100 ug via INTRAVENOUS
  Administered 2020-01-22: 200 ug via INTRAVENOUS
  Administered 2020-01-22 (×2): 100 ug via INTRAVENOUS

## 2020-01-22 MED ORDER — SUGAMMADEX SODIUM 200 MG/2ML IV SOLN
INTRAVENOUS | Status: DC | PRN
  Administered 2020-01-22: 300 mg via INTRAVENOUS

## 2020-01-22 MED ORDER — MIDAZOLAM HCL 2 MG/2ML IJ SOLN
INTRAMUSCULAR | Status: DC | PRN
  Administered 2020-01-22: 2 mg via INTRAVENOUS

## 2020-01-22 MED ORDER — LIDOCAINE HCL (PF) 2 % IJ SOLN
INTRAMUSCULAR | Status: AC
  Filled 2020-01-22: qty 5

## 2020-01-22 MED ORDER — KETOROLAC TROMETHAMINE 30 MG/ML IJ SOLN
30.0000 mg | Freq: Four times a day (QID) | INTRAMUSCULAR | Status: DC
  Administered 2020-01-22 – 2020-01-24 (×7): 30 mg via INTRAVENOUS
  Filled 2020-01-22 (×7): qty 1

## 2020-01-22 MED ORDER — FENTANYL CITRATE (PF) 100 MCG/2ML IJ SOLN: 25.0000 ug | INTRAMUSCULAR | Status: DC | PRN

## 2020-01-22 MED ORDER — PROPOFOL 10 MG/ML IV BOLUS
INTRAVENOUS | Status: DC | PRN
  Administered 2020-01-22: 120 mg via INTRAVENOUS

## 2020-01-22 MED ORDER — DEXMEDETOMIDINE (PRECEDEX) IN NS 20 MCG/5ML (4 MCG/ML) IV SYRINGE
PREFILLED_SYRINGE | INTRAVENOUS | Status: AC
  Filled 2020-01-22: qty 5

## 2020-01-22 MED ORDER — EPHEDRINE SULFATE 50 MG/ML IJ SOLN
INTRAMUSCULAR | Status: DC | PRN
  Administered 2020-01-22 (×2): 10 mg via INTRAVENOUS
  Administered 2020-01-22: 5 mg via INTRAVENOUS
  Administered 2020-01-22: 15 mg via INTRAVENOUS

## 2020-01-22 MED ORDER — FENTANYL CITRATE (PF) 100 MCG/2ML IJ SOLN
INTRAMUSCULAR | Status: DC | PRN
  Administered 2020-01-22 (×4): 50 ug via INTRAVENOUS

## 2020-01-22 MED ORDER — BUPIVACAINE-EPINEPHRINE (PF) 0.25% -1:200000 IJ SOLN
INTRAMUSCULAR | Status: AC
  Filled 2020-01-22: qty 30

## 2020-01-22 MED ORDER — PROMETHAZINE HCL 25 MG/ML IJ SOLN: 6.2500 mg | INTRAMUSCULAR | Status: DC | PRN

## 2020-01-22 MED ORDER — ROCURONIUM BROMIDE 100 MG/10ML IV SOLN
INTRAVENOUS | Status: DC | PRN
  Administered 2020-01-22: 50 mg via INTRAVENOUS
  Administered 2020-01-22: 20 mg via INTRAVENOUS

## 2020-01-22 MED ORDER — ROCURONIUM BROMIDE 10 MG/ML (PF) SYRINGE
PREFILLED_SYRINGE | INTRAVENOUS | Status: AC
  Filled 2020-01-22: qty 10

## 2020-01-22 MED ORDER — EPHEDRINE 5 MG/ML INJ
INTRAVENOUS | Status: AC
  Filled 2020-01-22: qty 10

## 2020-01-22 MED ORDER — GLYCOPYRROLATE 0.2 MG/ML IJ SOLN
INTRAMUSCULAR | Status: DC | PRN
  Administered 2020-01-22: .2 mg via INTRAVENOUS

## 2020-01-22 MED ORDER — DEXAMETHASONE SODIUM PHOSPHATE 10 MG/ML IJ SOLN
INTRAMUSCULAR | Status: AC
  Filled 2020-01-22: qty 1

## 2020-01-22 MED ORDER — MIDAZOLAM HCL 2 MG/2ML IJ SOLN
INTRAMUSCULAR | Status: AC
  Filled 2020-01-22: qty 2

## 2020-01-22 MED ORDER — FENTANYL CITRATE (PF) 250 MCG/5ML IJ SOLN
INTRAMUSCULAR | Status: AC
  Filled 2020-01-22: qty 5

## 2020-01-22 SURGICAL SUPPLY — 60 items
BAG INFUSER PRESSURE 100CC (MISCELLANEOUS) IMPLANT
CANISTER SUCT 1200ML W/VALVE (MISCELLANEOUS) ×3 IMPLANT
CANNULA REDUC XI 12-8 STAPL (CANNULA) ×1
CANNULA REDUC XI 12-8MM STAPL (CANNULA) ×1
CANNULA REDUCER 12-8 DVNC XI (CANNULA) ×1 IMPLANT
CHLORAPREP W/TINT 26 (MISCELLANEOUS) ×3 IMPLANT
CLIP VESOLOCK MED LG 6/CT (CLIP) ×3 IMPLANT
COVER WAND RF STERILE (DRAPES) ×3 IMPLANT
CUP MEDICINE 2OZ PLAST GRAD ST (MISCELLANEOUS) ×3 IMPLANT
DECANTER SPIKE VIAL GLASS SM (MISCELLANEOUS) ×3 IMPLANT
DEFOGGER SCOPE WARMER CLEARIFY (MISCELLANEOUS) ×3 IMPLANT
DERMABOND ADVANCED (GAUZE/BANDAGES/DRESSINGS) ×2
DERMABOND ADVANCED .7 DNX12 (GAUZE/BANDAGES/DRESSINGS) ×1 IMPLANT
DRAPE ARM DVNC X/XI (DISPOSABLE) ×4 IMPLANT
DRAPE COLUMN DVNC XI (DISPOSABLE) ×1 IMPLANT
DRAPE DA VINCI XI ARM (DISPOSABLE) ×8
DRAPE DA VINCI XI COLUMN (DISPOSABLE) ×2
DRSG TEGADERM 4X4.75 (GAUZE/BANDAGES/DRESSINGS) ×3 IMPLANT
ELECT CAUTERY BLADE TIP 2.5 (TIP) ×3
ELECT REM PT RETURN 9FT ADLT (ELECTROSURGICAL) ×3
ELECTRODE CAUTERY BLDE TIP 2.5 (TIP) ×1 IMPLANT
ELECTRODE REM PT RTRN 9FT ADLT (ELECTROSURGICAL) ×1 IMPLANT
GAUZE SPONGE 4X4 12PLY STRL (GAUZE/BANDAGES/DRESSINGS) ×3 IMPLANT
GLOVE SURG SYN 7.0 (GLOVE) ×6 IMPLANT
GLOVE SURG SYN 7.5  E (GLOVE) ×4
GLOVE SURG SYN 7.5 E (GLOVE) ×2 IMPLANT
GOWN STRL REUS W/ TWL LRG LVL3 (GOWN DISPOSABLE) ×4 IMPLANT
GOWN STRL REUS W/TWL LRG LVL3 (GOWN DISPOSABLE) ×8
IRRIGATOR SUCT 8 DISP DVNC XI (IRRIGATION / IRRIGATOR) ×1 IMPLANT
IRRIGATOR SUCTION 8MM XI DISP (IRRIGATION / IRRIGATOR) ×2
IV NS 1000ML (IV SOLUTION)
IV NS 1000ML BAXH (IV SOLUTION) IMPLANT
KIT PINK PAD W/HEAD ARE REST (MISCELLANEOUS) ×3
KIT PINK PAD W/HEAD ARM REST (MISCELLANEOUS) ×1 IMPLANT
LABEL OR SOLS (LABEL) ×3 IMPLANT
NEEDLE HYPO 22GX1.5 SAFETY (NEEDLE) ×3 IMPLANT
NS IRRIG 500ML POUR BTL (IV SOLUTION) ×3 IMPLANT
OBTURATOR OPTICAL STANDARD 8MM (TROCAR) ×2
OBTURATOR OPTICAL STND 8 DVNC (TROCAR) ×1
OBTURATOR OPTICALSTD 8 DVNC (TROCAR) ×1 IMPLANT
PACK LAP CHOLECYSTECTOMY (MISCELLANEOUS) ×3 IMPLANT
PENCIL ELECTRO HAND CTR (MISCELLANEOUS) ×3 IMPLANT
POUCH SPECIMEN RETRIEVAL 10MM (ENDOMECHANICALS) ×3 IMPLANT
RELOAD STAPLER 3.5X45 BLU DVNC (STAPLE) ×1 IMPLANT
SEAL CANN UNIV 5-8 DVNC XI (MISCELLANEOUS) ×4 IMPLANT
SEAL XI 5MM-8MM UNIVERSAL (MISCELLANEOUS) ×8
SET TUBE SMOKE EVAC HIGH FLOW (TUBING) ×3 IMPLANT
SOLUTION ELECTROLUBE (MISCELLANEOUS) ×3 IMPLANT
SPONGE LAP 18X18 RF (DISPOSABLE) ×3 IMPLANT
SPONGE LAP 4X18 RFD (DISPOSABLE) IMPLANT
STAPLER 45 DA VINCI SURE FORM (STAPLE) ×2
STAPLER 45 SUREFORM DVNC (STAPLE) ×1 IMPLANT
STAPLER CANNULA SEAL DVNC XI (STAPLE) ×1 IMPLANT
STAPLER CANNULA SEAL XI (STAPLE) ×2
STAPLER RELOAD 3.5X45 BLU DVNC (STAPLE) ×1
STAPLER RELOAD 3.5X45 BLUE (STAPLE) ×2
SUT MNCRL AB 4-0 PS2 18 (SUTURE) ×3 IMPLANT
SUT VICRYL 0 AB UR-6 (SUTURE) ×6 IMPLANT
TAPE TRANSPORE STRL 2 31045 (GAUZE/BANDAGES/DRESSINGS) ×3 IMPLANT
TROCAR BALLN GELPORT 12X130M (ENDOMECHANICALS) ×3 IMPLANT

## 2020-01-22 NOTE — Transfer of Care (Signed)
Immediate Anesthesia Transfer of Care Note  Patient: Manuel Horn  Procedure(s) Performed: XI ROBOTIC ASSISTED LAPAROSCOPIC CHOLECYSTECTOMY (N/A ) INDOCYANINE GREEN FLUORESCENCE IMAGING (ICG) (N/A )  Patient Location: PACU  Anesthesia Type:General  Level of Consciousness: awake, drowsy and patient cooperative  Airway & Oxygen Therapy: Patient Spontanous Breathing and Patient connected to face mask oxygen  Post-op Assessment: Report given to RN and Post -op Vital signs reviewed and stable  Post vital signs: Reviewed and stable  Last Vitals:  Vitals Value Taken Time  BP 117/75 01/22/20 1808  Temp 36.6 C 01/22/20 1808  Pulse 61 01/22/20 1814  Resp 15 01/22/20 1814  SpO2 100 % 01/22/20 1814  Vitals shown include unvalidated device data.  Last Pain:  Vitals:   01/22/20 1808  TempSrc:   PainSc: Asleep         Complications: No complications documented.

## 2020-01-22 NOTE — Anesthesia Procedure Notes (Signed)
Procedure Name: Intubation Performed by: , , CRNA Pre-anesthesia Checklist: Patient identified, Patient being monitored, Timeout performed, Emergency Drugs available and Suction available Patient Re-evaluated:Patient Re-evaluated prior to induction Oxygen Delivery Method: Circle system utilized Preoxygenation: Pre-oxygenation with 100% oxygen Induction Type: IV induction Ventilation: Mask ventilation without difficulty Laryngoscope Size: McGraph and 4 Grade View: Grade I Tube type: Oral Tube size: 7.5 mm Number of attempts: 1 Airway Equipment and Method: Stylet,  LTA kit utilized and Video-laryngoscopy Placement Confirmation: ETT inserted through vocal cords under direct vision,  positive ETCO2 and breath sounds checked- equal and bilateral Secured at: 23 cm Tube secured with: Tape Dental Injury: Teeth and Oropharynx as per pre-operative assessment        

## 2020-01-22 NOTE — Anesthesia Postprocedure Evaluation (Signed)
Anesthesia Post Note  Patient: Manuel Horn  Procedure(s) Performed: XI ROBOTIC ASSISTED LAPAROSCOPIC CHOLECYSTECTOMY (N/A ) INDOCYANINE GREEN FLUORESCENCE IMAGING (ICG) (N/A )  Patient location during evaluation: PACU Anesthesia Type: General Level of consciousness: awake and alert Pain management: pain level controlled Vital Signs Assessment: post-procedure vital signs reviewed and stable Respiratory status: spontaneous breathing and respiratory function stable Cardiovascular status: stable Anesthetic complications: no   No complications documented.   Last Vitals:  Vitals:   01/22/20 1344 01/22/20 1808  BP: 102/68 117/75  Pulse: (!) 51 71  Resp: 18 17  Temp: 36.7 C 36.6 C  SpO2: 100% 100%    Last Pain:  Vitals:   01/22/20 1808  TempSrc:   PainSc: Asleep                 Yolande Skoda K

## 2020-01-22 NOTE — Progress Notes (Signed)
01/22/2020  Subjective: No acute events.  LFTs improving today.  Patient denies any worsening pain and tolerated diet yesterday.  Plans for cholecystectomy today.  Vital signs: Temp:  [97.7 F (36.5 C)-98.5 F (36.9 C)] 97.7 F (36.5 C) (11/04 0811) Pulse Rate:  [48-60] 60 (11/04 0811) Resp:  [12-20] 20 (11/04 0811) BP: (84-108)/(51-67) 92/59 (11/04 0811) SpO2:  [96 %-100 %] 99 % (11/04 0811)   Intake/Output: 11/03 0701 - 11/04 0700 In: 1403.5 [P.O.:660; I.V.:743.5] Out: 725 [Drains:725] Last BM Date: 01/19/20  Physical Exam: Constitutional:  No acute distress Abdomen:  Soft, non-distended, non-tender.  Drain in place with bilious fluid.  Labs:  Recent Labs    01/21/20 0426 01/22/20 0537  WBC 7.3 7.6  HGB 10.1* 10.3*  HCT RESULTS UNAVAILABLE DUE TO INTERFERING SUBSTANCE RESULTS UNAVAILABLE DUE TO INTERFERING SUBSTANCE  PLT 123* 145*   Recent Labs    01/21/20 0426 01/22/20 0537  NA 134* 138  K UNABLE TO REPORT DUE TO ICTERUS 4.2  CL 101 106  CO2 26 25  GLUCOSE 88 99  BUN 10 10  CREATININE <0.30* 0.96  CALCIUM 8.7* 8.7*   Recent Labs    01/20/20 1856  LABPROT 12.9  INR 1.0    Imaging: IR PERCUTANEOUS TRANSHEPATIC CHOLANGIOGRAM  Result Date: 01/21/2020 INDICATION: 39 year old with jaundice and biliary obstruction. Biliary obstruction secondary to stones. ERCP was unable to decompress the biliary system. Plan for PTC and drain placement. EXAM: 1. PERCUTANEOUS TRANSHEPATIC CHOLANGIOGRAM USING ULTRASOUND AND FLUOROSCOPIC GUIDANCE 2. PLACEMENT OF INTERNAL/EXTERNAL BILIARY DRAIN MEDICATIONS: Zosyn 3.375 g; The antibiotic was administered within an appropriate time frame prior to the initiation of the procedure. ANESTHESIA/SEDATION: Moderate (conscious) sedation was employed during this procedure. A total of Versed 1.0 mg and Fentanyl 50 mcg was administered intravenously. Moderate Sedation Time: 22 minutes minutes. The patient's level of consciousness and vital signs  were monitored continuously by radiology nursing throughout the procedure under my direct supervision. FLUOROSCOPY TIME:  Fluoroscopy Time: 4 minutes 30 seconds (26.2 mGy). COMPLICATIONS: None immediate. PROCEDURE: Informed written consent was obtained from the patient after a thorough discussion of the procedural risks, benefits and alternatives. All questions were addressed. A timeout was performed prior to the initiation of the procedure. The anterior and right side of the abdomen was prepped and draped in sterile fashion. Maximal barrier sterile technique was utilized including caps, mask, sterile gowns, sterile gloves, sterile drape, hand hygiene and skin antiseptic. Ultrasound demonstrated markedly dilated bile ducts in the left hepatic lobe. Ultrasound image was saved for documentation. Skin was anesthetized using 1% lidocaine. Using ultrasound guidance, 21 gauge needle was directed into a dilated left hepatic bile duct. Wire was easily advanced into the biliary system and a transitional dilator set was placed. Cholangiogram images were obtained through the catheter. A Bentson wire was placed and a C2 catheter was advanced into the distal common bile duct and eventually into the duodenum using a Glidewire. A straight glide catheter was advanced into the distal duodenum over a Bentson wire. The tract was dilated to accommodate a 10 [place] biliary drain. Drain was advanced into the duodenum. Additional cholangiogram images were obtained. Large amount of clear yellowish bile was removed from the biliary system. Catheter was secured to skin with Prolene suture. The drain was flushed with saline and attached to a gravity bag. Fluoroscopic and ultrasound images were taken and saved for documentation. FINDINGS: Severe intrahepatic and extrahepatic biliary dilatation. Common bile duct is markedly dilated with a distal common bile duct  obstruction. Cystic duct is patent and evidence for cholelithiasis. Drain was  successfully advanced into the duodenum. Final image demonstrates stones within the common bile duct. IMPRESSION: 1. Severe intrahepatic and extrahepatic biliary dilatation due to obstructing stones. 2. Successful percutaneous transhepatic cholangiogram and drain placement. Drain extends into the duodenum. Electronically Signed   By: Markus Daft M.D.   On: 01/21/2020 10:53    Assessment/Plan: This is a 39 y.o. male with choledocholithiasis, s/p PTC drain placement.  --Will take to OR today for robotic cholecystectomy.  Discussed again with the patient.  He's ready for surgery.   Melvyn Neth, [facility]

## 2020-01-22 NOTE — Progress Notes (Signed)
Manuel Lame, MD [facility]   [address], [place],  [address] Phone: [phone] Fax : [phone]   Subjective: The patient doing well with a percutaneous drain of his biliary system in place.  The patient has no complaints today.  The patient has been seen by surgery and is planned for a cholecystectomy  Objective: Vital signs in last 24 hours: Vitals:   01/21/20 2333 01/22/20 0355 01/22/20 0811 01/22/20 1344  BP: 96/60 95/61 (!) 92/59 102/68  Pulse: (!) 54 (!) 53 60 (!) 51  Resp: _0 Temp: 98 F (36.7 C) 97.7 F (36.5 C) 97.7 F (36.5 C) 98.1 F (36.7 C)  TempSrc: Oral Oral Oral Oral  SpO2: 97% 100% 99% 100%  Weight:      Height:       Weight change: -0.047 kg  Intake/Output Summary (Last 24 hours) at 01/22/2020 1419 Last data filed at 01/22/2020 1205 Gross per 24 hour  Intake 1098.52 ml  Output 1075 ml  Net 23.52 ml     Exam: Heart:: Regular rate and rhythm, S1S2 present or without murmur or extra heart sounds Lungs: normal and clear to auscultation and percussion Abdomen: soft, nontender, normal bowel sounds percutaneous draining bile   Lab Results: _1 @ Micro Results: Recent Results (from the past 240 hour(s))  Respiratory Panel by RT PCR (Flu A&B, Covid) - Nasopharyngeal Swab     Status: None   Collection Time: 01/20/20 12:20 PM   Specimen: Nasopharyngeal Swab  Result Value Ref Range Status   SARS Coronavirus 2 by RT PCR NEGATIVE NEGATIVE Final    Comment: (NOTE) SARS-CoV-2 target nucleic acids are NOT DETECTED.  The SARS-CoV-2 RNA is generally detectable in upper respiratoy specimens during the acute phase of infection. The lowest concentration of SARS-CoV-2 viral copies this assay can detect is 131 copies/mL. A negative result does not preclude SARS-Cov-2 infection and should not be used as the sole basis for treatment or other patient management decisions. A negative result may occur with  improper specimen  collection/handling, submission of specimen other than nasopharyngeal swab, presence of viral mutation(s) within the areas targeted by this assay, and inadequate number of viral copies (<131 copies/mL). A negative result must be combined with clinical observations, patient history, and epidemiological information. The expected result is Negative.  Fact Sheet for Patients:  PinkCheek.be  Fact Sheet for Healthcare Providers:  GravelBags.it  This test is no t yet approved or cleared by the [place] FDA and  has been authorized for detection and/or diagnosis of SARS-CoV-2 by FDA under an Emergency Use Authorization (EUA). This EUA will remain  in effect (meaning this test can be used) for the duration of the COVID-19 declaration under Section 564(b)(1) of the Act, 21 U.S.C. section 360bbb-3(b)(1), unless the authorization is terminated or revoked sooner.     Influenza A by PCR NEGATIVE NEGATIVE Final   Influenza B by PCR NEGATIVE NEGATIVE Final    Comment: (NOTE) The Xpert Xpress SARS-CoV-2/FLU/RSV assay is intended as an aid in  the diagnosis of influenza from Nasopharyngeal swab specimens and  should not be used as a sole basis for treatment. Nasal washings and  aspirates are unacceptable for Xpert Xpress SARS-CoV-2/FLU/RSV  testing.  Fact Sheet for Patients: PinkCheek.be  Fact Sheet for Healthcare Providers: GravelBags.it  This test is not yet approved or cleared by the [place] FDA and  has been authorized for detection and/or diagnosis of SARS-CoV-2 by  FDA under an Emergency Use Authorization (  EUA). This EUA will remain  in effect (meaning this test can be used) for the duration of the  Covid-19 declaration under Section 564(b)(1) of the Act, 21  U.S.C. section 360bbb-3(b)(1), unless the authorization is  terminated or revoked. Performed at [facility], [address], [place], [place] [address]    Studies/Results: DG C-Arm 1-60 Min-No Report  Result Date: 01/20/2020 Fluoroscopy was utilized by the requesting physician.  No radiographic interpretation.   IR PERCUTANEOUS TRANSHEPATIC CHOLANGIOGRAM  Result Date: 01/21/2020 INDICATION: 39 year old with jaundice and biliary obstruction. Biliary obstruction secondary to stones. ERCP was unable to decompress the biliary system. Plan for PTC and drain placement. EXAM: 1. PERCUTANEOUS TRANSHEPATIC CHOLANGIOGRAM USING ULTRASOUND AND FLUOROSCOPIC GUIDANCE 2. PLACEMENT OF INTERNAL/EXTERNAL BILIARY DRAIN MEDICATIONS: Zosyn 3.375 g; The antibiotic was administered within an appropriate time frame prior to the initiation of the procedure. ANESTHESIA/SEDATION: Moderate (conscious) sedation was employed during this procedure. A total of Versed 1.0 mg and Fentanyl 50 mcg was administered intravenously. Moderate Sedation Time: 22 minutes minutes. The patient's level of consciousness and vital signs were monitored continuously by radiology nursing throughout the procedure under my direct supervision. FLUOROSCOPY TIME:  Fluoroscopy Time: 4 minutes 30 seconds (26.2 mGy). COMPLICATIONS: None immediate. PROCEDURE: Informed written consent was obtained from the patient after a thorough discussion of the procedural risks, benefits and alternatives. All questions were addressed. A timeout was performed prior to the initiation of the procedure. The anterior and right side of the abdomen was prepped and draped in sterile fashion. Maximal barrier sterile technique was utilized including caps, mask, sterile gowns, sterile gloves, sterile drape, hand hygiene and skin antiseptic. Ultrasound demonstrated markedly dilated bile ducts in the left hepatic lobe. Ultrasound image was saved for documentation. Skin was anesthetized using 1% lidocaine. Using ultrasound guidance, 21 gauge needle was directed into a dilated left  hepatic bile duct. Wire was easily advanced into the biliary system and a transitional dilator set was placed. Cholangiogram images were obtained through the catheter. A Bentson wire was placed and a C2 catheter was advanced into the distal common bile duct and eventually into the duodenum using a Glidewire. A straight glide catheter was advanced into the distal duodenum over a Bentson wire. The tract was dilated to accommodate a 10 [place] biliary drain. Drain was advanced into the duodenum. Additional cholangiogram images were obtained. Large amount of clear yellowish bile was removed from the biliary system. Catheter was secured to skin with Prolene suture. The drain was flushed with saline and attached to a gravity bag. Fluoroscopic and ultrasound images were taken and saved for documentation. FINDINGS: Severe intrahepatic and extrahepatic biliary dilatation. Common bile duct is markedly dilated with a distal common bile duct obstruction. Cystic duct is patent and evidence for cholelithiasis. Drain was successfully advanced into the duodenum. Final image demonstrates stones within the common bile duct. IMPRESSION: 1. Severe intrahepatic and extrahepatic biliary dilatation due to obstructing stones. 2. Successful percutaneous transhepatic cholangiogram and drain placement. Drain extends into the duodenum. Electronically Signed   By: Markus Daft M.D.   On: 01/21/2020 10:53   Medications: I have reviewed the patient's current medications. Scheduled Meds: . [MAR Hold] indomethacin  100 mg Rectal Once  . [MAR Hold] nicotine  21 mg Transdermal Daily  . [MAR Hold] pantoprazole (PROTONIX) IV  40 mg Intravenous Q24H   Continuous Infusions: . dextrose 5 % and 0.9% NaCl 100 mL/hr at 01/22/20 1200  . [MAR Hold] piperacillin-tazobactam (ZOSYN)  IV 3.375 g (01/22/20  1335)   PRN Meds:.[MAR Hold]  morphine injection, [MAR Hold] promethazine   Assessment: Active Problems:   Choledocholithiasis with obstruction    Nicotine dependence   Schizoaffective disorder ([place])   Jaundice    Plan: The patient has been given my business card and told to follow-up with me as an outpatient for an ERCP for the stone extraction and removal of the percutaneous drain.  This should happen in the next 1 to 2 weeks.  The patient should be set up for follow-up when discharged with me and he has been informed of this plan.  The patient has agreed to it.   LOS: 2 days   Lewayne Bunting 01/22/2020, 2:19 PM Pager [phone] 7am-5pm  Check AMION for 5pm -7am coverage and on weekends

## 2020-01-22 NOTE — Progress Notes (Signed)
Patient returned from surgery. Lap sites x4 with dermabond. Bili drain remains intact with dressing clean and dry. Patient denies any c/o pain or discomfort.

## 2020-01-22 NOTE — Anesthesia Preprocedure Evaluation (Signed)
Anesthesia Evaluation  Patient identified by MRN, date of birth, ID band Patient awake    Reviewed: Allergy & Precautions, NPO status , Patient's Chart, lab work & pertinent test results  History of Anesthesia Complications Negative for: history of anesthetic complications  Airway Mallampati: II       Dental  (+) Dental Advidsory Given, Poor Dentition   Pulmonary neg sleep apnea, neg COPD, Current Smoker and Patient abstained from smoking.,           Cardiovascular (-) hypertension(-) Past MI and (-) CHF (-) dysrhythmias (-) Valvular Problems/Murmurs     Neuro/Psych neg Seizures PSYCHIATRIC DISORDERS Schizophrenia    GI/Hepatic Neg liver ROS, neg GERD  ,  Endo/Other  neg diabetes  Renal/GU negative Renal ROS     Musculoskeletal   Abdominal   Peds  Hematology   Anesthesia Other Findings Past Medical History: No date: Schizoaffective disorder ([facility])   Reproductive/Obstetrics                             Anesthesia Physical  Anesthesia Plan  ASA: II  Anesthesia Plan: General   Post-op Pain Management:    Induction: Intravenous  PONV Risk Score and Plan: 1 and Ondansetron, Dexamethasone, Midazolam, Promethazine and Treatment may vary due to age or medical condition  Airway Management Planned: Oral ETT  Additional Equipment:   Intra-op Plan:   Post-operative Plan: Extubation in OR  Informed Consent: I have reviewed the patients History and Physical, chart, labs and discussed the procedure including the risks, benefits and alternatives for the proposed anesthesia with the patient or authorized representative who has indicated his/her understanding and acceptance.       Plan Discussed with:   Anesthesia Plan Comments:         Anesthesia Quick Evaluation

## 2020-01-22 NOTE — Op Note (Signed)
Procedure Date:  01/22/2020  Pre-operative Diagnosis:  Choledocholithiasis  Post-operative Diagnosis:  Choledocholithiasis  Procedure:  Robotic assisted cholecystectomy with ICG FireFly cholangiogram  Surgeon:  Melvyn Neth, MD  Assistant:  April Greissinger, PA-S  Anesthesia:  General endotracheal  Estimated Blood Loss:  10 ml  Specimens:  gallbladder  Complications:  None  Indications for Procedure:  This is a 39 y.o. male who presents with abdominal pain and workup revealing choledocholithiasis.  He's s/p PT drain placement with IR yesterday and is ready for surgery today.  The benefits, complications, treatment options, and expected outcomes were discussed with the patient. The risks of bleeding, infection, recurrence of symptoms, failure to resolve symptoms, bile duct damage, bile duct leak, retained common bile duct stone, bowel injury, and need for further procedures were all discussed with the patient and he was willing to proceed.  Description of Procedure: The patient was correctly identified in the preoperative area and brought into the operating room.  The patient was placed supine with VTE prophylaxis in place.  Appropriate time-outs were performed.  Anesthesia was induced and the patient was intubated.  Appropriate antibiotics were infused.  The abdomen was prepped and draped in a sterile fashion. An infraumbilical incision was made. A cutdown technique was used to enter the abdominal cavity without injury, and a 12-mm robotic trocar was inserted.  Pneumoperitoneum was obtained with appropriate opening pressures.  Three 8-mm ports were placed at the level of the umbilicus along the right and left sides under direct visualization.  The DaVinci platform was docked, camera targeted, and instruments placed under direct visualization.    The gallbladder was identified.  The fundus was grasped and retracted cephalad.  Adhesions were lysed bluntly and with electrocautery. The  infundibulum was grasped and retracted laterally, exposing the peritoneum overlying the gallbladder.  This was incised with electrocautery and extended on either side of the gallbladder.  The area of the cystic duct seemed to have a lot of inflammatory changes and FireFly cholangiogram was attempted.  However, it appears the ICG did not infiltrate into the cystic duct or gallbladder, likely because of the PTC drain he had in place.  The common bile duct was faint but visible.  The cystic artery was carefully dissected without complications.  The cystic duct area was dissected, and two ductotomies were created during the dissection.  The duct was very wide.  In light of that, I decided to staple across the cystic duct, below the two ductotomies.  I consulted with Dr. Lysle Pearl to confirm that I was stapling at the cystic duct and not the common bile duct.  A blue load stapler was fired across without complications.  The cystic artery was clipped twice proximally and once distally, and cut in between.  The gallbladder was taken from the gallbladder fossa in a retrograde fashion with electrocautery. The gallbladder was placed in an Endocatch bag. The liver bed was inspected and any bleeding was controlled with electrocautery. The right upper quadrant was then inspected again revealing intact clips and staple line, no bleeding, and no ductal injury.  The area was thoroughly irrigated.  The 8 mm ports were removed under direct visualization and the 12 mm trocar was removed.  The Endocatch bag was brought out via the umbilical incision. The fascial opening was closed using 0 vicryl suture.  Local anesthetic was infused in all incisions and the incisions were closed with 4-0 Monocryl.  The wounds were cleaned and sealed with DermaBond.  The gallbladder  staple line was opened outside of the surgical field and this revealed an opening directly going into the gallbladder, without any drain material across, confirming the  stapling of the true cystic duct.  The patient was emerged from anesthesia and extubated and brought to the recovery room for further management.  The patient tolerated the procedure well and all counts were correct at the end of the case.   Melvyn Neth, MD

## 2020-01-22 NOTE — Progress Notes (Signed)
PROGRESS NOTE    Manuel Horn  BLT:[id number] DOB: 14-Jun-1980 DOA: 01/20/2020 PCP: Lavera Guise, MD  Brief Narrative: 39 year old male presented to the emergency department for evaluation of scleral icterus 5 days duration also complains of nausea, vomiting, abdominal pain.  Patient has a history of cholelithiasis but declined previous cholecystectomy.  Patient was found to have evidence of choledocholithiasis without evidence of acute cholangitis.  Underwent ERCP on 01/20/2020.  Procedure was prematurely aborted due to procedural difficulty.  Patient was evaluated by interventional radiology who recommended proceeding with PTC with drain placement  Patient underwent this procedure successfully on 01/21/2020.  Plan to maintain drain for period of 1 to 2 weeks and repeat transhepatic approach to ERCP later date.  General surgery on consult for cholecystectomy.  11/4: Bilirubin trending down.  Jaundice appearing to improve.  Plan of care discussed with general surgery consult Dr. Hampton Abbot.  Plan to proceed with laparoscopic cholecystectomy today during this admission.  Patient aware of surgical plan.   Assessment & Plan:   Active Problems:   Choledocholithiasis with obstruction   Nicotine dependence   Schizoaffective disorder ([place])   Jaundice  Acute choledocholithiasis Obstructive jaundice Known history of cholelithiasis 5 days of scleral icterus Status post ERCP attempt 01/20/20 Status post PTC with drain placement 01/21/2020 Seen by general surgery, patient agreeable to cholecystectomy during this admission Biliary drain to stay in place until reevaluation by GI and transhepatic ERCP Plan: N.p.o. pending OR Continue IV fluids Continue Zosyn Pain control as needed  Nicotine dependence Encourage smoking cessation NicoDerm patch  Schizoaffective disorder Appears stable.  No medications reflected on home MAR   DVT prophylaxis: SCD Code Status: Full Family Communication:  Mother at bedside 01/21/2020 Disposition Plan: Status is: Inpatient  Remains inpatient appropriate because:Inpatient level of care appropriate due to severity of illness   Dispo: The patient is from: Home              Anticipated d/c is to: Home              Anticipated d/c date is: 2 days              Patient currently is not medically stable to d/c.  Patient scheduled for OR for laparoscopic cholecystectomy today.  If patient remained stable and bilirubin continues to downtrend anticipate discharge within 48 hours.       Consultants:   GI  IR  General surgery  Procedures:  ERCP, 01/20/20  PTC with biliary drain, 01/21/2020  Antimicrobials:   Zosyn   Subjective: Patient seen and examined.  No complaints this morning.  Right upper quadrant pain improved.  Nausea improved.  N.p.o. for OR today.  Objective: Vitals:   01/21/20 2041 01/21/20 2333 01/22/20 0355 01/22/20 0811  BP: (!) '95/51 96/60 95/61 ' (!) 92/59  Pulse:  (!) 54 (!) 53 60  Resp:  '16 16 20  ' Temp:  98 F (36.7 C) 97.7 F (36.5 C) 97.7 F (36.5 C)  TempSrc:  Oral Oral Oral  SpO2:  97% 100% 99%  Weight:      Height:        Intake/Output Summary (Last 24 hours) at 01/22/2020 1125 Last data filed at 01/22/2020 0536 Gross per 24 hour  Intake 1098.52 ml  Output 725 ml  Net 373.52 ml   Filed Weights   01/20/20 1538 01/20/20 1848 01/21/20 0900  Weight: 68.9 kg 68.9 kg 68.9 kg    Examination:  General exam: Appears calm and comfortable  Respiratory system: Clear to auscultation. Respiratory effort normal. Cardiovascular system: S1 & S2 heard, RRR. No JVD, murmurs, rubs, gallops or clicks. No pedal edema. Gastrointestinal system: Nondistended, with a drain in place, mild tenderness around drain insertion site. Central nervous system: Alert and oriented. No focal neurological deficits. Extremities: Symmetric 5 x 5 power. Skin: Scleral icterus bilaterally Psychiatry: Judgement and insight appear  normal. Mood & affect appropriate.     Data Reviewed: I have personally reviewed following labs and imaging studies  CBC: Recent Labs  Lab 01/20/20 1011 01/21/20 0426 01/22/20 0537  WBC 9.9 7.3 7.6  NEUTROABS  --   --  4.3  HGB 12.3* 10.1* 10.3*  HCT RESULTS UNAVAILABLE DUE TO INTERFERING SUBSTANCE RESULTS UNAVAILABLE DUE TO INTERFERING SUBSTANCE RESULTS UNAVAILABLE DUE TO INTERFERING SUBSTANCE  MCV RESULTS UNAVAILABLE DUE TO INTERFERING SUBSTANCE RESULTS UNAVAILABLE DUE TO INTERFERING SUBSTANCE RESULTS UNAVAILABLE DUE TO INTERFERING SUBSTANCE  PLT 168 123* 456*   Basic Metabolic Panel: Recent Labs  Lab 01/20/20 1011 01/21/20 0426 01/22/20 0537  NA 131* 134* 138  K 4.5 UNABLE TO REPORT DUE TO ICTERUS 4.2  CL 96* 101 106  CO2 '26 26 25  ' GLUCOSE 195* 88 99  BUN '8 10 10  ' CREATININE UNABLE TO REPORT DUE TO ICTERUS <0.30* 0.96  CALCIUM 9.5 8.7* 8.7*   GFR: Estimated Creatinine Clearance: 100.7 mL/min (by C-G formula based on SCr of 0.96 mg/dL). Liver Function Tests: Recent Labs  Lab 01/20/20 1011 01/21/20 0426 01/22/20 0537  AST 175* 159* 124*  ALT 262* 212* 197*  ALKPHOS 379* 290* 297*  BILITOT 24.0* 20.5* 10.6*  PROT 7.6 5.7* 6.4*  ALBUMIN 3.4* 2.8* 2.9*   Recent Labs  Lab 01/20/20 1011  LIPASE 34   No results for input(s): AMMONIA in the last 168 hours. Coagulation Profile: Recent Labs  Lab 01/20/20 1856  INR 1.0   Cardiac Enzymes: No results for input(s): CKTOTAL, CKMB, CKMBINDEX, TROPONINI in the last 168 hours. BNP (last 3 results) No results for input(s): PROBNP in the last 8760 hours. HbA1C: No results for input(s): HGBA1C in the last 72 hours. CBG: No results for input(s): GLUCAP in the last 168 hours. Lipid Profile: No results for input(s): CHOL, HDL, LDLCALC, TRIG, CHOLHDL, LDLDIRECT in the last 72 hours. Thyroid Function Tests: No results for input(s): TSH, T4TOTAL, FREET4, T3FREE, THYROIDAB in the last 72 hours. Anemia Panel: No results  for input(s): VITAMINB12, FOLATE, FERRITIN, TIBC, IRON, RETICCTPCT in the last 72 hours. Sepsis Labs: No results for input(s): PROCALCITON, LATICACIDVEN in the last 168 hours.  Recent Results (from the past 240 hour(s))  Respiratory Panel by RT PCR (Flu A&B, Covid) - Nasopharyngeal Swab     Status: None   Collection Time: 01/20/20 12:20 PM   Specimen: Nasopharyngeal Swab  Result Value Ref Range Status   SARS Coronavirus 2 by RT PCR NEGATIVE NEGATIVE Final    Comment: (NOTE) SARS-CoV-2 target nucleic acids are NOT DETECTED.  The SARS-CoV-2 RNA is generally detectable in upper respiratoy specimens during the acute phase of infection. The lowest concentration of SARS-CoV-2 viral copies this assay can detect is 131 copies/mL. A negative result does not preclude SARS-Cov-2 infection and should not be used as the sole basis for treatment or other patient management decisions. A negative result may occur with  improper specimen collection/handling, submission of specimen other than nasopharyngeal swab, presence of viral mutation(s) within the areas targeted by this assay, and inadequate number of viral copies (<131 copies/mL). A negative result must  be combined with clinical observations, patient history, and epidemiological information. The expected result is Negative.  Fact Sheet for Patients:  PinkCheek.be  Fact Sheet for Healthcare Providers:  GravelBags.it  This test is no t yet approved or cleared by the [place] FDA and  has been authorized for detection and/or diagnosis of SARS-CoV-2 by FDA under an Emergency Use Authorization (EUA). This EUA will remain  in effect (meaning this test can be used) for the duration of the COVID-19 declaration under Section 564(b)(1) of the Act, 21 U.S.C. section 360bbb-3(b)(1), unless the authorization is terminated or revoked sooner.     Influenza A by PCR NEGATIVE NEGATIVE Final     Influenza B by PCR NEGATIVE NEGATIVE Final    Comment: (NOTE) The Xpert Xpress SARS-CoV-2/FLU/RSV assay is intended as an aid in  the diagnosis of influenza from Nasopharyngeal swab specimens and  should not be used as a sole basis for treatment. Nasal washings and  aspirates are unacceptable for Xpert Xpress SARS-CoV-2/FLU/RSV  testing.  Fact Sheet for Patients: PinkCheek.be  Fact Sheet for Healthcare Providers: GravelBags.it  This test is not yet approved or cleared by the [place] FDA and  has been authorized for detection and/or diagnosis of SARS-CoV-2 by  FDA under an Emergency Use Authorization (EUA). This EUA will remain  in effect (meaning this test can be used) for the duration of the  Covid-19 declaration under Section 564(b)(1) of the Act, 21  U.S.C. section 360bbb-3(b)(1), unless the authorization is  terminated or revoked. Performed at [facility], [address] [place], [place] [address]          Radiology Studies: DG C-Arm 1-60 Min-No Report  Result Date: 01/20/2020 Fluoroscopy was utilized by the requesting physician.  No radiographic interpretation.   IR PERCUTANEOUS TRANSHEPATIC CHOLANGIOGRAM  Result Date: 01/21/2020 INDICATION: 39 year old with jaundice and biliary obstruction. Biliary obstruction secondary to stones. ERCP was unable to decompress the biliary system. Plan for PTC and drain placement. EXAM: 1. PERCUTANEOUS TRANSHEPATIC CHOLANGIOGRAM USING ULTRASOUND AND FLUOROSCOPIC GUIDANCE 2. PLACEMENT OF INTERNAL/EXTERNAL BILIARY DRAIN MEDICATIONS: Zosyn 3.375 g; The antibiotic was administered within an appropriate time frame prior to the initiation of the procedure. ANESTHESIA/SEDATION: Moderate (conscious) sedation was employed during this procedure. A total of Versed 1.0 mg and Fentanyl 50 mcg was administered intravenously. Moderate Sedation Time: 22 minutes minutes. The  patient's level of consciousness and vital signs were monitored continuously by radiology nursing throughout the procedure under my direct supervision. FLUOROSCOPY TIME:  Fluoroscopy Time: 4 minutes 30 seconds (26.2 mGy). COMPLICATIONS: None immediate. PROCEDURE: Informed written consent was obtained from the patient after a thorough discussion of the procedural risks, benefits and alternatives. All questions were addressed. A timeout was performed prior to the initiation of the procedure. The anterior and right side of the abdomen was prepped and draped in sterile fashion. Maximal barrier sterile technique was utilized including caps, mask, sterile gowns, sterile gloves, sterile drape, hand hygiene and skin antiseptic. Ultrasound demonstrated markedly dilated bile ducts in the left hepatic lobe. Ultrasound image was saved for documentation. Skin was anesthetized using 1% lidocaine. Using ultrasound guidance, 21 gauge needle was directed into a dilated left hepatic bile duct. Wire was easily advanced into the biliary system and a transitional dilator set was placed. Cholangiogram images were obtained through the catheter. A Bentson wire was placed and a C2 catheter was advanced into the distal common bile duct and eventually into the duodenum using a Glidewire. A straight glide catheter was  advanced into the distal duodenum over a Bentson wire. The tract was dilated to accommodate a 10 [place] biliary drain. Drain was advanced into the duodenum. Additional cholangiogram images were obtained. Large amount of clear yellowish bile was removed from the biliary system. Catheter was secured to skin with Prolene suture. The drain was flushed with saline and attached to a gravity bag. Fluoroscopic and ultrasound images were taken and saved for documentation. FINDINGS: Severe intrahepatic and extrahepatic biliary dilatation. Common bile duct is markedly dilated with a distal common bile duct obstruction. Cystic duct is patent  and evidence for cholelithiasis. Drain was successfully advanced into the duodenum. Final image demonstrates stones within the common bile duct. IMPRESSION: 1. Severe intrahepatic and extrahepatic biliary dilatation due to obstructing stones. 2. Successful percutaneous transhepatic cholangiogram and drain placement. Drain extends into the duodenum. Electronically Signed   By: Markus Daft M.D.   On: 01/21/2020 10:53   US ABDOMEN LIMITED RUQ (LIVER/GB)  Result Date: 01/20/2020 CLINICAL DATA:  Jaundice EXAM: ULTRASOUND ABDOMEN LIMITED RIGHT UPPER QUADRANT COMPARISON:  11/28/2018 right upper quadrant ultrasound and CT renal stone. FINDINGS: Gallbladder: Multiple mobile intraluminal gallstones measuring up to 8.7 mm with biliary sludge. No gallbladder wall thickening. No pericholecystic free fluid. Sonographic Murphy sign was negative. Common bile duct: Diameter: Newly dilated measuring 15.9 mm with intraluminal calculi measuring up to 11.5 mm. Liver: No focal lesion identified. Within normal limits in parenchymal echogenicity. The intrahepatic bile ducts are dilated. Portal vein is patent on color Doppler imaging with normal direction of blood flow towards the liver. Other: None. IMPRESSION: New choledocholithiasis with intrahepatic biliary dilatation. Stones within the common bile duct measure up to 11.5 mm. Sequela of cholelithiasis without cholecystitis. Electronically Signed   By: Primitivo Gauze M.D.   On: 01/20/2020 12:02        Scheduled Meds: . indocyanine green  2.5 mg Intravenous 60 min Pre-Op  . indomethacin  100 mg Rectal Once  . nicotine  21 mg Transdermal Daily  . pantoprazole (PROTONIX) IV  40 mg Intravenous Q24H   Continuous Infusions: . dextrose 5 % and 0.9% NaCl 100 mL/hr at 01/21/20 1900  . piperacillin-tazobactam (ZOSYN)  IV 3.375 g (01/22/20 0536)     LOS: 2 days    Time spent: 25 minutes    Sidney Ace, MD [facility] Pager [phone]  If 7PM-7AM,  please contact night-coverage 01/22/2020, 11:25 AM

## 2020-01-23 LAB — COMPREHENSIVE METABOLIC PANEL
ALT: 166 U/L — ABNORMAL HIGH (ref 0–44)
AST: 93 U/L — ABNORMAL HIGH (ref 15–41)
Albumin: 3 g/dL — ABNORMAL LOW (ref 3.5–5.0)
Alkaline Phosphatase: 255 U/L — ABNORMAL HIGH (ref 38–126)
Anion gap: 9 (ref 5–15)
BUN: 10 mg/dL (ref 6–20)
CO2: 22 mmol/L (ref 22–32)
Calcium: 9.2 mg/dL (ref 8.9–10.3)
Chloride: 101 mmol/L (ref 98–111)
Creatinine, Ser: 1.01 mg/dL (ref 0.61–1.24)
GFR, Estimated: >60 mL/min (ref >60)
Glucose, Bld: 177 mg/dL — ABNORMAL HIGH (ref 70–99)
Potassium: 4.4 mmol/L (ref 3.5–5.1)
Sodium: 132 mmol/L — ABNORMAL LOW (ref 135–145)
Total Bilirubin: 10 mg/dL — ABNORMAL HIGH (ref 0.3–1.2)
Total Protein: 6.8 g/dL (ref 6.5–8.1)

## 2020-01-23 LAB — CBC WITH DIFFERENTIAL/PLATELET
Abs Immature Granulocytes: 0.08 10*3/uL — ABNORMAL HIGH (ref 0.00–0.07)
Basophils Absolute: 0 10*3/uL (ref 0.0–0.1)
Basophils Relative: 0 %
Eosinophils Absolute: 0 10*3/uL (ref 0.0–0.5)
Eosinophils Relative: 0 %
HCT: 26.4 % — ABNORMAL LOW (ref 39.0–52.0)
Hemoglobin: 9.9 g/dL — ABNORMAL LOW (ref 13.0–17.0)
Immature Granulocytes: 1 %
Lymphocytes Relative: 15 %
Lymphs Abs: 1.7 10*3/uL (ref 0.7–4.0)
MCH: 33.4 pg (ref 26.0–34.0)
MCHC: 37.5 g/dL — ABNORMAL HIGH (ref 30.0–36.0)
MCV: 89.2 fL (ref 80.0–100.0)
Monocytes Absolute: 0.4 10*3/uL (ref 0.1–1.0)
Monocytes Relative: 4 %
Neutro Abs: 9.2 10*3/uL — ABNORMAL HIGH (ref 1.7–7.7)
Neutrophils Relative %: 80 %
Platelets: 154 10*3/uL (ref 150–400)
RBC: 2.96 MIL/uL — ABNORMAL LOW (ref 4.22–5.81)
RDW: 19 % — ABNORMAL HIGH (ref 11.5–15.5)
WBC: 11.3 10*3/uL — ABNORMAL HIGH (ref 4.0–10.5)
nRBC: 0 % (ref 0.0–0.2)

## 2020-01-23 NOTE — Progress Notes (Signed)
[place] [facility] Day(s): 3.   Post op day(s): 1 Day Post-Op.   Interval History:  Patient seen and examined no acute events or new complaints overnight.  Patient reports he is overall doing well, soreness No nausea, emesis Mild leukocytosis to 11.3K this morning; no fever, likely reactive from surgery Hgb stable at 9.9; no signs of bleeding Renal function remains normal with sCr - 1.01, UO - 2.0L + unmeasured No significant electrolyte derangements this morning aside from very mild hyponatremia to 132 Hyperbilirubinemia continues to improve; down to 10.0 (was 24 on admission) Internal-External drain with 575 ccs out in last 24 hours; bilious Remains on Zosyn  Advanced to regular diet GI will follow outpatient for ERCP in 1-2 weeks  Vital signs in last 24 hours: [min-max] current  Temp:  [97 F (36.1 C)-98.4 F (36.9 C)] 97.7 F (36.5 C) (11/05 0422) Pulse Rate:  [51-71] 52 (11/05 0422) Resp:  [15-20] 20 (11/05 0422) BP: (92-124)/(57-75) 100/57 (11/05 0422) SpO2:  [98 %-100 %] 99 % (11/05 0422)     Height: 6' (182.9 cm) Weight: 68.9 kg BMI (Calculated): 20.6   Intake/Output last 2 shifts:  11/04 0701 - 11/05 0700 In: 3047 [P.O.:440; I.V.:2505.5; IV Piggyback:101.5] Out: 2650 [Urine:2050; Drains:575; Blood:25]   Physical Exam:  Constitutional: alert, cooperative and no distress  Respiratory: breathing non-labored at rest  Cardiovascular: regular rate and sinus rhythm  Gastrointestinal: Soft, incisional soreness, and non-distended. No rebound/guarding. Biliary drain in place Integumentary: Laparoscopic incisions are CDI with dermabond, no erythema or drainage   Labs:  CBC Latest Ref Rng & Units 01/23/2020 01/22/2020 01/21/2020  WBC 4.0 - 10.5 K/uL 11.3(H) 7.6 7.3  Hemoglobin 13.0 - 17.0 g/dL 9.9(L) 10.3(L) 10.1(L)  Hematocrit 39 - 52 % 26.4(L) RESULTS UNAVAILABLE DUE TO INTERFERING SUBSTANCE RESULTS UNAVAILABLE DUE TO INTERFERING  SUBSTANCE  Platelets 150 - 400 K/uL 154 145(L) 123(L)   CMP Latest Ref Rng & Units 01/23/2020 01/22/2020 01/21/2020  Glucose 70 - 99 mg/dL 177(H) 99 88  BUN 6 - 20 mg/dL '10 10 10  ' Creatinine 0.61 - 1.24 mg/dL 1.01 0.96 <0.30(L)  Sodium 135 - 145 mmol/L 132(L) 138 134(L)  Potassium 3.5 - 5.1 mmol/L 4.4 4.2 UNABLE TO REPORT DUE TO ICTERUS  Chloride 98 - 111 mmol/L 101 106 101  CO2 22 - 32 mmol/L '22 25 26  ' Calcium 8.9 - 10.3 mg/dL 9.2 8.7(L) 8.7(L)  Total Protein 6.5 - 8.1 g/dL 6.8 6.4(L) 5.7(L)  Total Bilirubin 0.3 - 1.2 mg/dL 10.0(H) 10.6(H) 20.5(HH)  Alkaline Phos 38 - 126 U/L 255(H) 297(H) 290(H)  AST 15 - 41 U/L 93(H) 124(H) 159(H)  ALT 0 - 44 U/L 166(H) 197(H) 212(H)    Imaging studies: No new pertinent imaging studies   Assessment/Plan:  39 y.o. male 1 Day Post-Op s/p robotic assisted laparoscopic cholecystectomy for choledocholithiasis s/p IR PTC and placement of internal-external biliary drain on 11/03   - Okay to advance to regular diet  - Wean from IVF Resuscitation  - Continue IV ABx (Zosyn); I think it would be reasonable to cover with PO x7 days for home as a precaution  - Monitor abdominal examination   - Pain control prn; antiemetics prn   - Further management per primary service     - Discharge Planning: Okay for discharge home from surgical standpoint, follow up with surgery in 2 weeks, follow up with Dr Allen Norris in 1-2 weeks as instructed, monitor and record drain output at home.  All of the above findings and recommendations were discussed with the patient, patient's family, and the medical team, and all of patient's questions were answered to his expressed satisfaction.  -- Manuel Simon, PA-C [facility] 01/23/2020, 7:07 AM [phone] M-F: 7am - 4pm

## 2020-01-23 NOTE — Progress Notes (Signed)
PROGRESS NOTE    Manuel Horn  QQP:[id number] DOB: Sep 10, 1980 DOA: 01/20/2020 PCP: Lavera Guise, MD  Brief Narrative: 39 year old male presented to the emergency department for evaluation of scleral icterus 5 days duration also complains of nausea, vomiting, abdominal pain.  Patient has a history of cholelithiasis but declined previous cholecystectomy.  Patient was found to have evidence of choledocholithiasis without evidence of acute cholangitis.  Underwent ERCP on 01/20/2020.  Procedure was prematurely aborted due to procedural difficulty.  Patient was evaluated by interventional radiology who recommended proceeding with PTC with drain placement  Patient underwent this procedure successfully on 01/21/2020.  Plan to maintain drain for period of 1 to 2 weeks and repeat transhepatic approach to ERCP later date.  General surgery on consult for cholecystectomy.  11/4: Bilirubin trending down.  Jaundice appearing to improve.  Plan of care discussed with general surgery consult Dr. Hampton Abbot.  Plan to proceed with laparoscopic cholecystectomy today during this admission.  Patient aware of surgical plan.  11/5:  POD#1.  Bilirubin and liver enzymes trending down.  Mild leukocytosis, as expected postsurgically.  Patient was some abdominal soreness   Assessment & Plan:   Active Problems:   Choledocholithiasis with obstruction   Nicotine dependence   Schizoaffective disorder ([place])   Jaundice  Acute choledocholithiasis Obstructive jaundice Known history of cholelithiasis 5 days of scleral icterus Status post ERCP attempt 01/20/20 Status post PTC with drain placement 01/21/2020 Seen by general surgery, patient agreeable to cholecystectomy during this admission Biliary drain to stay in place until reevaluation by GI and transhepatic ERCP Status post laparoscopic cholecystectomy on 01/22/2020 Biliary drain remains in place Plan: DC IV fluids Continue Zosyn for now, will transition to p.o.  Augmentin at time of discharge Diet as tolerated Plan for discharge tomorrow Follow-up general surgery 2 weeks Follow-up GI 1 to 2 weeks  Nicotine dependence Encourage smoking cessation NicoDerm patch  Schizoaffective disorder Appears stable.  No medications reflected on home MAR   DVT prophylaxis: SCD Code Status: Full Family Communication: Mother at bedside 01/21/2020 Disposition Plan: Status is: Inpatient  Remains inpatient appropriate because:Inpatient level of care appropriate due to severity of illness   Dispo: The patient is from: Home              Anticipated d/c is to: Home              Anticipated d/c date is: 1 day              Patient currently is not medically stable to d/c.  Patient remains afebrile, leukocytosis improved, liver function test trend down plan on discharge home 01/24/2020.       Consultants:   GI  IR  General surgery  Procedures:  ERCP, 01/20/20  PTC with biliary drain, 01/21/2020  Antimicrobials:   Zosyn   Subjective: Patient seen and examined.  Some abdominal tenderness.  Improving prior.  No other complaints.  Tolerating p.o.  Objective: Vitals:   01/22/20 2208 01/23/20 0024 01/23/20 0422 01/23/20 0807  BP: 124/74 109/68 (!) 100/57 (!) 99/56  Pulse: (!) 53 (!) 55 (!) 52 (!) 53  Resp: '18 16 20 20  ' Temp: 98.3 F (36.8 C) 98.4 F (36.9 C) 97.7 F (36.5 C) 98.4 F (36.9 C)  TempSrc: Oral Oral Oral Oral  SpO2: 98% 100% 99% 100%  Weight:      Height:        Intake/Output Summary (Last 24 hours) at 01/23/2020 1134 Last data filed at 01/23/2020 1051  Gross per 24 hour  Intake 4847.99 ml  Output 2650 ml  Net 2197.99 ml   Filed Weights   01/20/20 1538 01/20/20 1848 01/21/20 0900  Weight: 68.9 kg 68.9 kg 68.9 kg    Examination:  General exam: Appears calm and comfortable  Respiratory system: Clear to auscultation. Respiratory effort normal. Cardiovascular system: S1 & S2 heard, RRR. No JVD, murmurs, rubs, gallops  or clicks. No pedal edema. Gastrointestinal system: Nondistended, with a drain in place, mild tenderness around drain insertion site. Central nervous system: Alert and oriented. No focal neurological deficits. Extremities: Symmetric 5 x 5 power. Skin: Scleral icterus bilaterally Psychiatry: Judgement and insight appear normal. Mood & affect appropriate.     Data Reviewed: I have personally reviewed following labs and imaging studies  CBC: Recent Labs  Lab 01/19/20 1516 01/20/20 1011 01/21/20 0426 01/22/20 0537 01/23/20 0434  WBC 10.7 9.9 7.3 7.6 11.3*  NEUTROABS 6.1  --   --  4.3 9.2*  HGB 12.1* 12.3* 10.1* 10.3* 9.9*  HCT 33.4* RESULTS UNAVAILABLE DUE TO INTERFERING SUBSTANCE RESULTS UNAVAILABLE DUE TO INTERFERING SUBSTANCE RESULTS UNAVAILABLE DUE TO INTERFERING SUBSTANCE 26.4*  MCV 92 RESULTS UNAVAILABLE DUE TO INTERFERING SUBSTANCE RESULTS UNAVAILABLE DUE TO INTERFERING SUBSTANCE RESULTS UNAVAILABLE DUE TO INTERFERING SUBSTANCE 89.2  PLT 168 168 123* 145* 703   Basic Metabolic Panel: Recent Labs  Lab 01/19/20 1516 01/20/20 1011 01/21/20 0426 01/22/20 0537 01/23/20 0434  NA 134 131* 134* 138 132*  K 4.2 4.5 UNABLE TO REPORT DUE TO ICTERUS 4.2 4.4  CL 98 96* 101 106 101  CO2 '27 26 26 25 22  ' GLUCOSE 66 195* 88 99 177*  BUN '7 8 10 10 10  ' CREATININE 1.14 UNABLE TO REPORT DUE TO ICTERUS <0.30* 0.96 1.01  CALCIUM 9.1 9.5 8.7* 8.7* 9.2   GFR: Estimated Creatinine Clearance: 95.7 mL/min (by C-G formula based on SCr of 1.01 mg/dL). Liver Function Tests: Recent Labs  Lab 01/19/20 1516 01/20/20 1011 01/21/20 0426 01/22/20 0537 01/23/20 0434  AST 166* 175* 159* 124* 93*  ALT 274* 262* 212* 197* 166*  ALKPHOS 478* 379* 290* 297* 255*  BILITOT 21.6* 24.0* 20.5* 10.6* 10.0*  PROT 6.6 7.6 5.7* 6.4* 6.8  ALBUMIN 4.0 3.4* 2.8* 2.9* 3.0*   Recent Labs  Lab 01/20/20 1011  LIPASE 34   No results for input(s): AMMONIA in the last 168 hours. Coagulation Profile: Recent  Labs  Lab 01/20/20 1856  INR 1.0   Cardiac Enzymes: No results for input(s): CKTOTAL, CKMB, CKMBINDEX, TROPONINI in the last 168 hours. BNP (last 3 results) No results for input(s): PROBNP in the last 8760 hours. HbA1C: No results for input(s): HGBA1C in the last 72 hours. CBG: No results for input(s): GLUCAP in the last 168 hours. Lipid Profile: No results for input(s): CHOL, HDL, LDLCALC, TRIG, CHOLHDL, LDLDIRECT in the last 72 hours. Thyroid Function Tests: No results for input(s): TSH, T4TOTAL, FREET4, T3FREE, THYROIDAB in the last 72 hours. Anemia Panel: No results for input(s): VITAMINB12, FOLATE, FERRITIN, TIBC, IRON, RETICCTPCT in the last 72 hours. Sepsis Labs: No results for input(s): PROCALCITON, LATICACIDVEN in the last 168 hours.  Recent Results (from the past 240 hour(s))  Respiratory Panel by RT PCR (Flu A&B, Covid) - Nasopharyngeal Swab     Status: None   Collection Time: 01/20/20 12:20 PM   Specimen: Nasopharyngeal Swab  Result Value Ref Range Status   SARS Coronavirus 2 by RT PCR NEGATIVE NEGATIVE Final    Comment: (NOTE) SARS-CoV-2 target  nucleic acids are NOT DETECTED.  The SARS-CoV-2 RNA is generally detectable in upper respiratoy specimens during the acute phase of infection. The lowest concentration of SARS-CoV-2 viral copies this assay can detect is 131 copies/mL. A negative result does not preclude SARS-Cov-2 infection and should not be used as the sole basis for treatment or other patient management decisions. A negative result may occur with  improper specimen collection/handling, submission of specimen other than nasopharyngeal swab, presence of viral mutation(s) within the areas targeted by this assay, and inadequate number of viral copies (<131 copies/mL). A negative result must be combined with clinical observations, patient history, and epidemiological information. The expected result is Negative.  Fact Sheet for Patients:    PinkCheek.be  Fact Sheet for Healthcare Providers:  GravelBags.it  This test is no t yet approved or cleared by the [place] FDA and  has been authorized for detection and/or diagnosis of SARS-CoV-2 by FDA under an Emergency Use Authorization (EUA). This EUA will remain  in effect (meaning this test can be used) for the duration of the COVID-19 declaration under Section 564(b)(1) of the Act, 21 U.S.C. section 360bbb-3(b)(1), unless the authorization is terminated or revoked sooner.     Influenza A by PCR NEGATIVE NEGATIVE Final   Influenza B by PCR NEGATIVE NEGATIVE Final    Comment: (NOTE) The Xpert Xpress SARS-CoV-2/FLU/RSV assay is intended as an aid in  the diagnosis of influenza from Nasopharyngeal swab specimens and  should not be used as a sole basis for treatment. Nasal washings and  aspirates are unacceptable for Xpert Xpress SARS-CoV-2/FLU/RSV  testing.  Fact Sheet for Patients: PinkCheek.be  Fact Sheet for Healthcare Providers: GravelBags.it  This test is not yet approved or cleared by the [place] FDA and  has been authorized for detection and/or diagnosis of SARS-CoV-2 by  FDA under an Emergency Use Authorization (EUA). This EUA will remain  in effect (meaning this test can be used) for the duration of the  Covid-19 declaration under Section 564(b)(1) of the Act, 21  U.S.C. section 360bbb-3(b)(1), unless the authorization is  terminated or revoked. Performed at [facility], [address] [place], [place] [address]          Radiology Studies: DG Abd 1 View  Result Date: 01/22/2020 CLINICAL DATA:  Biliary drain placement EXAM: ABDOMEN - 1 VIEW COMPARISON:  PTC and biliary drain placement performed 01/21/2020 FINDINGS: Biliary drain remains in place in stable position since final image of study performed yesterday. No  bowel obstruction. Mild gaseous distention of bowel diffusely. No free air organomegaly. IMPRESSION: Biliary drain remains in stable position. Electronically Signed   By: Rolm Baptise M.D.   On: 01/22/2020 20:10        Scheduled Meds: . ketorolac  30 mg Intravenous Q6H  . nicotine  21 mg Transdermal Daily  . pantoprazole (PROTONIX) IV  40 mg Intravenous Q24H   Continuous Infusions: . dextrose 5 % and 0.9% NaCl 100 mL/hr at 01/23/20 0848  . piperacillin-tazobactam (ZOSYN)  IV 12.5 mL/hr at 01/23/20 0721     LOS: 3 days    Time spent: 25 minutes    Sidney Ace, MD [facility] Pager [phone]  If 7PM-7AM, please contact night-coverage 01/23/2020, 11:34 AM

## 2020-01-24 LAB — CBC WITH DIFFERENTIAL/PLATELET
Abs Immature Granulocytes: 0.03 10*3/uL (ref 0.00–0.07)
Basophils Absolute: 0 10*3/uL (ref 0.0–0.1)
Basophils Relative: 0 %
Eosinophils Absolute: 0.2 10*3/uL (ref 0.0–0.5)
Eosinophils Relative: 2 %
HCT: 21.9 % — ABNORMAL LOW (ref 39.0–52.0)
Hemoglobin: 7.7 g/dL — ABNORMAL LOW (ref 13.0–17.0)
Immature Granulocytes: 0 %
Lymphocytes Relative: 46 %
Lymphs Abs: 4.7 10*3/uL — ABNORMAL HIGH (ref 0.7–4.0)
MCH: 33 pg (ref 26.0–34.0)
MCHC: 35.2 g/dL (ref 30.0–36.0)
MCV: 94 fL (ref 80.0–100.0)
Monocytes Absolute: 0.5 10*3/uL (ref 0.1–1.0)
Monocytes Relative: 5 %
Neutro Abs: 4.8 10*3/uL (ref 1.7–7.7)
Neutrophils Relative %: 47 %
Platelets: 148 10*3/uL — ABNORMAL LOW (ref 150–400)
RBC: 2.33 MIL/uL — ABNORMAL LOW (ref 4.22–5.81)
RDW: 18.6 % — ABNORMAL HIGH (ref 11.5–15.5)
Smear Review: NORMAL
WBC: 10.2 10*3/uL (ref 4.0–10.5)
nRBC: 0 % (ref 0.0–0.2)

## 2020-01-24 LAB — COMPREHENSIVE METABOLIC PANEL
ALT: 125 U/L — ABNORMAL HIGH (ref 0–44)
AST: 73 U/L — ABNORMAL HIGH (ref 15–41)
Albumin: 2.6 g/dL — ABNORMAL LOW (ref 3.5–5.0)
Alkaline Phosphatase: 200 U/L — ABNORMAL HIGH (ref 38–126)
Anion gap: 7 (ref 5–15)
BUN: 26 mg/dL — ABNORMAL HIGH (ref 6–20)
CO2: 25 mmol/L (ref 22–32)
Calcium: 8.7 mg/dL — ABNORMAL LOW (ref 8.9–10.3)
Chloride: 104 mmol/L (ref 98–111)
Creatinine, Ser: 1.11 mg/dL (ref 0.61–1.24)
GFR, Estimated: >60 mL/min (ref >60)
Glucose, Bld: 103 mg/dL — ABNORMAL HIGH (ref 70–99)
Potassium: 3.9 mmol/L (ref 3.5–5.1)
Sodium: 136 mmol/L (ref 135–145)
Total Bilirubin: 7.1 mg/dL — ABNORMAL HIGH (ref 0.3–1.2)
Total Protein: 5.8 g/dL — ABNORMAL LOW (ref 6.5–8.1)

## 2020-01-24 MED ORDER — MIDODRINE HCL 5 MG PO TABS
5.0000 mg | ORAL_TABLET | Freq: Three times a day (TID) | ORAL | Status: DC
  Administered 2020-01-24 – 2020-01-25 (×3): 5 mg via ORAL
  Filled 2020-01-24 (×5): qty 1

## 2020-01-24 MED ORDER — SODIUM CHLORIDE 0.9 % IV SOLN: INTRAVENOUS | Status: DC

## 2020-01-24 MED ORDER — KETOROLAC TROMETHAMINE 30 MG/ML IJ SOLN: 15.0000 mg | Freq: Four times a day (QID) | INTRAMUSCULAR | Status: DC | PRN

## 2020-01-24 MED ORDER — SODIUM CHLORIDE 0.9 % IV BOLUS
500.0000 mL | Freq: Once | INTRAVENOUS | Status: AC
  Administered 2020-01-24: 500 mL via INTRAVENOUS

## 2020-01-24 NOTE — Progress Notes (Signed)
Notified NP Manuela Schwartz regarding patient's blood pressure- 86/52. Instructions were given for a 500 mL bolus and maintenance fluids.  Will continue to monitor.  Lennon Alstrom N  01/24/2020  1:18 AM

## 2020-01-24 NOTE — Progress Notes (Signed)
PROGRESS NOTE    Manuel Horn  YYQ:[id number] DOB: 28-Sep-1980 DOA: 01/20/2020 PCP: Lavera Guise, MD  Brief Narrative: 39 year old male presented to the emergency department for evaluation of scleral icterus 5 days duration also complains of nausea, vomiting, abdominal pain.  Patient has a history of cholelithiasis but declined previous cholecystectomy.  Patient was found to have evidence of choledocholithiasis without evidence of acute cholangitis.  Underwent ERCP on 01/20/2020.  Procedure was prematurely aborted due to procedural difficulty.  Patient was evaluated by interventional radiology who recommended proceeding with PTC with drain placement  Patient underwent this procedure successfully on 01/21/2020.  Plan to maintain drain for period of 1 to 2 weeks and repeat transhepatic approach to ERCP later date.  General surgery on consult for cholecystectomy.  11/4: Bilirubin trending down.  Jaundice appearing to improve.  Plan of care discussed with general surgery consult Dr. Hampton Abbot.  Plan to proceed with laparoscopic cholecystectomy today during this admission.  Patient aware of surgical plan.  11/6: Seen and examined.  [place] blood cell count continues to trend down.  Hemoglobin also dropped 2 points.  Possibly dilutional considering IV fluids.  Liver function tests improving.  Bilirubin down to 7.   Assessment & Plan:   Active Problems:   Choledocholithiasis with obstruction   Nicotine dependence   Schizoaffective disorder ([facility])   Jaundice  Relative hypotension Bradycardia Unclear etiology.  Patient does endorse some dizziness symptoms when changing positions.  Tends to run baseline hypotensive and bradycardic.  Will monitor on IV fluids for next 24hours.  Start midodrine 5 mg 3 times daily with meals  Acute choledocholithiasis Obstructive jaundice Known history of cholelithiasis 5 days of scleral icterus Status post ERCP attempt 01/20/20 Status post PTC with drain placement  01/21/2020 Seen by general surgery, patient agreeable to cholecystectomy during this admission Biliary drain to stay in place until reevaluation by GI and transhepatic ERCP Status post laparoscopic cholecystectomy on 01/22/2020 Biliary drain remains in place Plan: IV fluids as above Continue Zosyn for now, will transition to p.o. Augmentin at time of discharge.  Plan for 7-day oral antibiotic course Regular diet as tolerated Plan for discharge tomorrow Follow-up general surgery 2 weeks Follow-up GI 1 to 2 weeks  Nicotine dependence Encourage smoking cessation NicoDerm patch  Schizoaffective disorder Appears stable.  No medications reflected on home MAR  Acute on chronic anemia No clear source of blood loss.  Could be dilutional in nature.  Monitor for next 24 hours.  Hemoglobin stable and patient remains on room air plan on discharge 01/25/2020.   DVT prophylaxis: SCD Code Status: Full Family Communication: Mother at bedside 01/21/2020 Disposition Plan: Status is: Inpatient  Remains inpatient appropriate because:Inpatient level of care appropriate due to severity of illness   Dispo: The patient is from: Home              Anticipated d/c is to: Home              Anticipated d/c date is: 1 day              Patient currently is not medically stable to d/c.  Patient remained afebrile.  Leukocytosis improved.  Liver function constantly down.   Consultants:   GI  IR  General surgery  Procedures:  ERCP, 01/20/20  PTC with biliary drain, 01/21/2020  Antimicrobials:   Zosyn   Subjective: Patient seen and examined.  Abdominal tenderness improved.  Tolerating p.o. without issue.  Drain continues to put out  Objective:  Vitals:   01/24/20 0044 01/24/20 0459 01/24/20 0505 01/24/20 0732  BP: (!) 86/52 (!) 87/48 (!) 91/52 (!) 85/50  Pulse: (!) 59 (!) 55 (!) 56 (!) 56  Resp: '16 16  18  ' Temp: 98.3 F (36.8 C) 98.1 F (36.7 C)  98.3 F (36.8 C)  TempSrc: Oral   Oral    SpO2: 99% 100% 98% 98%  Weight:      Height:        Intake/Output Summary (Last 24 hours) at 01/24/2020 1111 Last data filed at 01/24/2020 4235 Gross per 24 hour  Intake 328.05 ml  Output 2925 ml  Net -2596.95 ml   Filed Weights   01/20/20 1538 01/20/20 1848 01/21/20 0900  Weight: 68.9 kg 68.9 kg 68.9 kg    Examination:  General exam: Appears calm and comfortable  Respiratory system: Clear to auscultation. Respiratory effort normal. Cardiovascular system: S1 & S2 heard, RRR. No JVD, murmurs, rubs, gallops or clicks. No pedal edema. Gastrointestinal system: Nondistended, with a drain in place, mild tenderness around drain insertion site. Central nervous system: Alert and oriented. No focal neurological deficits. Extremities: Symmetric 5 x 5 power. Skin: Scleral icterus bilaterally Psychiatry: Judgement and insight appear normal. Mood & affect appropriate.     Data Reviewed: I have personally reviewed following labs and imaging studies  CBC: Recent Labs  Lab 01/19/20 1516 01/20/20 1011 01/21/20 0426 01/22/20 0537 01/23/20 0434 01/24/20 0604  WBC 10.7 9.9 7.3 7.6 11.3* 10.2  NEUTROABS 6.1  --   --  4.3 9.2* 4.8  HGB 12.1* 12.3* 10.1* 10.3* 9.9* 7.7*  HCT 33.4* RESULTS UNAVAILABLE DUE TO INTERFERING SUBSTANCE RESULTS UNAVAILABLE DUE TO INTERFERING SUBSTANCE RESULTS UNAVAILABLE DUE TO INTERFERING SUBSTANCE 26.4* 21.9*  MCV 92 RESULTS UNAVAILABLE DUE TO INTERFERING SUBSTANCE RESULTS UNAVAILABLE DUE TO INTERFERING SUBSTANCE RESULTS UNAVAILABLE DUE TO INTERFERING SUBSTANCE 89.2 94.0  PLT 168 168 123* 145* 154 361*   Basic Metabolic Panel: Recent Labs  Lab 01/20/20 1011 01/21/20 0426 01/22/20 0537 01/23/20 0434 01/24/20 0604  NA 131* 134* 138 132* 136  K 4.5 UNABLE TO REPORT DUE TO ICTERUS 4.2 4.4 3.9  CL 96* 101 106 101 104  CO2 '26 26 25 22 25  ' GLUCOSE 195* 88 99 177* 103*  BUN '8 10 10 10 ' 26*  CREATININE UNABLE TO REPORT DUE TO ICTERUS <0.30* 0.96 1.01 1.11   CALCIUM 9.5 8.7* 8.7* 9.2 8.7*   GFR: Estimated Creatinine Clearance: 87.1 mL/min (by C-G formula based on SCr of 1.11 mg/dL). Liver Function Tests: Recent Labs  Lab 01/20/20 1011 01/21/20 0426 01/22/20 0537 01/23/20 0434 01/24/20 0604  AST 175* 159* 124* 93* 73*  ALT 262* 212* 197* 166* 125*  ALKPHOS 379* 290* 297* 255* 200*  BILITOT 24.0* 20.5* 10.6* 10.0* 7.1*  PROT 7.6 5.7* 6.4* 6.8 5.8*  ALBUMIN 3.4* 2.8* 2.9* 3.0* 2.6*   Recent Labs  Lab 01/20/20 1011  LIPASE 34   No results for input(s): AMMONIA in the last 168 hours. Coagulation Profile: Recent Labs  Lab 01/20/20 1856  INR 1.0   Cardiac Enzymes: No results for input(s): CKTOTAL, CKMB, CKMBINDEX, TROPONINI in the last 168 hours. BNP (last 3 results) No results for input(s): PROBNP in the last 8760 hours. HbA1C: No results for input(s): HGBA1C in the last 72 hours. CBG: No results for input(s): GLUCAP in the last 168 hours. Lipid Profile: No results for input(s): CHOL, HDL, LDLCALC, TRIG, CHOLHDL, LDLDIRECT in the last 72 hours. Thyroid Function Tests: No results for input(s):  TSH, T4TOTAL, FREET4, T3FREE, THYROIDAB in the last 72 hours. Anemia Panel: No results for input(s): VITAMINB12, FOLATE, FERRITIN, TIBC, IRON, RETICCTPCT in the last 72 hours. Sepsis Labs: No results for input(s): PROCALCITON, LATICACIDVEN in the last 168 hours.  Recent Results (from the past 240 hour(s))  Respiratory Panel by RT PCR (Flu A&B, Covid) - Nasopharyngeal Swab     Status: None   Collection Time: 01/20/20 12:20 PM   Specimen: Nasopharyngeal Swab  Result Value Ref Range Status   SARS Coronavirus 2 by RT PCR NEGATIVE NEGATIVE Final    Comment: (NOTE) SARS-CoV-2 target nucleic acids are NOT DETECTED.  The SARS-CoV-2 RNA is generally detectable in upper respiratoy specimens during the acute phase of infection. The lowest concentration of SARS-CoV-2 viral copies this assay can detect is 131 copies/mL. A negative result  does not preclude SARS-Cov-2 infection and should not be used as the sole basis for treatment or other patient management decisions. A negative result may occur with  improper specimen collection/handling, submission of specimen other than nasopharyngeal swab, presence of viral mutation(s) within the areas targeted by this assay, and inadequate number of viral copies (<131 copies/mL). A negative result must be combined with clinical observations, patient history, and epidemiological information. The expected result is Negative.  Fact Sheet for Patients:  PinkCheek.be  Fact Sheet for Healthcare Providers:  GravelBags.it  This test is no t yet approved or cleared by the [place] FDA and  has been authorized for detection and/or diagnosis of SARS-CoV-2 by FDA under an Emergency Use Authorization (EUA). This EUA will remain  in effect (meaning this test can be used) for the duration of the COVID-19 declaration under Section 564(b)(1) of the Act, 21 U.S.C. section 360bbb-3(b)(1), unless the authorization is terminated or revoked sooner.     Influenza A by PCR NEGATIVE NEGATIVE Final   Influenza B by PCR NEGATIVE NEGATIVE Final    Comment: (NOTE) The Xpert Xpress SARS-CoV-2/FLU/RSV assay is intended as an aid in  the diagnosis of influenza from Nasopharyngeal swab specimens and  should not be used as a sole basis for treatment. Nasal washings and  aspirates are unacceptable for Xpert Xpress SARS-CoV-2/FLU/RSV  testing.  Fact Sheet for Patients: PinkCheek.be  Fact Sheet for Healthcare Providers: GravelBags.it  This test is not yet approved or cleared by the [place] FDA and  has been authorized for detection and/or diagnosis of SARS-CoV-2 by  FDA under an Emergency Use Authorization (EUA). This EUA will remain  in effect (meaning this test can be used) for  the duration of the  Covid-19 declaration under Section 564(b)(1) of the Act, 21  U.S.C. section 360bbb-3(b)(1), unless the authorization is  terminated or revoked. Performed at [facility], [address] [place], [place] [address]          Radiology Studies: DG Abd 1 View  Result Date: 01/22/2020 CLINICAL DATA:  Biliary drain placement EXAM: ABDOMEN - 1 VIEW COMPARISON:  PTC and biliary drain placement performed 01/21/2020 FINDINGS: Biliary drain remains in place in stable position since final image of study performed yesterday. No bowel obstruction. Mild gaseous distention of bowel diffusely. No free air organomegaly. IMPRESSION: Biliary drain remains in stable position. Electronically Signed   By: Rolm Baptise M.D.   On: 01/22/2020 20:10        Scheduled Meds: . midodrine  5 mg Oral TID WC  . nicotine  21 mg Transdermal Daily  . pantoprazole (PROTONIX) IV  40 mg Intravenous Q24H  Continuous Infusions: . sodium chloride 100 mL/hr at 01/24/20 0643  . piperacillin-tazobactam (ZOSYN)  IV 3.375 g (01/24/20 9536)     LOS: 4 days    Time spent: 25 minutes    Sidney Ace, MD [facility] Pager [phone]  If 7PM-7AM, please contact night-coverage 01/24/2020, 11:11 AM

## 2020-01-24 NOTE — Progress Notes (Signed)
Dr. Georgeann Oppenheim notified of low BP and low HR.

## 2020-01-25 LAB — CBC WITH DIFFERENTIAL/PLATELET
Abs Immature Granulocytes: 0.03 10*3/uL (ref 0.00–0.07)
Basophils Absolute: 0.1 10*3/uL (ref 0.0–0.1)
Basophils Relative: 1 %
Eosinophils Absolute: 0.4 10*3/uL (ref 0.0–0.5)
Eosinophils Relative: 4 %
HCT: 22.2 % — ABNORMAL LOW (ref 39.0–52.0)
Hemoglobin: 8.2 g/dL — ABNORMAL LOW (ref 13.0–17.0)
Immature Granulocytes: 0 %
Lymphocytes Relative: 46 %
Lymphs Abs: 4.8 10*3/uL — ABNORMAL HIGH (ref 0.7–4.0)
MCH: 34.2 pg — ABNORMAL HIGH (ref 26.0–34.0)
MCHC: 36.9 g/dL — ABNORMAL HIGH (ref 30.0–36.0)
MCV: 92.5 fL (ref 80.0–100.0)
Monocytes Absolute: 0.5 10*3/uL (ref 0.1–1.0)
Monocytes Relative: 5 %
Neutro Abs: 4.6 10*3/uL (ref 1.7–7.7)
Neutrophils Relative %: 44 %
Platelets: 154 10*3/uL (ref 150–400)
RBC: 2.4 MIL/uL — ABNORMAL LOW (ref 4.22–5.81)
RDW: 17.5 % — ABNORMAL HIGH (ref 11.5–15.5)
Smear Review: NORMAL
WBC: 10.4 10*3/uL (ref 4.0–10.5)
nRBC: 0 % (ref 0.0–0.2)

## 2020-01-25 LAB — COMPREHENSIVE METABOLIC PANEL
ALT: 130 U/L — ABNORMAL HIGH (ref 0–44)
AST: 79 U/L — ABNORMAL HIGH (ref 15–41)
Albumin: 2.8 g/dL — ABNORMAL LOW (ref 3.5–5.0)
Alkaline Phosphatase: 193 U/L — ABNORMAL HIGH (ref 38–126)
Anion gap: 5 (ref 5–15)
BUN: 17 mg/dL (ref 6–20)
CO2: 22 mmol/L (ref 22–32)
Calcium: 8.6 mg/dL — ABNORMAL LOW (ref 8.9–10.3)
Chloride: 108 mmol/L (ref 98–111)
Creatinine, Ser: 1 mg/dL (ref 0.61–1.24)
GFR, Estimated: >60 mL/min (ref >60)
Glucose, Bld: 99 mg/dL (ref 70–99)
Potassium: 3.9 mmol/L (ref 3.5–5.1)
Sodium: 135 mmol/L (ref 135–145)
Total Bilirubin: 7.1 mg/dL — ABNORMAL HIGH (ref 0.3–1.2)
Total Protein: 6.2 g/dL — ABNORMAL LOW (ref 6.5–8.1)

## 2020-01-25 MED ORDER — OXYCODONE HCL 5 MG PO TABS: 5.0000 mg | ORAL_TABLET | Freq: Four times a day (QID) | ORAL | 0 refills | Status: DC | PRN

## 2020-01-25 MED ORDER — AMOXICILLIN-POT CLAVULANATE 875-125 MG PO TABS: 1.0000 | ORAL_TABLET | Freq: Two times a day (BID) | ORAL | 0 refills | Status: AC

## 2020-01-25 NOTE — Discharge Summary (Signed)
Physician Discharge Summary  Manuel Horn RJJ:[id number] DOB: [birth year] DOA: 01/20/2020  PCP: Lavera Guise, MD  Admit date: 01/20/2020 Discharge date: 01/25/2020  Admitted From: Home Disposition:  Home  Recommendations for Outpatient Follow-up:  1. Follow up with PCP in 1-2 weeks 2. Follow-up with general surgery in 2 weeks, Dr. Hampton Abbot 3. Follow-up with GI in 2 weeks, Dr. Allen Norris  Home Health: No Equipment/Devices: Biliary drain Discharge Condition: Stable CODE STATUS: Full Diet recommendation: Regular  Brief/Interim Summary: `39 year old male presented to the emergency department for evaluation of scleral icterus 5 days duration also complains of nausea, vomiting, abdominal pain.  Patient has a history of cholelithiasis but declined previous cholecystectomy.  Patient was found to have evidence of choledocholithiasis without evidence of acute cholangitis.  Underwent ERCP on 01/20/2020.  Procedure was prematurely aborted due to procedural difficulty.  Patient was evaluated by interventional radiology who recommended proceeding with PTC with drain placement  Patient underwent this procedure successfully on 01/21/2020.  Plan to maintain drain for period of 1 to 2 weeks and repeat transhepatic approach to ERCP later date.  General surgery on consult for cholecystectomy.  11/4: Bilirubin trending down.  Jaundice appearing to improve.  Plan of care discussed with general surgery consult Dr. Hampton Abbot.  Plan to proceed with laparoscopic cholecystectomy today during this admission.  Patient aware of surgical plan.  11/6: Seen and examined.  Trejos blood cell count continues to trend down.  Hemoglobin also dropped 2 points.  Possibly dilutional considering IV fluids.  Liver function tests improving.  Bilirubin down to 7.  11/7: Seen on the day of discharge.  Bilirubin continues to trend down.  No fevers over interval.  Pain well controlled.  Okay for discharge home at this time.  Will complete 7  days of Augmentin p.o. post discharge.  Prescribed.  Will follow up with general surgery in 2 weeks for wound check.  Will follow up in GI with 2 weeks for outpatient ERCP.    Office phone numbers for Dr. Allen Norris and Dr. Hampton Abbot included in discharge packet  Discharge Diagnoses:  Active Problems:   Choledocholithiasis with obstruction   Nicotine dependence   Schizoaffective disorder (HCC)   Jaundice  Relative hypotension Bradycardia Unclear etiology.    Mildly symptomatic.  Patient does endorse some dizziness symptoms when changing positions.  Tends to run baseline hypotensive and bradycardic.    Midodrine improved to be ineffective.  Will discontinue.  Encourage p.o. fluid intake.  Acute choledocholithiasis Obstructive jaundice Known history of cholelithiasis 5 days of scleral icterus Status post ERCP attempt 01/20/20 Status post PTC with drain placement 01/21/2020 Seen by general surgery, patient agreeable to cholecystectomy during this admission Biliary drain to stay in place until reevaluation by GI and transhepatic ERCP Status post laparoscopic cholecystectomy on 01/22/2020 Biliary drain remains in place On intravenous Zosyn while in-house Transition to p.o. Augmentin on discharge.  Complete additional 7 days Okay for diet as tolerated Drain to remain in place post discharge Follow-up general surgery 2 weeks Follow-up GI 1 to 2 weeks  Nicotine dependence Encourage smoking cessation NicoDerm patch  Schizoaffective disorder Appears stable.  No medications reflected on home MAR  Acute on chronic anemia Improved at time of discharge.  Unclear etiology.  Possibly dilutional.  Possibly postsurgical.  No oxygen requirement time of discharge.  Discharge Instructions  Discharge Instructions    Diet - low sodium heart healthy   Complete by: As directed    Increase activity slowly   Complete by: As  directed    No wound care   Complete by: As directed      Allergies as of  01/25/2020      Reactions   Sulfa Antibiotics Rash      Medication List    TAKE these medications   amoxicillin-clavulanate 875-125 MG tablet Commonly known as: Augmentin Take 1 tablet by mouth 2 (two) times daily for 7 days.   oxyCODONE 5 MG immediate release tablet Commonly known as: Oxy IR/ROXICODONE Take 1 tablet (5 mg total) by mouth every 6 (six) hours as needed for moderate pain or severe pain.       Follow-up Information    Lucilla Lame, MD. Schedule an appointment as soon as possible for a visit in 1 week(s).   Specialty: Gastroenterology Why: Follow up in 1-2 weeks for ERCP and stone retrieval Contact information: [place] [address] [phone]        Lavera Guise, MD. Schedule an appointment as soon as possible for a visit in 1 week(s).   Specialty: Internal Medicine Contact information: [place] [address] [phone]        Olean Ree, MD. Schedule an appointment as soon as possible for a visit in 2 week(s).   Specialty: General Surgery Why: General surgeon Contact information: [address] [place] [address] [phone]              Allergies  Allergen Reactions  . Sulfa Antibiotics Rash    Consultations:  GI  General surgery   Procedures/Studies: DG Abd 1 View  Result Date: 01/22/2020 CLINICAL DATA:  Biliary drain placement EXAM: ABDOMEN - 1 VIEW COMPARISON:  PTC and biliary drain placement performed 01/21/2020 FINDINGS: Biliary drain remains in place in stable position since final image of study performed yesterday. No bowel obstruction. Mild gaseous distention of bowel diffusely. No free air organomegaly. IMPRESSION: Biliary drain remains in stable position. Electronically Signed   By: Rolm Baptise M.D.   On: 01/22/2020 20:10   DG C-Arm 1-60 Min-No Report  Result Date: 01/20/2020 Fluoroscopy was utilized by the requesting physician.  No radiographic interpretation.    IR PERCUTANEOUS TRANSHEPATIC CHOLANGIOGRAM  Result Date: 01/21/2020 INDICATION: 39 year old with jaundice and biliary obstruction. Biliary obstruction secondary to stones. ERCP was unable to decompress the biliary system. Plan for PTC and drain placement. EXAM: 1. PERCUTANEOUS TRANSHEPATIC CHOLANGIOGRAM USING ULTRASOUND AND FLUOROSCOPIC GUIDANCE 2. PLACEMENT OF INTERNAL/EXTERNAL BILIARY DRAIN MEDICATIONS: Zosyn 3.375 g; The antibiotic was administered within an appropriate time frame prior to the initiation of the procedure. ANESTHESIA/SEDATION: Moderate (conscious) sedation was employed during this procedure. A total of Versed 1.0 mg and Fentanyl 50 mcg was administered intravenously. Moderate Sedation Time: 22 minutes minutes. The patient's level of consciousness and vital signs were monitored continuously by radiology nursing throughout the procedure under my direct supervision. FLUOROSCOPY TIME:  Fluoroscopy Time: 4 minutes 30 seconds (26.2 mGy). COMPLICATIONS: None immediate. PROCEDURE: Informed written consent was obtained from the patient after a thorough discussion of the procedural risks, benefits and alternatives. All questions were addressed. A timeout was performed prior to the initiation of the procedure. The anterior and right side of the abdomen was prepped and draped in sterile fashion. Maximal barrier sterile technique was utilized including caps, mask, sterile gowns, sterile gloves, sterile drape, hand hygiene and skin antiseptic. Ultrasound demonstrated markedly dilated bile ducts in the left hepatic lobe. Ultrasound image was saved for documentation. Skin was anesthetized using 1% lidocaine. Using ultrasound guidance,  21 gauge needle was directed into a dilated left hepatic bile duct. Wire was easily advanced into the biliary system and a transitional dilator set was placed. Cholangiogram images were obtained through the catheter. A Bentson wire was placed and a C2 catheter was advanced  into the distal common bile duct and eventually into the duodenum using a Glidewire. A straight glide catheter was advanced into the distal duodenum over a Bentson wire. The tract was dilated to accommodate a 10 [place] biliary drain. Drain was advanced into the duodenum. Additional cholangiogram images were obtained. Large amount of clear yellowish bile was removed from the biliary system. Catheter was secured to skin with Prolene suture. The drain was flushed with saline and attached to a gravity bag. Fluoroscopic and ultrasound images were taken and saved for documentation. FINDINGS: Severe intrahepatic and extrahepatic biliary dilatation. Common bile duct is markedly dilated with a distal common bile duct obstruction. Cystic duct is patent and evidence for cholelithiasis. Drain was successfully advanced into the duodenum. Final image demonstrates stones within the common bile duct. IMPRESSION: 1. Severe intrahepatic and extrahepatic biliary dilatation due to obstructing stones. 2. Successful percutaneous transhepatic cholangiogram and drain placement. Drain extends into the duodenum. Electronically Signed   By: Markus Daft M.D.   On: 01/21/2020 10:53   US ABDOMEN LIMITED RUQ (LIVER/GB)  Result Date: 01/20/2020 CLINICAL DATA:  Jaundice EXAM: ULTRASOUND ABDOMEN LIMITED RIGHT UPPER QUADRANT COMPARISON:  11/28/2018 right upper quadrant ultrasound and CT renal stone. FINDINGS: Gallbladder: Multiple mobile intraluminal gallstones measuring up to 8.7 mm with biliary sludge. No gallbladder wall thickening. No pericholecystic free fluid. Sonographic Murphy sign was negative. Common bile duct: Diameter: Newly dilated measuring 15.9 mm with intraluminal calculi measuring up to 11.5 mm. Liver: No focal lesion identified. Within normal limits in parenchymal echogenicity. The intrahepatic bile ducts are dilated. Portal vein is patent on color Doppler imaging with normal direction of blood flow towards the liver. Other:  None. IMPRESSION: New choledocholithiasis with intrahepatic biliary dilatation. Stones within the common bile duct measure up to 11.5 mm. Sequela of cholelithiasis without cholecystitis. Electronically Signed   By: Primitivo Gauze M.D.   On: 01/20/2020 12:02    (Echo, Carotid, EGD, Colonoscopy, ERCP)    Subjective: Seen and examined on the day of discharge.  No distress, feels well.  Drain remains in place.  Patient stable for discharge home at this time.  Follow-up with general surgery and GI within 2 weeks of discharge.  Discharge Exam: Vitals:   01/25/20 0430 01/25/20 0800  BP: (!) 93/54 95/68  Pulse: 62 98  Resp: 18 18  Temp: 97.6 F (36.4 C) 97.6 F (36.4 C)  SpO2: 100% 100%   Vitals:   01/24/20 1726 01/24/20 1924 01/25/20 0430 01/25/20 0800  BP: (!) 100/55 (!) 99/55 (!) 93/54 95/68  Pulse: (!) 55 (!) 57 62 98  Resp: '16 16 18 18  ' Temp: 98.6 F (37 C) 98 F (36.7 C) 97.6 F (36.4 C) 97.6 F (36.4 C)  TempSrc: Oral Oral Oral Oral  SpO2: 100% 100% 100% 100%  Weight:      Height:        General: Pt is alert, awake, not in acute distress Cardiovascular: RRR, S1/S2 +, no rubs, no gallops Respiratory: CTA bilaterally, no wheezing, no rhonchi Abdominal: Soft, NT, ND, bowel sounds + Extremities: no edema, no cyanosis    The results of significant diagnostics from this hospitalization (including imaging, microbiology, ancillary and laboratory) are listed below for reference.  Microbiology: Recent Results (from the past 240 hour(s))  Respiratory Panel by RT PCR (Flu A&B, Covid) - Nasopharyngeal Swab     Status: None   Collection Time: 01/20/20 12:20 PM   Specimen: Nasopharyngeal Swab  Result Value Ref Range Status   SARS Coronavirus 2 by RT PCR NEGATIVE NEGATIVE Final    Comment: (NOTE) SARS-CoV-2 target nucleic acids are NOT DETECTED.  The SARS-CoV-2 RNA is generally detectable in upper respiratoy specimens during the acute phase of infection. The  lowest concentration of SARS-CoV-2 viral copies this assay can detect is 131 copies/mL. A negative result does not preclude SARS-Cov-2 infection and should not be used as the sole basis for treatment or other patient management decisions. A negative result may occur with  improper specimen collection/handling, submission of specimen other than nasopharyngeal swab, presence of viral mutation(s) within the areas targeted by this assay, and inadequate number of viral copies (<131 copies/mL). A negative result must be combined with clinical observations, patient history, and epidemiological information. The expected result is Negative.  Fact Sheet for Patients:  PinkCheek.be  Fact Sheet for Healthcare Providers:  GravelBags.it  This test is no t yet approved or cleared by the [place] FDA and  has been authorized for detection and/or diagnosis of SARS-CoV-2 by FDA under an Emergency Use Authorization (EUA). This EUA will remain  in effect (meaning this test can be used) for the duration of the COVID-19 declaration under Section 564(b)(1) of the Act, 21 U.S.C. section 360bbb-3(b)(1), unless the authorization is terminated or revoked sooner.     Influenza A by PCR NEGATIVE NEGATIVE Final   Influenza B by PCR NEGATIVE NEGATIVE Final    Comment: (NOTE) The Xpert Xpress SARS-CoV-2/FLU/RSV assay is intended as an aid in  the diagnosis of influenza from Nasopharyngeal swab specimens and  should not be used as a sole basis for treatment. Nasal washings and  aspirates are unacceptable for Xpert Xpress SARS-CoV-2/FLU/RSV  testing.  Fact Sheet for Patients: PinkCheek.be  Fact Sheet for Healthcare Providers: GravelBags.it  This test is not yet approved or cleared by the [place] FDA and  has been authorized for detection and/or diagnosis of SARS-CoV-2 by  FDA under  an Emergency Use Authorization (EUA). This EUA will remain  in effect (meaning this test can be used) for the duration of the  Covid-19 declaration under Section 564(b)(1) of the Act, 21  U.S.C. section 360bbb-3(b)(1), unless the authorization is  terminated or revoked. Performed at [facility], [address] [place], [place] [address]      Labs: BNP (last 3 results) No results for input(s): BNP in the last 8760 hours. Basic Metabolic Panel: Recent Labs  Lab 01/21/20 0426 01/22/20 0537 01/23/20 0434 01/24/20 0604 01/25/20 0326  NA 134* 138 132* 136 135  K UNABLE TO REPORT DUE TO ICTERUS 4.2 4.4 3.9 3.9  CL 101 106 101 104 108  CO2 '26 25 22 25 22  ' GLUCOSE 88 99 177* 103* 99  BUN '10 10 10 ' 26* 17  CREATININE <0.30* 0.96 1.01 1.11 1.00  CALCIUM 8.7* 8.7* 9.2 8.7* 8.6*   Liver Function Tests: Recent Labs  Lab 01/21/20 0426 01/22/20 0537 01/23/20 0434 01/24/20 0604 01/25/20 0326  AST 159* 124* 93* 73* 79*  ALT 212* 197* 166* 125* 130*  ALKPHOS 290* 297* 255* 200* 193*  BILITOT 20.5* 10.6* 10.0* 7.1* 7.1*  PROT 5.7* 6.4* 6.8 5.8* 6.2*  ALBUMIN 2.8* 2.9* 3.0* 2.6* 2.8*   Recent Labs  Lab  01/20/20 1011  LIPASE 34   No results for input(s): AMMONIA in the last 168 hours. CBC: Recent Labs  Lab 01/19/20 1516 01/20/20 1011 01/21/20 0426 01/22/20 0537 01/23/20 0434 01/24/20 0604 01/25/20 0326  WBC 10.7   < > 7.3 7.6 11.3* 10.2 10.4  NEUTROABS 6.1  --   --  4.3 9.2* 4.8 4.6  HGB 12.1*   < > 10.1* 10.3* 9.9* 7.7* 8.2*  HCT 33.4*   < > RESULTS UNAVAILABLE DUE TO INTERFERING SUBSTANCE RESULTS UNAVAILABLE DUE TO INTERFERING SUBSTANCE 26.4* 21.9* 22.2*  MCV 92   < > RESULTS UNAVAILABLE DUE TO INTERFERING SUBSTANCE RESULTS UNAVAILABLE DUE TO INTERFERING SUBSTANCE 89.2 94.0 92.5  PLT 168   < > 123* 145* 154 148* 154   < > = values in this interval not displayed.   Cardiac Enzymes: No results for input(s): CKTOTAL, CKMB, CKMBINDEX, TROPONINI in the last 168  hours. BNP: Invalid input(s): POCBNP CBG: No results for input(s): GLUCAP in the last 168 hours. D-Dimer No results for input(s): DDIMER in the last 72 hours. Hgb A1c No results for input(s): HGBA1C in the last 72 hours. Lipid Profile No results for input(s): CHOL, HDL, LDLCALC, TRIG, CHOLHDL, LDLDIRECT in the last 72 hours. Thyroid function studies No results for input(s): TSH, T4TOTAL, T3FREE, THYROIDAB in the last 72 hours.  Invalid input(s): FREET3 Anemia work up No results for input(s): VITAMINB12, FOLATE, FERRITIN, TIBC, IRON, RETICCTPCT in the last 72 hours. Urinalysis    Component Value Date/Time   COLORURINE AMBER (A) 01/20/2020 1011   APPEARANCEUR HAZY (A) 01/20/2020 1011   APPEARANCEUR Cloudy (A) 11/26/2019 0947   LABSPEC 1.018 01/20/2020 1011   PHURINE 5.0 01/20/2020 1011   GLUCOSEU NEGATIVE 01/20/2020 1011   HGBUR NEGATIVE 01/20/2020 [place] 01/20/2020 1011   BILIRUBINUR Negative 11/26/2019 0947   KETONESUR NEGATIVE 01/20/2020 1011   PROTEINUR NEGATIVE 01/20/2020 1011   NITRITE NEGATIVE 01/20/2020 1011   LEUKOCYTESUR MODERATE (A) 01/20/2020 1011   Sepsis Labs Invalid input(s): PROCALCITONIN,  WBC,  LACTICIDVEN Microbiology Recent Results (from the past 240 hour(s))  Respiratory Panel by RT PCR (Flu A&B, Covid) - Nasopharyngeal Swab     Status: None   Collection Time: 01/20/20 12:20 PM   Specimen: Nasopharyngeal Swab  Result Value Ref Range Status   SARS Coronavirus 2 by RT PCR NEGATIVE NEGATIVE Final    Comment: (NOTE) SARS-CoV-2 target nucleic acids are NOT DETECTED.  The SARS-CoV-2 RNA is generally detectable in upper respiratoy specimens during the acute phase of infection. The lowest concentration of SARS-CoV-2 viral copies this assay can detect is 131 copies/mL. A negative result does not preclude SARS-Cov-2 infection and should not be used as the sole basis for treatment or other patient management decisions. A negative result  may occur with  improper specimen collection/handling, submission of specimen other than nasopharyngeal swab, presence of viral mutation(s) within the areas targeted by this assay, and inadequate number of viral copies (<131 copies/mL). A negative result must be combined with clinical observations, patient history, and epidemiological information. The expected result is Negative.  Fact Sheet for Patients:  PinkCheek.be  Fact Sheet for Healthcare Providers:  GravelBags.it  This test is no t yet approved or cleared by the [place] FDA and  has been authorized for detection and/or diagnosis of SARS-CoV-2 by FDA under an Emergency Use Authorization (EUA). This EUA will remain  in effect (meaning this test can be used) for the duration of the COVID-19 declaration under Section  564(b)(1) of the Act, 21 U.S.C. section 360bbb-3(b)(1), unless the authorization is terminated or revoked sooner.     Influenza A by PCR NEGATIVE NEGATIVE Final   Influenza B by PCR NEGATIVE NEGATIVE Final    Comment: (NOTE) The Xpert Xpress SARS-CoV-2/FLU/RSV assay is intended as an aid in  the diagnosis of influenza from Nasopharyngeal swab specimens and  should not be used as a sole basis for treatment. Nasal washings and  aspirates are unacceptable for Xpert Xpress SARS-CoV-2/FLU/RSV  testing.  Fact Sheet for Patients: PinkCheek.be  Fact Sheet for Healthcare Providers: GravelBags.it  This test is not yet approved or cleared by the [place] FDA and  has been authorized for detection and/or diagnosis of SARS-CoV-2 by  FDA under an Emergency Use Authorization (EUA). This EUA will remain  in effect (meaning this test can be used) for the duration of the  Covid-19 declaration under Section 564(b)(1) of the Act, 21  U.S.C. section 360bbb-3(b)(1), unless the authorization is  terminated  or revoked. Performed at [facility], [address] [place], [place] [address]      Time coordinating discharge: Over 30 minutes  SIGNED:   Sidney Ace, MD  [facility] 01/25/2020, 11:14 AM Pager   If 7PM-7AM, please contact night-coverage

## 2020-01-25 NOTE — Progress Notes (Signed)
Manuel Horn to be D/C'd home per MD order.  Discussed prescriptions and follow up appointments with the patient. Prescriptions given to patient, medication list explained in detail. Pt verbalized understanding.  Allergies as of 01/25/2020       Reactions   Sulfa Antibiotics Rash        Medication List     TAKE these medications    amoxicillin-clavulanate 875-125 MG tablet Commonly known as: Augmentin Take 1 tablet by mouth 2 (two) times daily for 7 days.   oxyCODONE 5 MG immediate release tablet Commonly known as: Oxy IR/ROXICODONE Take 1 tablet (5 mg total) by mouth every 6 (six) hours as needed for moderate pain or severe pain.        Vitals:   01/25/20 0430 01/25/20 0800  BP: (!) 93/54 95/68  Pulse: 62 98  Resp: 18 18  Temp: 97.6 F (36.4 C) 97.6 F (36.4 C)  SpO2: 100% 100%    Skin clean, dry and intact without evidence of skin break down, no evidence of skin tears noted. Education on drain given. Patient showed how to change dressing and how to empty. IV catheter discontinued intact. Site without signs and symptoms of complications. Dressing and pressure applied. Pt denies pain at this time. No complaints noted.  An After Visit Summary was printed and given to the patient. Patient escorted via WC, and D/C home via private auto.  Manuel Horn

## 2020-01-26 LAB — SURGICAL PATHOLOGY

## 2020-01-27 ENCOUNTER — Other Ambulatory Visit: Payer: Self-pay

## 2020-01-27 DIAGNOSIS — R17 Unspecified jaundice: Secondary | ICD-10-CM

## 2020-01-27 DIAGNOSIS — K8051 Calculus of bile duct without cholangitis or cholecystitis with obstruction: Secondary | ICD-10-CM

## 2020-01-29 NOTE — Progress Notes (Signed)
Scanned in outside lab report

## 2020-02-03 ENCOUNTER — Other Ambulatory Visit: Payer: Self-pay

## 2020-02-03 ENCOUNTER — Encounter: Payer: Self-pay | Admitting: Internal Medicine

## 2020-02-03 ENCOUNTER — Ambulatory Visit: Payer: Medicaid Other | Admitting: Internal Medicine

## 2020-02-03 VITALS — BP 110/80 | HR 88 | Temp 97.6°F | Resp 16 | Ht 72.0 in | Wt 153.0 lb

## 2020-02-03 DIAGNOSIS — R7989 Other specified abnormal findings of blood chemistry: Secondary | ICD-10-CM | POA: Diagnosis not present

## 2020-02-03 DIAGNOSIS — Z9049 Acquired absence of other specified parts of digestive tract: Secondary | ICD-10-CM

## 2020-02-03 NOTE — Progress Notes (Signed)
[facility] [address] [place], [place] [address]  Internal MEDICINE  Office Visit Note  Patient Name: Manuel Horn  [id number]  [id number]  Date of Service: 02/03/2020     Chief Complaint  Patient presents with  . Hospitalization Follow-up     HPI Pt is here for recent hospital follow up. Pt developed obstructive jaundice due to gallstones, was hospitalized with total bilirubin of 21, had cholecystectomy. His hospital course was uneventful. He is suppose to have surgical follow up. Feels better, appetite is improving. His Ferritin level was elevated as well about 1000, can be acute phase response , will need to be repeated   Current Medication: Outpatient Encounter Medications as of 02/03/2020  Medication Sig  . oxyCODONE (OXY IR/ROXICODONE) 5 MG immediate release tablet Take 1 tablet (5 mg total) by mouth every 6 (six) hours as needed for moderate pain or severe pain.   No facility-administered encounter medications on file as of 02/03/2020.    Surgical History: Past Surgical History:  Procedure Laterality Date  . CHOLECYSTECTOMY    . ENDOSCOPIC RETROGRADE CHOLANGIOPANCREATOGRAPHY (ERCP) WITH PROPOFOL N/A 01/20/2020   Procedure: ENDOSCOPIC RETROGRADE CHOLANGIOPANCREATOGRAPHY (ERCP) WITH PROPOFOL;  Surgeon: Midge Minium, MD;  Location: [facility] ENDOSCOPY;  Service: Endoscopy;  Laterality: N/A;  . IR PERCUTANEOUS TRANSHEPATIC CHOLANGIOGRAM  01/21/2020  . LEG SURGERY Left     Medical History: Past Medical History:  Diagnosis Date  . Schizoaffective disorder (HCC)     Family History: Family History  Problem Relation Age of Onset  . Diabetes Mother   . Hypertension Mother     Social History   Socioeconomic History  . Marital status: Single    Spouse name: Not on file  . Number of children: 0  . Years of education: Not on file  . Highest education level: Not on file  Occupational History  . Occupation: disability  Tobacco Use  . Smoking status:  Current Every Day Smoker    Packs/day: 2.00    Types: Cigarettes  . Smokeless tobacco: Never Used  Vaping Use  . Vaping Use: Never used  Substance and Sexual Activity  . Alcohol use: No  . Drug use: No  . Sexual activity: Not on file  Other Topics Concern  . Not on file  Social History Narrative  . Not on file   Social Determinants of Health   Financial Resource Strain:   . Difficulty of Paying Living Expenses: Not on file  Food Insecurity:   . Worried About Programme researcher, broadcasting/film/video in the Last Year: Not on file  . Ran Out of Food in the Last Year: Not on file  Transportation Needs:   . Lack of Transportation (Medical): Not on file  . Lack of Transportation (Non-Medical): Not on file  Physical Activity:   . Days of Exercise per Week: Not on file  . Minutes of Exercise per Session: Not on file  Stress:   . Feeling of Stress : Not on file  Social Connections:   . Frequency of Communication with Friends and Family: Not on file  . Frequency of Social Gatherings with Friends and Family: Not on file  . Attends Religious Services: Not on file  . Active Member of Clubs or Organizations: Not on file  . Attends Banker Meetings: Not on file  . Marital Status: Not on file  Intimate Partner Violence:   . Fear of Current or Ex-Partner: Not on file  . Emotionally Abused: Not on file  .  Physically Abused: Not on file  . Sexually Abused: Not on file      Review of Systems  Constitutional: Negative for chills, fatigue and unexpected weight change.  HENT: Positive for postnasal drip. Negative for congestion, rhinorrhea, sneezing and sore throat.   Eyes: Negative for redness.  Respiratory: Negative for cough, chest tightness and shortness of breath.   Cardiovascular: Negative for chest pain and palpitations.  Gastrointestinal: Positive for abdominal pain. Negative for constipation, diarrhea, nausea and vomiting.  Genitourinary: Negative for dysuria and frequency.   Musculoskeletal: Negative for arthralgias, back pain, joint swelling and neck pain.  Skin: Negative for rash.  Neurological: Negative.  Negative for tremors and numbness.  Hematological: Negative for adenopathy. Does not bruise/bleed easily.  Psychiatric/Behavioral: Negative for behavioral problems (Depression), sleep disturbance and suicidal ideas. The patient is not nervous/anxious.     Vital Signs: BP 110/80   Pulse 88   Temp 97.6 F (36.4 C)   Resp 16   Ht 6' (1.829 m)   Wt 153 lb (69.4 kg)   SpO2 98%   BMI 20.75 kg/m    Physical Exam Constitutional:      General: He is not in acute distress.    Appearance: He is well-developed. He is not diaphoretic.  HENT:     Head: Normocephalic and atraumatic.     Mouth/Throat:     Pharynx: No oropharyngeal exudate.  Eyes:     Pupils: Pupils are equal, round, and reactive to light.  Neck:     Thyroid: No thyromegaly.     Vascular: No JVD.     Trachea: No tracheal deviation.  Cardiovascular:     Rate and Rhythm: Normal rate and regular rhythm.     Heart sounds: Normal heart sounds. No murmur heard.  No friction rub. No gallop.   Pulmonary:     Effort: Pulmonary effort is normal. No respiratory distress.     Breath sounds: No wheezing or rales.  Chest:     Chest wall: No tenderness.  Abdominal:     General: Bowel sounds are normal.     Palpations: Abdomen is soft.     Comments: Dressing present, no erythema noted   Musculoskeletal:        General: Normal range of motion.     Cervical back: Normal range of motion and neck supple.  Lymphadenopathy:     Cervical: No cervical adenopathy.  Skin:    General: Skin is warm and dry.  Neurological:     Mental Status: He is alert and oriented to person, place, and time.     Cranial Nerves: No cranial nerve deficit.  Psychiatric:        Behavior: Behavior normal.        Thought Content: Thought content normal.        Judgment: Judgment normal.       Assessment/Plan: 1.  Elevated ferritin Will repeat for now. If continues to be elevated,will need to see hematology  - Ferritin; Future  2. S/P cholecystectomy Pt has appointment with surgery for follow up   General Counseling: Manuel Horn understanding of the findings of todays visit and agrees with plan of treatment. I have discussed any further diagnostic evaluation that may be needed or ordered today. We also reviewed his medications today. he has been encouraged to call the office with any questions or concerns that should arise related to todays visit.  I have reviewed all medical records from hospital follow up including radiology reports and  consults from other physicians. Appropriate follow up diagnostics will be scheduled as needed. Patient/ Family understands the plan of treatment. Time spent 30 minutes.   Dr Lyndon Code, MD Internal Medicine

## 2020-02-09 ENCOUNTER — Other Ambulatory Visit: Payer: Self-pay

## 2020-02-09 ENCOUNTER — Ambulatory Visit (INDEPENDENT_AMBULATORY_CARE_PROVIDER_SITE_OTHER): Payer: Medicaid Other | Admitting: Surgery

## 2020-02-09 ENCOUNTER — Encounter: Payer: Self-pay | Admitting: Surgery

## 2020-02-09 VITALS — BP 116/76 | HR 67 | Temp 97.9°F | Ht 72.0 in | Wt 146.0 lb

## 2020-02-09 DIAGNOSIS — Z09 Encounter for follow-up examination after completed treatment for conditions other than malignant neoplasm: Secondary | ICD-10-CM

## 2020-02-09 DIAGNOSIS — K8051 Calculus of bile duct without cholangitis or cholecystitis with obstruction: Secondary | ICD-10-CM

## 2020-02-09 DIAGNOSIS — R7989 Other specified abnormal findings of blood chemistry: Secondary | ICD-10-CM

## 2020-02-09 NOTE — Patient Instructions (Addendum)
Keep your appointment with Dr Servando Snare for the procedure on 02/17/20.  GENERAL POST-OPERATIVE PATIENT INSTRUCTIONS   WOUND CARE INSTRUCTIONS:  Keep a dry clean dressing on the wound if there is drainage. The initial bandage may be removed after 24 hours.  Once the wound has quit draining you may leave it open to air.  If clothing rubs against the wound or causes irritation and the wound is not draining you may cover it with a dry dressing during the daytime.  Try to keep the wound dry and avoid ointments on the wound unless directed to do so.  If the wound becomes bright red and painful or starts to drain infected material that is not clear, please contact your physician immediately.  If the wound is mildly pink and has a thick firm ridge underneath it, this is normal, and is referred to as a healing ridge.  This will resolve over the next 4-6 weeks.  BATHING: You may shower if you have been informed of this by your surgeon. However, Please do not submerge in a tub, hot tub, or pool until incisions are completely sealed or have been told by your surgeon that you may do so.  DIET:  You may eat any foods that you can tolerate.  It is a good idea to eat a high fiber diet and take in plenty of fluids to prevent constipation.  If you do become constipated you may want to take a mild laxative or take ducolax tablets on a daily basis until your bowel habits are regular.  Constipation can be very uncomfortable, along with straining, after recent surgery.  ACTIVITY:  You are encouraged to cough and deep breath or use your incentive spirometer if you were given one, every 15-30 minutes when awake.  This will help prevent respiratory complications and low grade fevers post-operatively if you had a general anesthetic.  You may want to hug a pillow when coughing and sneezing to add additional support to the surgical area, if you had abdominal or chest surgery, which will decrease pain during these times.  You are  encouraged to walk and engage in light activity for the next two weeks.  You should not lift more than 20 pounds, until 4 to 6 weeks after surgery as it could put you at increased risk for complications.  Twenty pounds is roughly equivalent to a plastic bag of groceries. At that time- Listen to your body when lifting, if you have pain when lifting, stop and then try again in a few days. Soreness after doing exercises or activities of daily living is normal as you get back in to your normal routine.  MEDICATIONS:  Try to take narcotic medications and anti-inflammatory medications, such as tylenol, ibuprofen, naprosyn, etc., with food.  This will minimize stomach upset from the medication.  Should you develop nausea and vomiting from the pain medication, or develop a rash, please discontinue the medication and contact your physician.  You should not drive, make important decisions, or operate machinery when taking narcotic pain medication.  SUNBLOCK Use sun block to incision area over the next year if this area will be exposed to sun. This helps decrease scarring and will allow you avoid a permanent darkened area over your incision.  QUESTIONS:  Please feel free to call our office if you have any questions, and we will be glad to assist you. [phone]

## 2020-02-09 NOTE — Progress Notes (Signed)
02/09/2020  HPI: Manuel Horn is a 39 y.o. male s/p robotic assisted cholecystectomy with ICG cholangiogram on 01/22/2020 for choledocholithiasis.  Patient had an attempted ERCP on 11/2 which was unsuccessful and had a subsequent PTC drain placement with IR on 11/3.  Patient presents today for follow-up.  Patient reports that he is doing very well.  He denies any significant pain at the incision center reports some tenderness in the epigastric area at the drain insertion site.  Vital signs: BP 116/76   Pulse 67   Temp 97.9 F (36.6 C) (Oral)   Ht 6' (1.829 m)   Wt 146 lb (66.2 kg)   BMI 19.80 kg/m    Physical Exam: Constitutional: No acute distress Abdomen: Soft, nondistended, with no tenderness to palpation at the incisions.  He only has some mild tenderness at the drain insertion site.  Drain with bilious fluid in the bag.  Assessment/Plan: This is a 39 y.o. male s/p robotic assisted cholecystectomy.  -Patient is healing well from the surgical standpoint.  Incisions are doing well with no complications. -Patient scheduled for repeat ERCP with Dr. Allen Norris on 11/30. -Patient has a activity restriction of no heavy lifting or pushing of no more than 10 to 15 pounds for a total of 4 weeks. -Follow-up as needed.   Melvyn Neth, [facility]

## 2020-02-10 LAB — FERRITIN: Ferritin: 279 ng/mL (ref 30–400)

## 2020-02-13 ENCOUNTER — Other Ambulatory Visit: Payer: Self-pay

## 2020-02-13 ENCOUNTER — Other Ambulatory Visit
Admission: RE | Admit: 2020-02-13 | Discharge: 2020-02-13 | Disposition: A | Discharge status: Home / Self Care | Payer: Medicaid Other | Source: Ambulatory Visit | Attending: Gastroenterology | Admitting: Gastroenterology

## 2020-02-13 DIAGNOSIS — Z01812 Encounter for preprocedural laboratory examination: Secondary | ICD-10-CM | POA: Diagnosis not present

## 2020-02-13 DIAGNOSIS — Z20822 Contact with and (suspected) exposure to covid-19: Secondary | ICD-10-CM | POA: Insufficient documentation

## 2020-02-14 LAB — SARS CORONAVIRUS 2 (TAT 6-24 HRS): SARS Coronavirus 2: NEGATIVE

## 2020-02-17 ENCOUNTER — Ambulatory Visit: Payer: Medicaid Other

## 2020-02-17 ENCOUNTER — Ambulatory Visit: Payer: Medicaid Other | Admitting: Certified Registered"

## 2020-02-17 ENCOUNTER — Ambulatory Visit
Admission: RE | Admit: 2020-02-17 | Discharge: 2020-02-17 | Disposition: A | Discharge status: Home / Self Care | Payer: Medicaid Other | Attending: Gastroenterology | Admitting: Gastroenterology

## 2020-02-17 ENCOUNTER — Encounter: Payer: Self-pay | Admitting: Gastroenterology

## 2020-02-17 ENCOUNTER — Encounter
Admission: RE | Disposition: A | Discharge status: Home / Self Care | Payer: Self-pay | Source: Home / Self Care | Attending: Gastroenterology

## 2020-02-17 DIAGNOSIS — F259 Schizoaffective disorder, unspecified: Secondary | ICD-10-CM | POA: Insufficient documentation

## 2020-02-17 DIAGNOSIS — N200 Calculus of kidney: Secondary | ICD-10-CM | POA: Diagnosis not present

## 2020-02-17 DIAGNOSIS — F17210 Nicotine dependence, cigarettes, uncomplicated: Secondary | ICD-10-CM | POA: Diagnosis not present

## 2020-02-17 DIAGNOSIS — Z9049 Acquired absence of other specified parts of digestive tract: Secondary | ICD-10-CM | POA: Diagnosis not present

## 2020-02-17 DIAGNOSIS — Z833 Family history of diabetes mellitus: Secondary | ICD-10-CM | POA: Insufficient documentation

## 2020-02-17 DIAGNOSIS — K8050 Calculus of bile duct without cholangitis or cholecystitis without obstruction: Secondary | ICD-10-CM

## 2020-02-17 DIAGNOSIS — Z882 Allergy status to sulfonamides status: Secondary | ICD-10-CM | POA: Insufficient documentation

## 2020-02-17 DIAGNOSIS — K8067 Calculus of gallbladder and bile duct with acute and chronic cholecystitis with obstruction: Secondary | ICD-10-CM | POA: Diagnosis present

## 2020-02-17 DIAGNOSIS — R932 Abnormal findings on diagnostic imaging of liver and biliary tract: Secondary | ICD-10-CM

## 2020-02-17 DIAGNOSIS — Z8249 Family history of ischemic heart disease and other diseases of the circulatory system: Secondary | ICD-10-CM | POA: Diagnosis not present

## 2020-02-17 HISTORY — PX: ERCP: SHX5425

## 2020-02-17 SURGERY — ERCP, WITH INTERVENTION IF INDICATED
Anesthesia: General

## 2020-02-17 MED ORDER — PHENYLEPHRINE HCL (PRESSORS) 10 MG/ML IV SOLN
INTRAVENOUS | Status: AC
  Filled 2020-02-17: qty 1

## 2020-02-17 MED ORDER — LACTATED RINGERS IV SOLN: INTRAVENOUS | Status: DC

## 2020-02-17 MED ORDER — FENTANYL CITRATE (PF) 100 MCG/2ML IJ SOLN
INTRAMUSCULAR | Status: AC
  Filled 2020-02-17: qty 2

## 2020-02-17 MED ORDER — MIDAZOLAM HCL 2 MG/2ML IJ SOLN
INTRAMUSCULAR | Status: AC
  Filled 2020-02-17: qty 2

## 2020-02-17 MED ORDER — VASOPRESSIN 20 UNIT/ML IV SOLN
INTRAVENOUS | Status: DC | PRN
  Administered 2020-02-17 (×2): 2 [IU] via INTRAVENOUS

## 2020-02-17 MED ORDER — MIDAZOLAM HCL 2 MG/2ML IJ SOLN
INTRAMUSCULAR | Status: DC | PRN
  Administered 2020-02-17: 2 mg via INTRAVENOUS

## 2020-02-17 MED ORDER — VASOPRESSIN 20 UNIT/ML IV SOLN
INTRAVENOUS | Status: AC
  Filled 2020-02-17: qty 1

## 2020-02-17 MED ORDER — PHENYLEPHRINE HCL (PRESSORS) 10 MG/ML IV SOLN
INTRAVENOUS | Status: DC | PRN
  Administered 2020-02-17 (×4): 100 ug via INTRAVENOUS

## 2020-02-17 MED ORDER — INDOMETHACIN 50 MG RE SUPP
RECTAL | Status: AC
  Filled 2020-02-17: qty 2

## 2020-02-17 MED ORDER — SUCCINYLCHOLINE CHLORIDE 200 MG/10ML IV SOSY
PREFILLED_SYRINGE | INTRAVENOUS | Status: AC
  Filled 2020-02-17: qty 10

## 2020-02-17 MED ORDER — GLYCOPYRROLATE 0.2 MG/ML IJ SOLN
INTRAMUSCULAR | Status: DC | PRN
  Administered 2020-02-17: .2 mg via INTRAVENOUS

## 2020-02-17 MED ORDER — GLYCOPYRROLATE 0.2 MG/ML IJ SOLN
INTRAMUSCULAR | Status: AC
  Filled 2020-02-17: qty 5

## 2020-02-17 MED ORDER — DEXAMETHASONE SODIUM PHOSPHATE 10 MG/ML IJ SOLN
INTRAMUSCULAR | Status: AC
  Filled 2020-02-17: qty 1

## 2020-02-17 MED ORDER — INDOMETHACIN 50 MG RE SUPP
100.0000 mg | Freq: Once | RECTAL | Status: AC
  Administered 2020-02-17: 100 mg via RECTAL

## 2020-02-17 MED ORDER — PROPOFOL 500 MG/50ML IV EMUL
INTRAVENOUS | Status: DC | PRN
  Administered 2020-02-17: 155 ug/kg/min via INTRAVENOUS

## 2020-02-17 MED ORDER — FENTANYL CITRATE (PF) 100 MCG/2ML IJ SOLN
INTRAMUSCULAR | Status: DC | PRN
  Administered 2020-02-17: 25 ug via INTRAVENOUS
  Administered 2020-02-17: 50 ug via INTRAVENOUS

## 2020-02-17 MED ORDER — PROPOFOL 10 MG/ML IV BOLUS
INTRAVENOUS | Status: DC | PRN
  Administered 2020-02-17 (×2): 10 mg via INTRAVENOUS
  Administered 2020-02-17: 40 mg via INTRAVENOUS

## 2020-02-17 MED ORDER — ONDANSETRON HCL 4 MG/2ML IJ SOLN
INTRAMUSCULAR | Status: AC
  Filled 2020-02-17: qty 2

## 2020-02-17 MED ORDER — LIDOCAINE HCL (PF) 2 % IJ SOLN
INTRAMUSCULAR | Status: AC
  Filled 2020-02-17: qty 30

## 2020-02-17 MED ORDER — LIDOCAINE HCL (CARDIAC) PF 100 MG/5ML IV SOSY
PREFILLED_SYRINGE | INTRAVENOUS | Status: DC | PRN
  Administered 2020-02-17: 100 mg via INTRAVENOUS

## 2020-02-17 NOTE — Op Note (Addendum)
[facility] Gastroenterology Patient Name: Manuel Horn Procedure Date: 02/17/2020 10:14 AM MRN: [id number] Account #: [id number] Date of Birth: 1980-12-08 Admit Type: Outpatient Age: 39 Room: [facility] ROOM 4 Gender: Male Note Status: Finalized Procedure:             ERCP Indications:           Common bile duct stone(s) Providers:             Midge Minium MD, MD Referring MD:          Lyndon Code, MD (Referring MD) Medicines:             Propofol per Anesthesia Complications:         No immediate complications. Procedure:             Pre-Anesthesia Assessment:                        - Prior to the procedure, a History and Physical was                         performed, and patient medications and allergies were                         reviewed. The patient's tolerance of previous                         anesthesia was also reviewed. The risks and benefits                         of the procedure and the sedation options and risks                         were discussed with the patient. All questions were                         answered, and informed consent was obtained. Prior                         Anticoagulants: The patient has taken no previous                         anticoagulant or antiplatelet agents. ASA Grade                         Assessment: II - A patient with mild systemic disease.                         After reviewing the risks and benefits, the patient                         was deemed in satisfactory condition to undergo the                         procedure.                        After obtaining informed consent, the scope was passed  under direct vision. Throughout the procedure, the                         patient's blood pressure, pulse, and oxygen                         saturations were monitored continuously. The was                         introduced through the mouth, and used to inject                          contrast into and used to inject contrast into the                         dorsal pancreatic duct. The ERCP was accomplished                         without difficulty. The patient tolerated the                         procedure well. Findings:      A biliary stent was visible on the scout film. A major papilla precut       sphincterotomy had been performed. The bile duct was deeply cannulated       with the short-nosed traction sphincterotome. Contrast was injected. I       personally interpreted the bile duct images. There was brisk flow of       contrast through the ducts. Image quality was excellent. Contrast       extended to the entire biliary tree. The main bile duct contained       filling defect(s) thought to be a stone. A wire was passed into the       biliary tree. The biliary tree was swept with a 15 mm balloon starting       at the bifurcation. Four stones were removed. No stones remained. Impression:            - Prior major papilla sphincterotomy                        - A filling defect consistent with a stone was seen on                         the cholangiogram.                        - Choledocholithiasis was found. Complete removal was                         accomplished by balloon extraction.                        - The biliary tree was swept.                        - Percutaneous drain removed. Recommendation:        - Discharge patient to home.                        -  Resume previous diet.                        - Continue present medications. Procedure Code(s):     --- Professional ---                        [phone], Endoscopic retrograde cholangiopancreatography                         (ERCP); with removal of calculi/debris from                         biliary/pancreatic duct(s)                        [phone], Endoscopic catheterization of the biliary                         ductal system, radiological supervision and                          interpretation Diagnosis Code(s):     --- Professional ---                        K80.50, Calculus of bile duct without cholangitis or                         cholecystitis without obstruction                        R93.2, Abnormal findings on diagnostic imaging of                         liver and biliary tract CPT copyright 2019 American Medical Association. All rights reserved. The codes documented in this report are preliminary and upon coder review may  be revised to meet current compliance requirements. Midge Minium MD, MD 02/17/2020 10:55:26 AM This report has been signed electronically. Number of Addenda: 0 Note Initiated On: 02/17/2020 10:14 AM Estimated Blood Loss:  Estimated blood loss: none.      [facility]

## 2020-02-17 NOTE — Anesthesia Preprocedure Evaluation (Signed)
Anesthesia Evaluation  Patient identified by MRN, date of birth, ID band Patient awake    Reviewed: Allergy & Precautions, NPO status , Patient's Chart, lab work & pertinent test results  History of Anesthesia Complications Negative for: history of anesthetic complications  Airway Mallampati: II       Dental  (+) Dental Advidsory Given, Poor Dentition   Pulmonary neg sleep apnea, neg COPD, Current Smoker and Patient abstained from smoking.,           Cardiovascular (-) hypertension(-) Past MI and (-) CHF (-) dysrhythmias (-) Valvular Problems/Murmurs     Neuro/Psych neg Seizures PSYCHIATRIC DISORDERS Schizophrenia    GI/Hepatic Neg liver ROS, neg GERD  ,  Endo/Other  neg diabetes  Renal/GU negative Renal ROS     Musculoskeletal   Abdominal   Peds  Hematology   Anesthesia Other Findings Past Medical History: No date: Schizoaffective disorder (HCC)   Reproductive/Obstetrics                             Anesthesia Physical  Anesthesia Plan  ASA: II  Anesthesia Plan: General   Post-op Pain Management:    Induction: Intravenous  PONV Risk Score and Plan: 1 and Propofol infusion and TIVA  Airway Management Planned: Natural Airway and Nasal Cannula  Additional Equipment:   Intra-op Plan:   Post-operative Plan:   Informed Consent: I have reviewed the patients History and Physical, chart, labs and discussed the procedure including the risks, benefits and alternatives for the proposed anesthesia with the patient or authorized representative who has indicated his/her understanding and acceptance.       Plan Discussed with:   Anesthesia Plan Comments:         Anesthesia Quick Evaluation

## 2020-02-17 NOTE — Transfer of Care (Signed)
`  Immediate Anesthesia Transfer of Care Note  Patient: Manuel Horn  Procedure(s) Performed: ENDOSCOPIC RETROGRADE CHOLANGIOPANCREATOGRAPHY (ERCP) (N/A )  Patient Location: Endoscopy Unit  Anesthesia Type:General  Level of Consciousness: drowsy and responds to stimulation  Airway & Oxygen Therapy: Patient Spontanous Breathing and Patient connected to face mask oxygen  Post-op Assessment: Report given to RN and Post -op Vital signs reviewed and stable  Post vital signs: Reviewed and stable  Last Vitals:  Vitals Value Taken Time  BP 111/75 02/17/20 1051  Temp    Pulse 62 02/17/20 1052  Resp 14 02/17/20 1052  SpO2 100 % 02/17/20 1052  Vitals shown include unvalidated device data.  Last Pain:  Vitals:   02/17/20 1051  TempSrc:   PainSc: 0-No pain         Complications: No complications documented.

## 2020-02-17 NOTE — Anesthesia Procedure Notes (Signed)
Procedure Name: General with mask airway Performed by: Mohammed Kindle, CRNA Pre-anesthesia Checklist: Patient identified, Emergency Drugs available and Patient being monitored Patient Re-evaluated:Patient Re-evaluated prior to induction Oxygen Delivery Method: Simple face mask Induction Type: IV induction Placement Confirmation: CO2 detector and positive ETCO2 Dental Injury: Teeth and Oropharynx as per pre-operative assessment

## 2020-02-17 NOTE — Anesthesia Postprocedure Evaluation (Signed)
Anesthesia Post Note  Patient: Manuel Horn  Procedure(s) Performed: ENDOSCOPIC RETROGRADE CHOLANGIOPANCREATOGRAPHY (ERCP) (N/A )  Patient location during evaluation: Endoscopy Anesthesia Type: General Level of consciousness: awake and alert Pain management: pain level controlled Vital Signs Assessment: post-procedure vital signs reviewed and stable Respiratory status: spontaneous breathing, nonlabored ventilation, respiratory function stable and patient connected to nasal cannula oxygen Cardiovascular status: blood pressure returned to baseline and stable Postop Assessment: no apparent nausea or vomiting Anesthetic complications: no   No complications documented.   Last Vitals:  Vitals:   02/17/20 1101 02/17/20 1111  BP: 110/79 98/78  Pulse: (!) 56 (!) 107  Resp: 12 19  Temp:    SpO2: 100% 100%    Last Pain:  Vitals:   02/17/20 1111  TempSrc:   PainSc: 0-No pain                 Lenard Simmer

## 2020-02-17 NOTE — Interval H&P Note (Signed)
Midge Minium, MD [facility] [address], [address] [place], [place] [address] [phone] Fax : [phone]  Primary Care Physician:  Lyndon Code, MD Primary Gastroenterologist:  Dr. Servando Snare  Pre-Procedure History & Physical: HPI:  Manuel Horn is a 39 y.o. male is here for an ERCP.   Past Medical History:  Diagnosis Date   Schizoaffective disorder [facility])     Past Surgical History:  Procedure Laterality Date   CHOLECYSTECTOMY     ENDOSCOPIC RETROGRADE CHOLANGIOPANCREATOGRAPHY (ERCP) WITH PROPOFOL N/A 01/20/2020   Procedure: ENDOSCOPIC RETROGRADE CHOLANGIOPANCREATOGRAPHY (ERCP) WITH PROPOFOL;  Surgeon: Midge Minium, MD;  Location: [facility] ENDOSCOPY;  Service: Endoscopy;  Laterality: N/A;   IR PERCUTANEOUS TRANSHEPATIC CHOLANGIOGRAM  01/21/2020   LEG SURGERY Left     Prior to Admission medications   Medication Sig Start Date End Date Taking? Authorizing Provider  oxyCODONE (OXY IR/ROXICODONE) 5 MG immediate release tablet Take 1 tablet (5 mg total) by mouth every 6 (six) hours as needed for moderate pain or severe pain. Patient not taking: Reported on 02/09/2020 01/25/20   Tresa Moore, MD  paliperidone (INVEGA SUSTENNA) 234 MG/1.5ML SUSY injection Inject into the muscle.    [provider]    Allergies as of 01/27/2020 - Review Complete 01/22/2020  Allergen Reaction Noted   Sulfa antibiotics Rash 11/12/2013    Family History  Problem Relation Age of Onset   Diabetes Mother    Hypertension Mother     Social History   Socioeconomic History   Marital status: Single    Spouse name: Not on file   Number of children: 0   Years of education: Not on file   Highest education level: Not on file  Occupational History   Occupation: disability  Tobacco Use   Smoking status: Current Every Day Smoker    Packs/day: 2.00    Types: Cigarettes   Smokeless tobacco: Never Used  Vaping Use   Vaping Use: Never used  Substance and Sexual Activity   Alcohol use: No    Drug use: No   Sexual activity: Not on file  Other Topics Concern   Not on file  Social History Narrative   Not on file   Social Determinants of Health   Financial Resource Strain:    Difficulty of Paying Living Expenses: Not on file  Food Insecurity:    Worried About Running Out of Food in the Last Year: Not on file   [employer] of Food in the Last Year: Not on file  Transportation Needs:    Lack of Transportation (Medical): Not on file   Lack of Transportation (Non-Medical): Not on file  Physical Activity:    Days of Exercise per Week: Not on file   Minutes of Exercise per Session: Not on file  Stress:    Feeling of Stress : Not on file  Social Connections:    Frequency of Communication with Friends and Family: Not on file   Frequency of Social Gatherings with Friends and Family: Not on file   Attends Religious Services: Not on file   Active Member of Clubs or Organizations: Not on file   Attends Banker Meetings: Not on file   Marital Status: Not on file  Intimate Partner Violence:    Fear of Current or Ex-Partner: Not on file   Emotionally Abused: Not on file   Physically Abused: Not on file   Sexually Abused: Not on file    Review of Systems: See HPI, otherwise negative ROS  Physical Exam: BP 91/67   Pulse 77   Temp (!) 97 F (36.1 C) (Temporal)   Resp 16   Ht 6' (1.829 m)   Wt 66.2 kg   SpO2 100%   BMI 19.79 kg/m  General:   Alert,  pleasant and cooperative in NAD Head:  Normocephalic and atraumatic. Neck:  Supple; no masses or thyromegaly. Lungs:  Clear throughout to auscultation.    Heart:  Regular rate and rhythm. Abdomen:  Soft, nontender and nondistended. Normal bowel sounds, without guarding, and without rebound.   Neurologic:  Alert and  oriented x4;  grossly normal neurologically.  Impression/Plan: Manuel Horn is here for an ERCP to be performed for CBD stone removal.  Risks, benefits, limitations, and alternatives regarding  ERCP  have been reviewed with the patient.  Questions have been answered.  All parties agreeable.   Midge Minium, MD  02/17/2020, 9:54 AM

## 2020-02-18 ENCOUNTER — Encounter: Payer: Self-pay | Admitting: Gastroenterology

## 2020-04-06 ENCOUNTER — Ambulatory Visit: Payer: Medicaid Other | Admitting: Internal Medicine

## 2020-04-08 ENCOUNTER — Ambulatory Visit: Payer: Medicaid Other | Admitting: Physician Assistant

## 2020-05-18 ENCOUNTER — Ambulatory Visit (INDEPENDENT_AMBULATORY_CARE_PROVIDER_SITE_OTHER): Payer: Medicaid Other | Admitting: Hospice and Palliative Medicine

## 2020-05-18 ENCOUNTER — Encounter: Payer: Self-pay | Admitting: Hospice and Palliative Medicine

## 2020-05-18 VITALS — BP 119/64 | HR 84 | Temp 97.2°F | Resp 16 | Ht 72.0 in | Wt 166.0 lb

## 2020-05-18 DIAGNOSIS — F259 Schizoaffective disorder, unspecified: Secondary | ICD-10-CM | POA: Diagnosis not present

## 2020-05-18 DIAGNOSIS — R7989 Other specified abnormal findings of blood chemistry: Secondary | ICD-10-CM

## 2020-05-18 NOTE — Progress Notes (Signed)
[facility] [address] [place], [place] [address]  Internal MEDICINE  Office Visit Note  Patient Name: Manuel Horn  [id number]  [id number]  Date of Service: 05/18/2020  Chief Complaint  Patient presents with  . Follow-up    HPI Patient is here for routine follow-up Had repeat labs to monitor his ferritin levels Initially elevated--likely due to acute process Soon after initial elevation had cholecystectomy due to obstructive gallstones and acute jaundice Ferritin levels have since returned to normal Has fully recovered from surgery, appetite back to normal, no issues with elimination  No issues or acute complaints today    Current Medication: Outpatient Encounter Medications as of 05/18/2020  Medication Sig  . paliperidone (INVEGA SUSTENNA) 234 MG/1.5ML SUSY injection Inject into the muscle.  . [DISCONTINUED] oxyCODONE (OXY IR/ROXICODONE) 5 MG immediate release tablet Take 1 tablet (5 mg total) by mouth every 6 (six) hours as needed for moderate pain or severe pain. (Patient not taking: Reported on 02/09/2020)   No facility-administered encounter medications on file as of 05/18/2020.    Surgical History: Past Surgical History:  Procedure Laterality Date  . CHOLECYSTECTOMY    . ENDOSCOPIC RETROGRADE CHOLANGIOPANCREATOGRAPHY (ERCP) WITH PROPOFOL N/A 01/20/2020   Procedure: ENDOSCOPIC RETROGRADE CHOLANGIOPANCREATOGRAPHY (ERCP) WITH PROPOFOL;  Surgeon: Midge Minium, MD;  Location: [facility] ENDOSCOPY;  Service: Endoscopy;  Laterality: N/A;  . ERCP N/A 02/17/2020   Procedure: ENDOSCOPIC RETROGRADE CHOLANGIOPANCREATOGRAPHY (ERCP);  Surgeon: Midge Minium, MD;  Location: [facility] ENDOSCOPY;  Service: Endoscopy;  Laterality: N/A;  . IR PERCUTANEOUS TRANSHEPATIC CHOLANGIOGRAM  01/21/2020  . LEG SURGERY Left     Medical History: Past Medical History:  Diagnosis Date  . Schizoaffective disorder (HCC)     Family History: Family History  Problem Relation Age of Onset  .  Diabetes Mother   . Hypertension Mother     Social History   Socioeconomic History  . Marital status: Single    Spouse name: Not on file  . Number of children: 0  . Years of education: Not on file  . Highest education level: Not on file  Occupational History  . Occupation: disability  Tobacco Use  . Smoking status: Current Every Day Smoker    Packs/day: 2.00    Types: Cigarettes  . Smokeless tobacco: Never Used  Vaping Use  . Vaping Use: Never used  Substance and Sexual Activity  . Alcohol use: No  . Drug use: No  . Sexual activity: Not on file  Other Topics Concern  . Not on file  Social History Narrative  . Not on file   Social Determinants of Health   Financial Resource Strain: Not on file  Food Insecurity: Not on file  Transportation Needs: Not on file  Physical Activity: Not on file  Stress: Not on file  Social Connections: Not on file  Intimate Partner Violence: Not on file      Review of Systems  Constitutional: Negative for chills, fatigue and unexpected weight change.  HENT: Negative for congestion, postnasal drip, rhinorrhea, sneezing and sore throat.   Eyes: Negative for redness.  Respiratory: Negative for cough, chest tightness and shortness of breath.   Cardiovascular: Negative for chest pain and palpitations.  Gastrointestinal: Negative for abdominal pain, constipation, diarrhea, nausea and vomiting.  Genitourinary: Negative for dysuria and frequency.  Musculoskeletal: Negative for arthralgias, back pain, joint swelling and neck pain.  Skin: Negative for rash.  Neurological: Negative for tremors and numbness.  Hematological: Negative for adenopathy. Does not bruise/bleed easily.  Psychiatric/Behavioral:  Negative for behavioral problems (Depression), sleep disturbance and suicidal ideas. The patient is not nervous/anxious.     Vital Signs: BP 119/64   Pulse 84   Temp (!) 97.2 F (36.2 C)   Resp 16   Ht 6' (1.829 m)   Wt 166 lb (75.3 kg)    SpO2 98%   BMI 22.51 kg/m    Physical Exam Vitals reviewed.  Constitutional:      Appearance: Normal appearance. He is normal weight.  Cardiovascular:     Rate and Rhythm: Normal rate and regular rhythm.     Pulses: Normal pulses.     Heart sounds: Normal heart sounds.  Pulmonary:     Effort: Pulmonary effort is normal.     Breath sounds: Normal breath sounds.  Abdominal:     General: Abdomen is flat.     Palpations: Abdomen is soft.  Musculoskeletal:        General: Normal range of motion.     Cervical back: Normal range of motion.  Skin:    General: Skin is warm.  Neurological:     General: No focal deficit present.     Mental Status: He is alert and oriented to person, place, and time. Mental status is at baseline.  Psychiatric:        Mood and Affect: Mood normal.        Behavior: Behavior normal.        Thought Content: Thought content normal.        Judgment: Judgment normal.    Assessment/Plan: 1. Elevated ferritin Likely acute response due to illness Levels have returned to normal  2. Schizoaffective disorder, unspecified type (HCC) Stable, followed by psychiatry  General Counseling: javyn havlin understanding of the findings of todays visit and agrees with plan of treatment. I have discussed any further diagnostic evaluation that may be needed or ordered today. We also reviewed his medications today. he has been encouraged to call the office with any questions or concerns that should arise related to todays visit.   Time spent: 30 Minutes Time spent includes review of chart, medications, test results and follow-up plan with the patient.  This patient was seen by Leeanne Deed AGNP-C in Collaboration with Dr Lyndon Code as a part of collaborative care agreement     Lubertha Basque. Temple Sporer AGNP-C Internal medicine

## 2020-05-19 ENCOUNTER — Encounter: Payer: Self-pay | Admitting: Hospice and Palliative Medicine

## 2020-09-29 ENCOUNTER — Telehealth: Payer: Self-pay

## 2020-09-29 NOTE — Telephone Encounter (Signed)
Spoke with patient and advised him of appt time change for his annual CPE on 11-15-20. Org time was 3:30 has now been moved to 3:00. Toni Amend

## 2020-11-12 ENCOUNTER — Telehealth: Payer: Self-pay

## 2020-11-12 NOTE — Telephone Encounter (Signed)
Left vm to confirm 11/15/20 appointment-Toni 

## 2020-11-15 ENCOUNTER — Other Ambulatory Visit: Payer: Self-pay

## 2020-11-15 ENCOUNTER — Encounter (INDEPENDENT_AMBULATORY_CARE_PROVIDER_SITE_OTHER): Payer: Self-pay

## 2020-11-15 ENCOUNTER — Encounter: Payer: Self-pay | Admitting: Nurse Practitioner

## 2020-11-15 ENCOUNTER — Ambulatory Visit: Payer: Medicaid Other | Admitting: Nurse Practitioner

## 2020-11-15 VITALS — BP 106/76 | HR 75 | Temp 97.1°F | Resp 16 | Ht 72.0 in | Wt 160.0 lb

## 2020-11-15 DIAGNOSIS — R300 Dysuria: Secondary | ICD-10-CM

## 2020-11-15 DIAGNOSIS — R748 Abnormal levels of other serum enzymes: Secondary | ICD-10-CM | POA: Diagnosis not present

## 2020-11-15 DIAGNOSIS — F259 Schizoaffective disorder, unspecified: Secondary | ICD-10-CM

## 2020-11-15 DIAGNOSIS — D538 Other specified nutritional anemias: Secondary | ICD-10-CM | POA: Diagnosis not present

## 2020-11-15 DIAGNOSIS — Z0001 Encounter for general adult medical examination with abnormal findings: Secondary | ICD-10-CM | POA: Diagnosis not present

## 2020-11-15 DIAGNOSIS — R7303 Prediabetes: Secondary | ICD-10-CM

## 2020-11-15 DIAGNOSIS — R17 Unspecified jaundice: Secondary | ICD-10-CM

## 2020-11-15 NOTE — Progress Notes (Signed)
[facility] [place], [place] [address]  Internal MEDICINE  Office Visit Note  Patient Name: Manuel Horn  [id number]  [id number]  Date of Service: 11/15/2020  Chief Complaint  Patient presents with   Annual Exam    HPI [name] presents for an annual well visit and physical exam. he has a history of schizoaffective disorder, prediabetes and elevated liver enzymes with jaundice. He has had one dose of the janssen COVID vaccine. He lives with family and does not work full time due to disability. He is only on 1 medication and it is a long-acting injectable for schizoaffective disorder. He denies any pain. His mother is present for the office visit and is concerned about the jaundice today. The patient and family have no other concerns today  He is followed by psychiatry.     Current Medication: Outpatient Encounter Medications as of 11/15/2020  Medication Sig   paliperidone (INVEGA SUSTENNA) 234 MG/1.5ML SUSY injection Inject into the muscle.   [DISCONTINUED] pneumococcal 20-Val Conj Vacc (PREVNAR 20) 0.5 ML injection Inject 0.5 mLs into the muscle tomorrow at 10 am.   [DISCONTINUED] oxyCODONE (OXY IR/ROXICODONE) 5 MG immediate release tablet Take 1 tablet (5 mg total) by mouth every 6 (six) hours as needed for moderate pain or severe pain. (Patient not taking: Reported on 02/09/2020)   No facility-administered encounter medications on file as of 11/15/2020.    Surgical History: Past Surgical History:  Procedure Laterality Date   CHOLECYSTECTOMY     ENDOSCOPIC RETROGRADE CHOLANGIOPANCREATOGRAPHY (ERCP) WITH PROPOFOL N/A 01/20/2020   Procedure: ENDOSCOPIC RETROGRADE CHOLANGIOPANCREATOGRAPHY (ERCP) WITH PROPOFOL;  Surgeon: Lucilla Lame, MD;  Location: [facility] ENDOSCOPY;  Service: Endoscopy;  Laterality: N/A;   ERCP N/A 02/17/2020   Procedure: ENDOSCOPIC RETROGRADE CHOLANGIOPANCREATOGRAPHY (ERCP);  Surgeon: Lucilla Lame, MD;  Location: [facility] ENDOSCOPY;  Service:  Endoscopy;  Laterality: N/A;   IR PERCUTANEOUS TRANSHEPATIC CHOLANGIOGRAM  01/21/2020   LEG SURGERY Left     Medical History: Past Medical History:  Diagnosis Date   Schizoaffective disorder ([place])     Family History: Family History  Problem Relation Age of Onset   Diabetes Mother    Hypertension Mother     Social History   Socioeconomic History   Marital status: Single    Spouse name: Not on file   Number of children: 0   Years of education: Not on file   Highest education level: Not on file  Occupational History   Occupation: disability  Tobacco Use   Smoking status: Every Day    Packs/day: 2.00    Types: Cigarettes   Smokeless tobacco: Never  Vaping Use   Vaping Use: Never used  Substance and Sexual Activity   Alcohol use: No   Drug use: No   Sexual activity: Not on file  Other Topics Concern   Not on file  Social History Narrative   Not on file   Social Determinants of Health   Financial Resource Strain: Not on file  Food Insecurity: Not on file  Transportation Needs: Not on file  Physical Activity: Not on file  Stress: Not on file  Social Connections: Not on file  Intimate Partner Violence: Not on file      Review of Systems  Constitutional:  Negative for activity change, appetite change, chills, fatigue, fever and unexpected weight change.  HENT: Negative.  Negative for congestion, ear pain, rhinorrhea, sore throat and trouble swallowing.   Eyes: Negative.   Respiratory: Negative.  Negative for cough, chest  tightness, shortness of breath and wheezing.   Cardiovascular: Negative.  Negative for chest pain.  Gastrointestinal: Negative.  Negative for abdominal pain, blood in stool, constipation, diarrhea, nausea and vomiting.  Endocrine: Negative.   Genitourinary: Negative.  Negative for difficulty urinating, dysuria, frequency, hematuria and urgency.  Musculoskeletal: Negative.  Negative for arthralgias, back pain, joint swelling, myalgias and neck  pain.  Skin: Negative.  Negative for rash and wound.  Allergic/Immunologic: Negative.  Negative for immunocompromised state.  Neurological: Negative.  Negative for dizziness, seizures, numbness and headaches.  Hematological: Negative.   Psychiatric/Behavioral: Negative.  Negative for behavioral problems, self-injury and suicidal ideas. The patient is not nervous/anxious.    Vital Signs: BP 106/76   Pulse 75   Temp (!) 97.1 F (36.2 C)   Resp 16   Ht 6' (1.829 m)   Wt 160 lb (72.6 kg)   SpO2 98%   BMI 21.70 kg/m    Physical Exam Vitals reviewed.  Constitutional:      General: He is awake. He is not in acute distress.    Appearance: Normal appearance. He is well-developed, well-groomed and normal weight. He is not ill-appearing or diaphoretic.  HENT:     Head: Normocephalic and atraumatic.     Right Ear: Tympanic membrane, ear canal and external ear normal.     Left Ear: Tympanic membrane, ear canal and external ear normal.     Nose: Nose normal. No congestion or rhinorrhea.     Mouth/Throat:     Lips: Pink.     Mouth: Mucous membranes are moist.     Pharynx: Oropharynx is clear. Uvula midline. No oropharyngeal exudate or posterior oropharyngeal erythema.  Eyes:     General: Lids are normal. Vision grossly intact. Gaze aligned appropriately. Scleral icterus present.        Right eye: No discharge.        Left eye: No discharge.     Extraocular Movements: Extraocular movements intact.     Conjunctiva/sclera: Conjunctivae normal.     Pupils: Pupils are equal, round, and reactive to light.     Funduscopic exam:    Right eye: Red reflex present.        Left eye: Red reflex present. Neck:     Thyroid: No thyromegaly.     Vascular: No carotid bruit or JVD.     Trachea: Trachea and phonation normal. No tracheal deviation.  Cardiovascular:     Rate and Rhythm: Normal rate and regular rhythm.     Pulses:          Carotid pulses are 3+ on the right side and 3+ on the left  side.      Radial pulses are 2+ on the right side and 2+ on the left side.       Dorsalis pedis pulses are 2+ on the right side and 2+ on the left side.       Posterior tibial pulses are 2+ on the right side and 2+ on the left side.     Heart sounds: Normal heart sounds, S1 normal and S2 normal. No murmur heard.   No friction rub. No gallop.  Pulmonary:     Effort: Pulmonary effort is normal. No accessory muscle usage or respiratory distress.     Breath sounds: Normal breath sounds and air entry. No stridor. No wheezing or rales.  Chest:     Chest wall: No tenderness.  Abdominal:     General: Bowel sounds are normal. There is  no distension.     Palpations: Abdomen is soft. There is no shifting dullness, fluid wave, mass or pulsatile mass.     Tenderness: There is no abdominal tenderness. There is no guarding or rebound.  Musculoskeletal:        General: No tenderness or deformity. Normal range of motion.     Cervical back: Normal range of motion and neck supple.     Right lower leg: No edema.     Left lower leg: No edema.  Lymphadenopathy:     Cervical: No cervical adenopathy.  Skin:    General: Skin is warm and dry.     Capillary Refill: Capillary refill takes less than 2 seconds.     Coloration: Skin is not pale.     Findings: No erythema or rash.  Neurological:     Mental Status: He is alert and oriented to person, place, and time.     Cranial Nerves: No cranial nerve deficit.     Motor: No abnormal muscle tone.     Coordination: Coordination normal.     Gait: Gait normal.     Deep Tendon Reflexes: Reflexes are normal and symmetric.  Psychiatric:        Mood and Affect: Mood and affect normal.        Behavior: Behavior normal. Behavior is cooperative.        Thought Content: Thought content normal.        Judgment: Judgment normal.       Assessment/Plan: 1. Encounter for routine adult health examination with abnormal findings Age-appropriate preventive screenings and  vaccinations discussed, annual physical exam completed. Routine labs for health maintenance ordered, see below. PHM updated. He is not due for any preventive screenings at this time.   2. Pre-diabetes Routine labs ordered, currently controlled by diet and lifestyle modifications.  - CMP14+EGFR - Lipid Profile - TSH + free T4  3. Elevated liver enzymes Persistent elevated liver enzymes, scleral icterus noted on exam, abdominal ultrasound and labs ordered.  - US Abdomen Complete; Future - Fe+TIBC+Fer  4. Other specified nutritional anemias Routine labs ordered, has history of anemia.  - CBC with Differential/Platelet - Vitamin D (25 hydroxy)  5. Schizoaffective disorder, unspecified type ([place]) Stable, is on invega sustenna deconate injection which is working well at controlling his symptoms. Per patient and mother's report.   6. Dysuria Routine urinalysis done.  - UA/M w/rflx Culture, Routine - Microscopic Examination - Urine Culture, Reflex      General Counseling: Jediah verbalizes understanding of the findings of todays visit and agrees with plan of treatment. I have discussed any further diagnostic evaluation that may be needed or ordered today. We also reviewed his medications today. he has been encouraged to call the office with any questions or concerns that should arise related to todays visit.    Orders Placed This Encounter  Procedures   Microscopic Examination   Urine Culture, Reflex   US Abdomen Complete   UA/M w/rflx Culture, Routine   CMP14+EGFR   CBC with Differential/Platelet   Lipid Profile   TSH + free T4   Vitamin D (25 hydroxy)   Fe+TIBC+Fer    No orders of the defined types were placed in this encounter.   Return in about 4 weeks (around 12/13/2020) for F/U, U/S @ [facility], Review labs/test, Charmane Protzman PCP.   Total time spent:30 Minutes Time spent includes review of chart, medications, test results, and follow up plan with the patient.   [place]  Controlled  Substance Database was reviewed by me.  This patient was seen by Jonetta Osgood, FNP-C in collaboration with Dr. Clayborn Bigness as a part of collaborative care agreement.  Yeva Bissette R. Valetta Fuller, MSN, FNP-C Internal medicine

## 2020-11-17 LAB — IRON,TIBC AND FERRITIN PANEL
Ferritin: 89 ng/mL (ref 30–400)
Iron Saturation: 37 % (ref 15–55)
Iron: 99 ug/dL (ref 38–169)
Total Iron Binding Capacity: 265 ug/dL (ref 250–450)
UIBC: 166 ug/dL (ref 111–343)

## 2020-11-17 LAB — CMP14+EGFR
ALT: 7 IU/L (ref 0–44)
AST: 17 IU/L (ref 0–40)
Albumin/Globulin Ratio: 1.5 (ref 1.2–2.2)
Albumin: 4.1 g/dL (ref 4.0–5.0)
Alkaline Phosphatase: 81 IU/L (ref 44–121)
BUN/Creatinine Ratio: 6 — ABNORMAL LOW (ref 9–20)
BUN: 7 mg/dL (ref 6–24)
Bilirubin Total: 0.4 mg/dL (ref 0.0–1.2)
CO2: 24 mmol/L (ref 20–29)
Calcium: 9.1 mg/dL (ref 8.7–10.2)
Chloride: 100 mmol/L (ref 96–106)
Creatinine, Ser: 1.12 mg/dL (ref 0.76–1.27)
Globulin, Total: 2.7 g/dL (ref 1.5–4.5)
Glucose: 87 mg/dL (ref 65–99)
Potassium: 3.6 mmol/L (ref 3.5–5.2)
Sodium: 137 mmol/L (ref 134–144)
Total Protein: 6.8 g/dL (ref 6.0–8.5)
eGFR: 85 mL/min/{1.73_m2} (ref >59)

## 2020-11-17 LAB — LIPID PANEL
Chol/HDL Ratio: 3.7 ratio (ref 0.0–5.0)
Cholesterol, Total: 132 mg/dL (ref 100–199)
HDL: 36 mg/dL — ABNORMAL LOW (ref >39)
LDL Chol Calc (NIH): 83 mg/dL (ref 0–99)
Triglycerides: 60 mg/dL (ref 0–149)
VLDL Cholesterol Cal: 13 mg/dL (ref 5–40)

## 2020-11-17 LAB — CBC WITH DIFFERENTIAL/PLATELET
Basophils Absolute: 0 10*3/uL (ref 0.0–0.2)
Basos: 0 %
EOS (ABSOLUTE): 0.1 10*3/uL (ref 0.0–0.4)
Eos: 1 %
Hematocrit: 34.7 % — ABNORMAL LOW (ref 37.5–51.0)
Hemoglobin: 12.4 g/dL — ABNORMAL LOW (ref 13.0–17.7)
Immature Grans (Abs): 0 10*3/uL (ref 0.0–0.1)
Immature Granulocytes: 0 %
Lymphocytes Absolute: 3.8 10*3/uL — ABNORMAL HIGH (ref 0.7–3.1)
Lymphs: 36 %
MCH: 34.3 pg — ABNORMAL HIGH (ref 26.6–33.0)
MCHC: 35.7 g/dL (ref 31.5–35.7)
MCV: 96 fL (ref 79–97)
Monocytes Absolute: 0.5 10*3/uL (ref 0.1–0.9)
Monocytes: 4 %
Neutrophils Absolute: 6.2 10*3/uL (ref 1.4–7.0)
Neutrophils: 59 %
Platelets: 198 10*3/uL (ref 150–450)
RBC: 3.61 x10E6/uL — ABNORMAL LOW (ref 4.14–5.80)
RDW: 11.9 % (ref 11.6–15.4)
WBC: 10.6 10*3/uL (ref 3.4–10.8)

## 2020-11-17 LAB — TSH+FREE T4
Free T4: 1.15 ng/dL (ref 0.82–1.77)
TSH: 2.78 u[IU]/mL (ref 0.450–4.500)

## 2020-11-17 LAB — VITAMIN D 25 HYDROXY (VIT D DEFICIENCY, FRACTURES): Vit D, 25-Hydroxy: 25.4 ng/mL — ABNORMAL LOW (ref 30.0–100.0)

## 2020-11-18 LAB — UA/M W/RFLX CULTURE, ROUTINE
Bilirubin, UA: NEGATIVE
Glucose, UA: NEGATIVE
Nitrite, UA: NEGATIVE
Specific Gravity, UA: 1.022 (ref 1.005–1.030)
Urobilinogen, Ur: 1 mg/dL (ref 0.2–1.0)
pH, UA: 6 (ref 5.0–7.5)

## 2020-11-18 LAB — MICROSCOPIC EXAMINATION
Bacteria, UA: NONE SEEN
Casts: NONE SEEN /lpf
WBC, UA: >30 /hpf — AB (ref 0–5)

## 2020-11-18 LAB — URINE CULTURE, REFLEX

## 2020-12-01 ENCOUNTER — Ambulatory Visit: Payer: Medicaid Other

## 2020-12-01 ENCOUNTER — Other Ambulatory Visit: Payer: Self-pay

## 2020-12-01 DIAGNOSIS — R748 Abnormal levels of other serum enzymes: Secondary | ICD-10-CM | POA: Diagnosis not present

## 2020-12-14 ENCOUNTER — Ambulatory Visit: Payer: Medicaid Other | Admitting: Nurse Practitioner

## 2020-12-14 ENCOUNTER — Other Ambulatory Visit: Payer: Self-pay

## 2020-12-14 ENCOUNTER — Encounter: Payer: Self-pay | Admitting: Nurse Practitioner

## 2020-12-14 VITALS — BP 111/73 | HR 75 | Temp 98.7°F | Resp 16 | Ht 72.0 in | Wt 163.2 lb

## 2020-12-14 DIAGNOSIS — Z23 Encounter for immunization: Secondary | ICD-10-CM | POA: Diagnosis not present

## 2020-12-14 DIAGNOSIS — R748 Abnormal levels of other serum enzymes: Secondary | ICD-10-CM

## 2020-12-14 NOTE — Progress Notes (Signed)
[facility] [address] [place], [place] [address]  Internal MEDICINE  Office Visit Note  Patient Name: Manuel Horn  [id number]  [id number]  Date of Service: 12/14/2020  Chief Complaint  Patient presents with   Follow-up    Review [place]    HPI Meko presents for a follow up visit to discuss ultrasound results. He is accompanied by his father for the office visit. He had an abdominal ultrasound performed due to scleral icterus and chronically elevated liver enzymes. A complex bowel pattern was identified in the epigastric area near morrison's pouch in the mesentery and was found indeterminate for definitive mass effect. Abdominal CT recommended if symptomatic or follow up ultrasound in 3 months if no acute clinical findings.    Current Medication: Outpatient Encounter Medications as of 12/14/2020  Medication Sig   paliperidone (INVEGA SUSTENNA) 234 MG/1.5ML SUSY injection Inject into the muscle.   No facility-administered encounter medications on file as of 12/14/2020.    Surgical History: Past Surgical History:  Procedure Laterality Date   CHOLECYSTECTOMY     ENDOSCOPIC RETROGRADE CHOLANGIOPANCREATOGRAPHY (ERCP) WITH PROPOFOL N/A 01/20/2020   Procedure: ENDOSCOPIC RETROGRADE CHOLANGIOPANCREATOGRAPHY (ERCP) WITH PROPOFOL;  Surgeon: Midge Minium, MD;  Location: [facility] ENDOSCOPY;  Service: Endoscopy;  Laterality: N/A;   ERCP N/A 02/17/2020   Procedure: ENDOSCOPIC RETROGRADE CHOLANGIOPANCREATOGRAPHY (ERCP);  Surgeon: Midge Minium, MD;  Location: [facility] ENDOSCOPY;  Service: Endoscopy;  Laterality: N/A;   IR PERCUTANEOUS TRANSHEPATIC CHOLANGIOGRAM  01/21/2020   LEG SURGERY Left     Medical History: Past Medical History:  Diagnosis Date   Schizoaffective disorder (HCC)     Family History: Family History  Problem Relation Age of Onset   Diabetes Mother    Hypertension Mother     Social History   Socioeconomic History   Marital status: Single    Spouse name: Not on  file   Number of children: 0   Years of education: Not on file   Highest education level: Not on file  Occupational History   Occupation: disability  Tobacco Use   Smoking status: Every Day    Packs/day: 2.00    Types: Cigarettes   Smokeless tobacco: Never  Vaping Use   Vaping Use: Never used  Substance and Sexual Activity   Alcohol use: No   Drug use: No   Sexual activity: Not on file  Other Topics Concern   Not on file  Social History Narrative   Not on file   Social Determinants of Health   Financial Resource Strain: Not on file  Food Insecurity: Not on file  Transportation Needs: Not on file  Physical Activity: Not on file  Stress: Not on file  Social Connections: Not on file  Intimate Partner Violence: Not on file      Review of Systems  Constitutional:  Negative for chills, fatigue and unexpected weight change.  HENT:  Negative for congestion, rhinorrhea, sneezing and sore throat.   Eyes:  Negative for redness.  Respiratory: Negative.  Negative for cough, chest tightness and shortness of breath.   Cardiovascular: Negative.  Negative for chest pain and palpitations.  Gastrointestinal: Negative.  Negative for abdominal pain, constipation, diarrhea, nausea and vomiting.  Genitourinary:  Negative for dysuria and frequency.  Musculoskeletal:  Negative for arthralgias, back pain, joint swelling and neck pain.  Skin:  Negative for rash.  Neurological: Negative.  Negative for tremors and numbness.  Hematological:  Negative for adenopathy. Does not bruise/bleed easily.  Psychiatric/Behavioral:  Negative for behavioral problems (  Depression), sleep disturbance and suicidal ideas. The patient is not nervous/anxious.    Vital Signs: BP 111/73   Pulse 75   Temp 98.7 F (37.1 C)   Resp 16   Ht 6' (1.829 m)   Wt 163 lb 3.2 oz (74 kg)   SpO2 99%   BMI 22.13 kg/m    Physical Exam Vitals reviewed.  Constitutional:      General: He is not in acute distress.     Appearance: Normal appearance. He is normal weight. He is not ill-appearing.  HENT:     Head: Normocephalic and atraumatic.  Eyes:     Extraocular Movements: Extraocular movements intact.     Pupils: Pupils are equal, round, and reactive to light.  Cardiovascular:     Rate and Rhythm: Normal rate and regular rhythm.  Pulmonary:     Effort: Pulmonary effort is normal. No respiratory distress.  Neurological:     Mental Status: He is alert and oriented to person, place, and time.     Cranial Nerves: No cranial nerve deficit.     Coordination: Coordination normal.     Gait: Gait normal.  Psychiatric:        Mood and Affect: Mood normal.        Behavior: Behavior normal.       Assessment/Plan: 1. Elevated liver enzymes Will consider CT abdomen or repeat abdominal ultrasound in 3 months due to nonspecific ultrasound findings.   2. Needs flu shot Administered in office today - Flu Vaccine MDCK QUAD PF   General Counseling: [place] verbalizes understanding of the findings of todays visit and agrees with plan of treatment. I have discussed any further diagnostic evaluation that may be needed or ordered today. We also reviewed his medications today. he has been encouraged to call the office with any questions or concerns that should arise related to todays visit.    Orders Placed This Encounter  Procedures   Flu Vaccine MDCK QUAD PF    No orders of the defined types were placed in this encounter.   Return in about 6 months (around 06/13/2021) for F/U, med refill, Dorna Mallet PCP.   Total time spent:20 Minutes Time spent includes review of chart, medications, test results, and follow up plan with the patient.   [place] Controlled Substance Database was reviewed by me.  This patient was seen by Sallyanne Kuster, FNP-C in collaboration with Dr. Beverely Risen as a part of collaborative care agreement.   Doran Nestle R. Tedd Sias, MSN, FNP-C Internal medicine

## 2021-03-24 IMAGING — CT CT RENAL STONE PROTOCOL
3 of 4 series · 8 of 46 positions shown, 15 images · non-contrast
Comparison: January 16, 2013

CLINICAL DATA: Pain and hematuria.  Nausea and vomiting

EXAM:
CT ABDOMEN AND PELVIS WITHOUT CONTRAST
TECHNIQUE: Multidetector CT imaging of the abdomen and pelvis was performed
following the standard protocol without oral or IV contrast.

[Series 4: lung bases · axial · 0.75mm/px · z∈[-131,-71]mm · 4 of 20 slices shown, 9 images]
[im 4/20  soft-tissue]
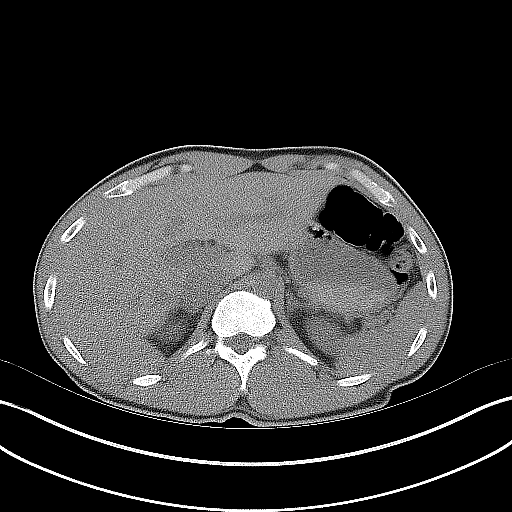
[im 4/20  lung]
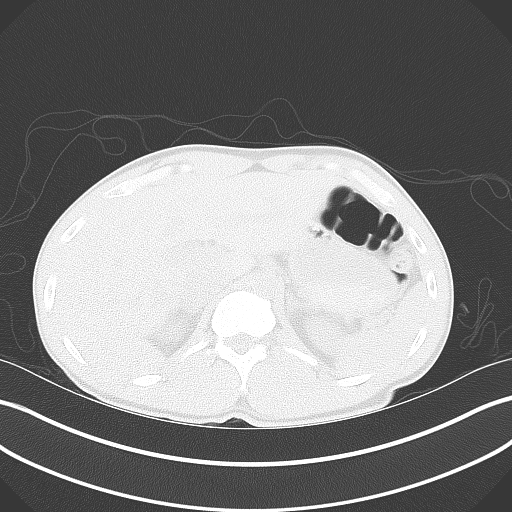
[im 4/20  bone]
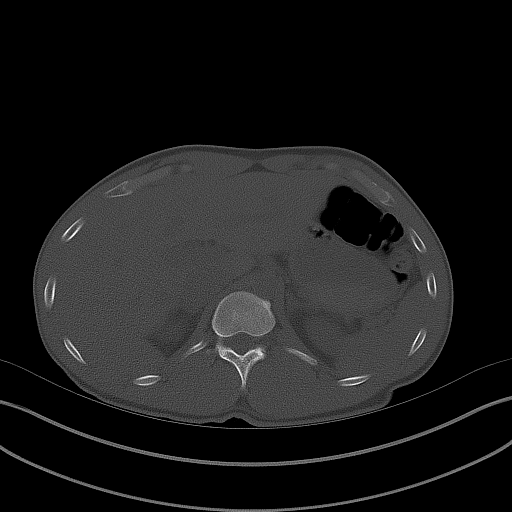
[im 8/20  soft-tissue]
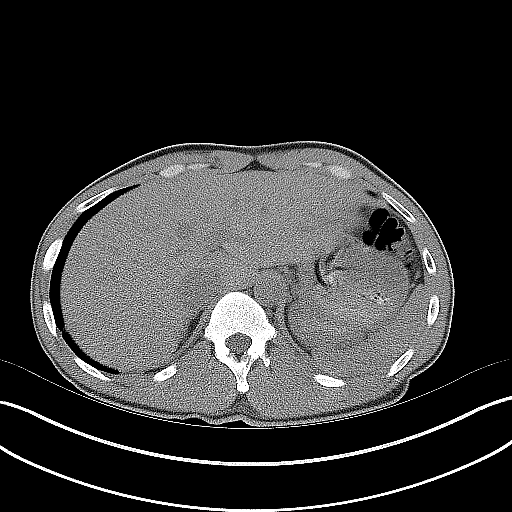
[im 8/20  lung]
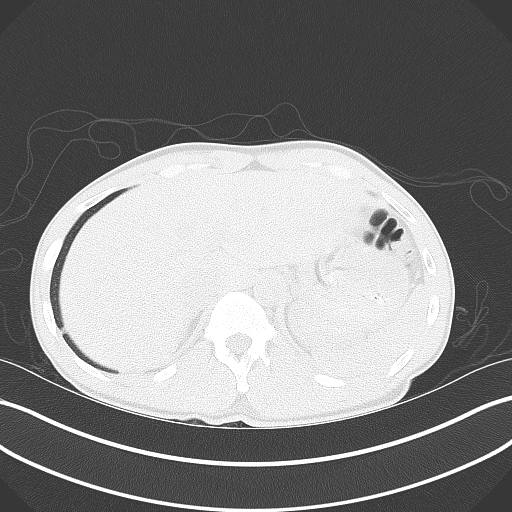
[im 12/20  soft-tissue]
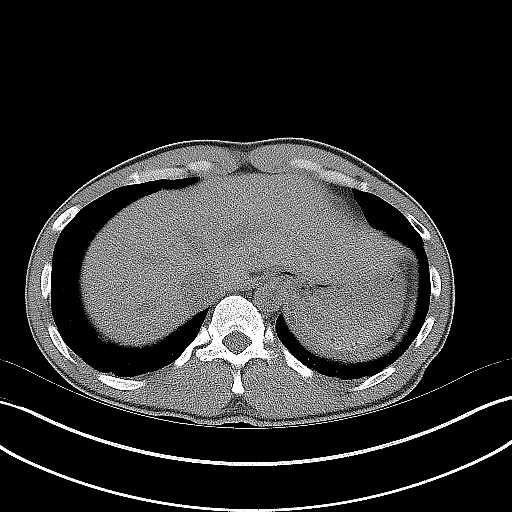
[im 12/20  lung]
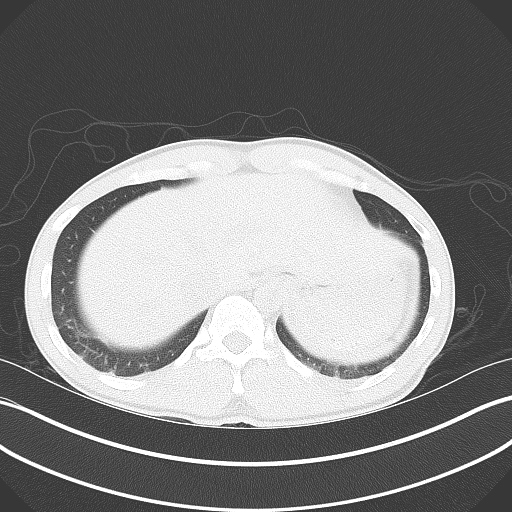
[im 16/20  soft-tissue]
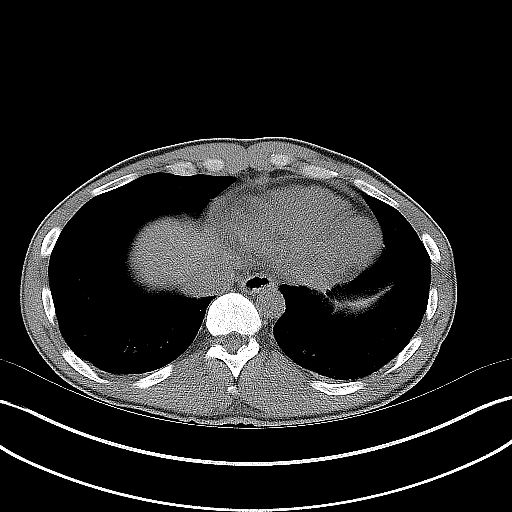
[im 16/20  lung]
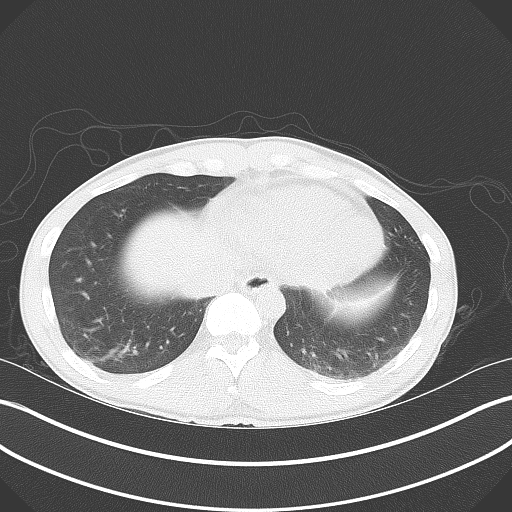

[Series 5: coronal · coronal · 0.73mm/px · 3 of 113 slices shown, 4 images]
[im 38/113  soft-tissue]
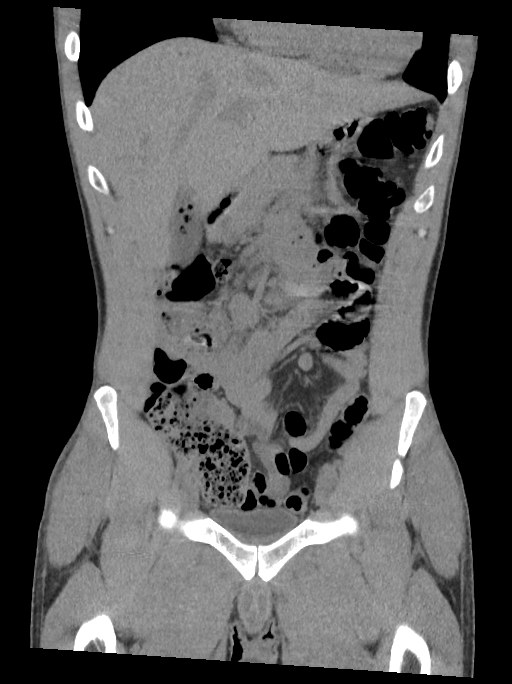
[im 50/113  soft-tissue]
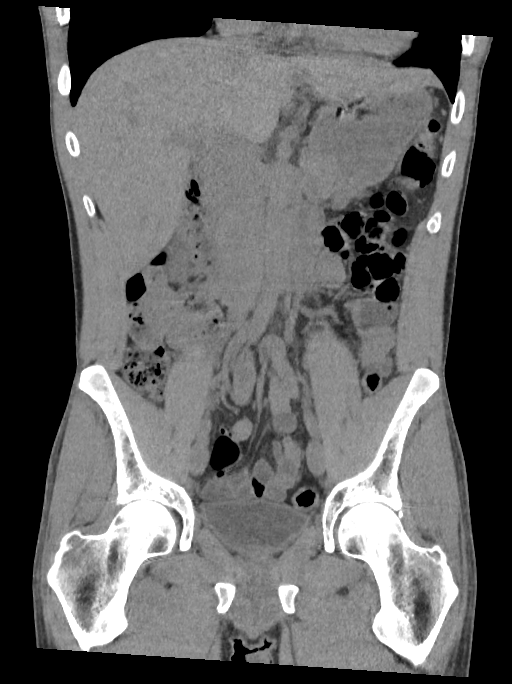
[im 50/113  bone]
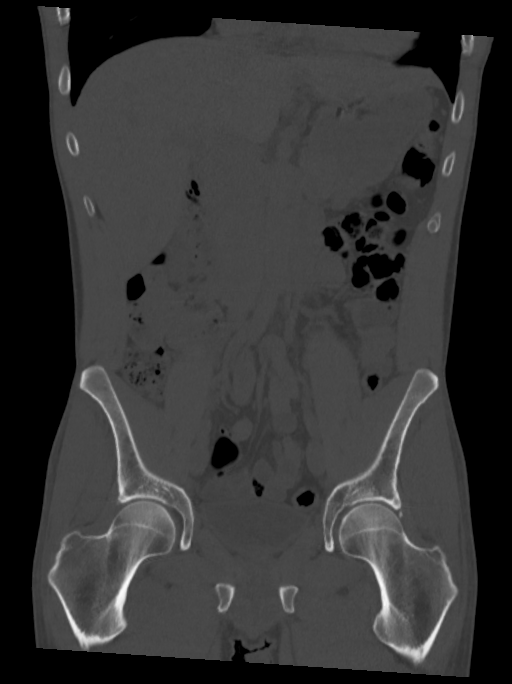
[im 63/113  soft-tissue]
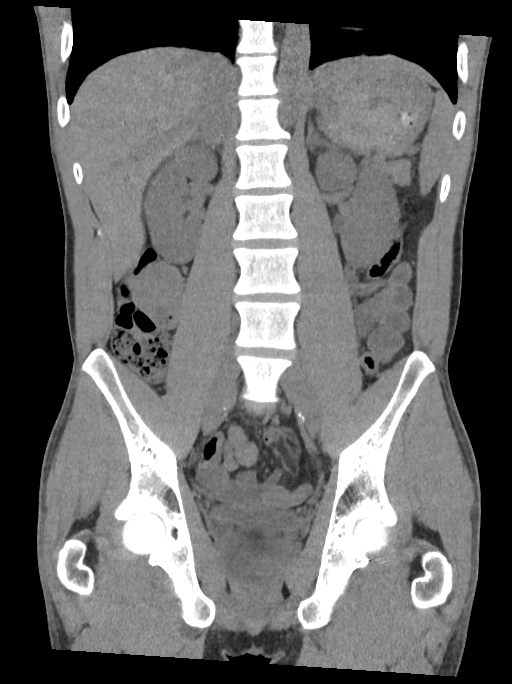

[Series 6: sagittal · sagittal · 0.50mm/px · 1 of 164 slices shown, 2 images]
[im 55/164  soft-tissue]
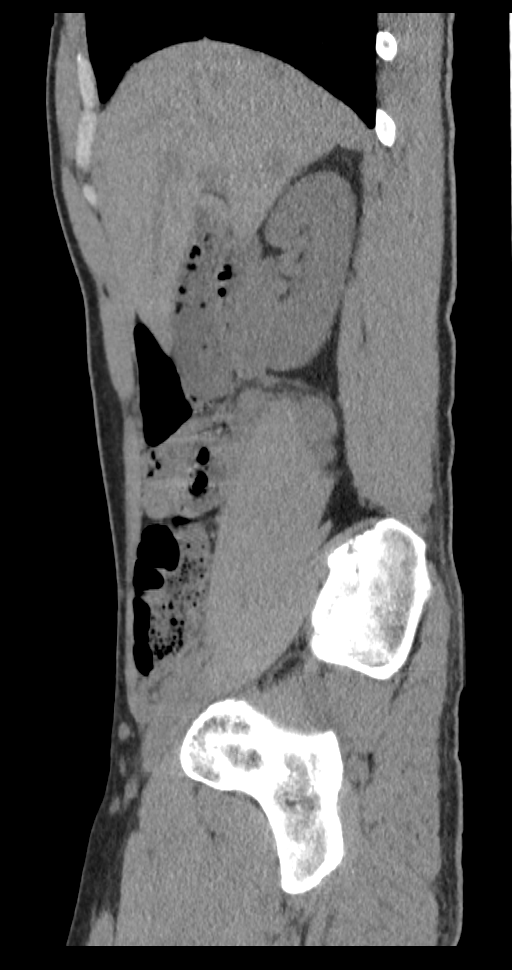
[im 55/164  bone]
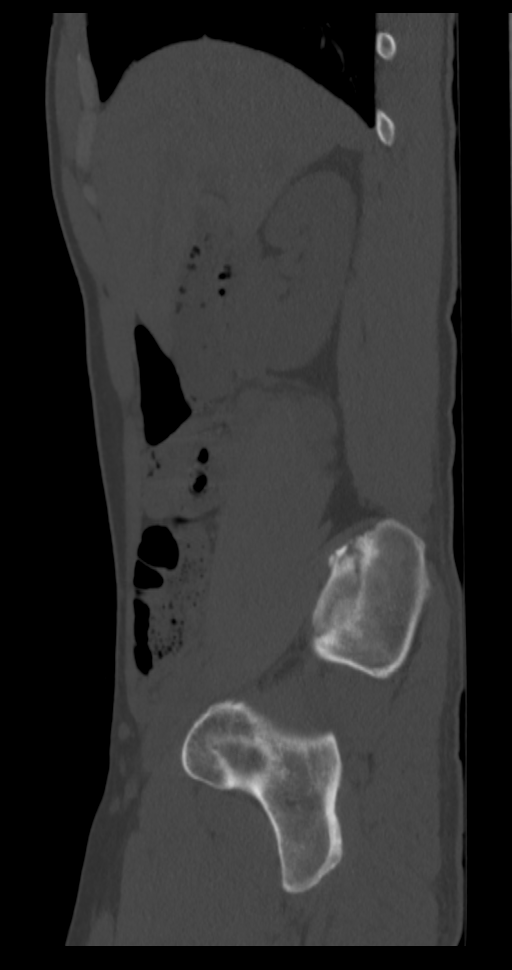

[8 of 46 positions shown; findings below may reference images not displayed]

FINDINGS: Lower chest: There is bibasilar atelectasis. There is no lung base
edema or consolidation.

Hepatobiliary: No focal liver lesions are evident on this
noncontrast enhanced study. There are nitrogen containing gallstones
within the gallbladder. The gallbladder wall by CT appears
borderline thickened. There is no evident biliary duct dilatation.

Pancreas: There is no pancreatic mass or inflammatory focus.

Spleen: No splenic lesions are evident.

Adrenals/Urinary Tract: Adrenals bilaterally appear normal. Kidneys
bilaterally show no evident mass or hydronephrosis on either side.
There is no appreciable renal or ureteral calculus on either side.
The urinary bladder is midline with wall thickness within normal
limits.

Stomach/Bowel: There is no appreciable bowel wall or mesenteric
thickening. There is no evident bowel obstruction. Terminal ileum
appears unremarkable. There is no free air or portal venous air.

Vascular/Lymphatic: There is no abdominal aortic aneurysm. There is
slight calcification in each common iliac artery. There is no
adenopathy in the abdomen or pelvis.

Reproductive: Prostate and seminal vesicles are normal in size and
contour. No evident pelvic mass.

Other: Appendix appears normal. There is no abscess or ascites in
the abdomen or pelvis.

Musculoskeletal: No blastic or lytic bone lesions are evident. There
is no intramuscular or abdominal wall lesion.
IMPRESSION: 1. Cholelithiasis. Question thickening of the gallbladder wall.
Advise correlation with ultrasound of the gallbladder to further
assess. No biliary duct dilatation evident.

2. No evident renal or ureteral calculus. No hydronephrosis. Urinary
bladder wall thickness within normal limits.

3. No bowel obstruction. No abscess in the abdomen or pelvis.
Appendix appears normal.

## 2021-03-24 IMAGING — US US ABDOMEN LIMITED
1 series · 14 of 25 positions shown · non-contrast
Comparison: Same-day CT

CLINICAL DATA: Cholelithiasis

EXAM:
ULTRASOUND ABDOMEN LIMITED RIGHT UPPER QUADRANT

[Series 1: us abdomen limited · 14 of 34 slices shown]
[im 1/34]
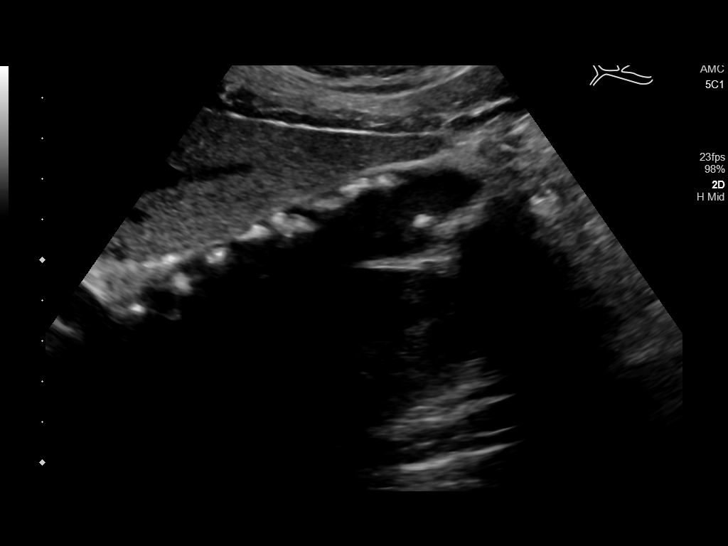
[im 3/34]
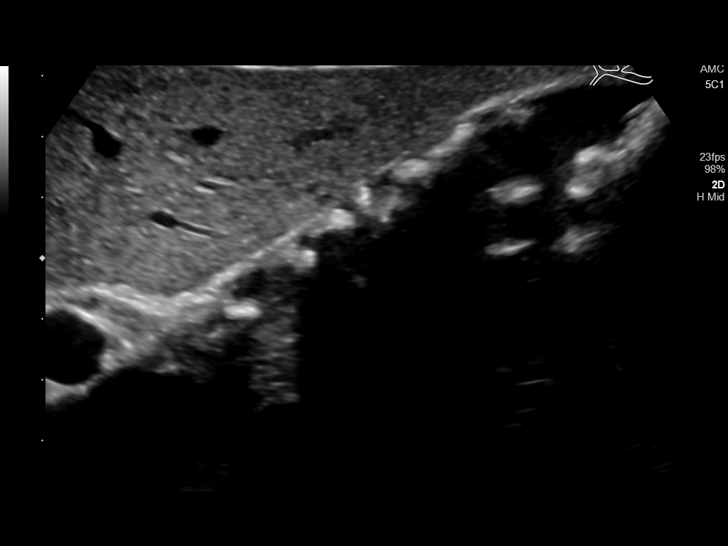
[im 6/34]
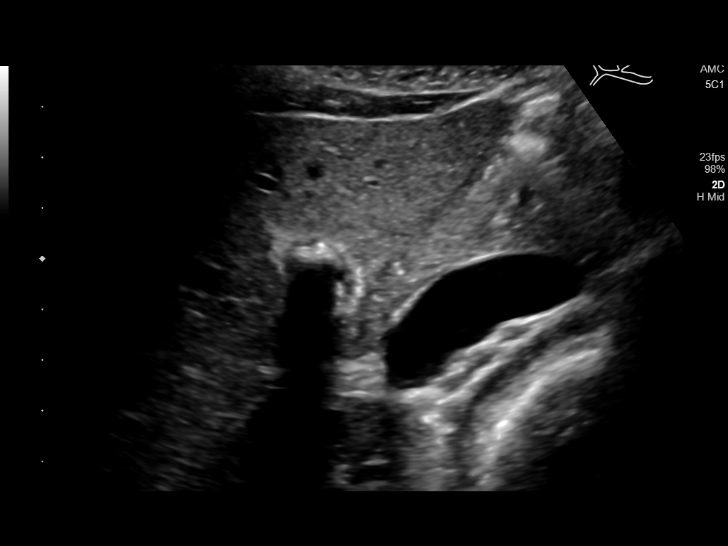
[im 9/34]
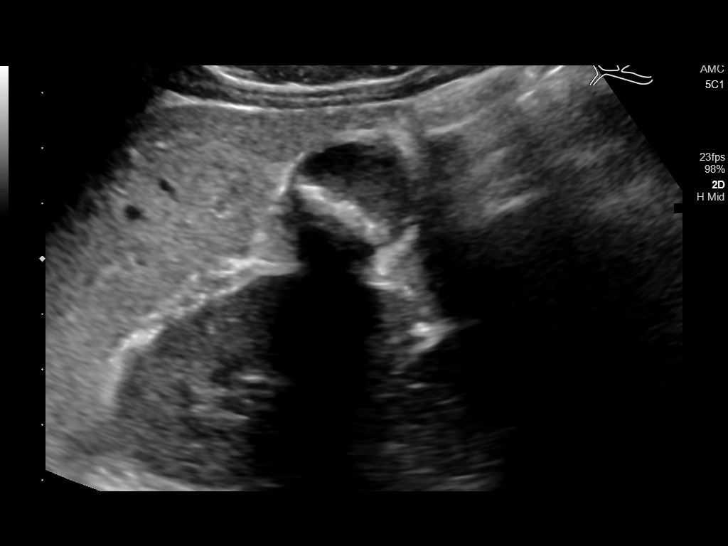
[im 12/34]
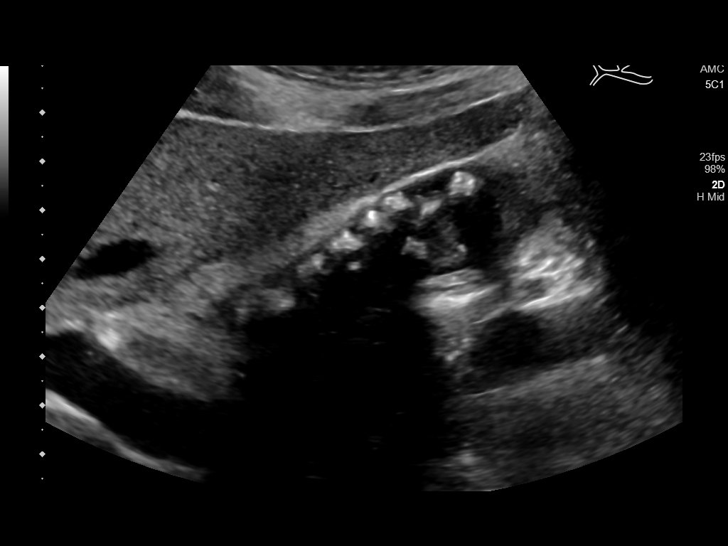
[im 13/34]
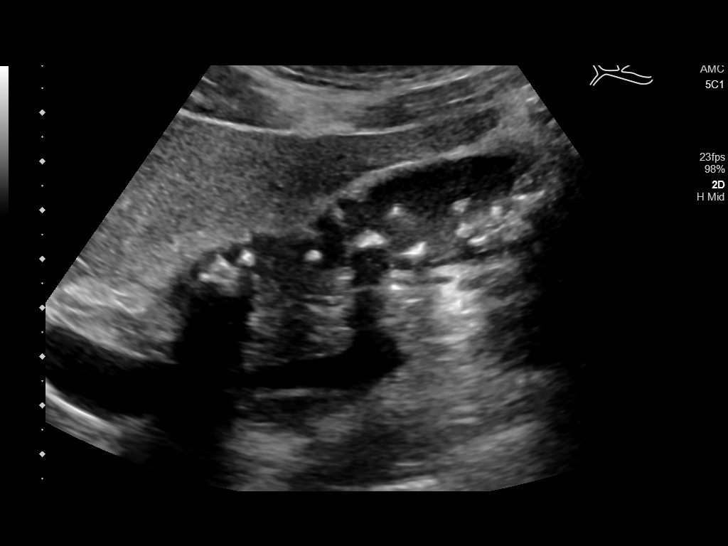
[im 16/34]
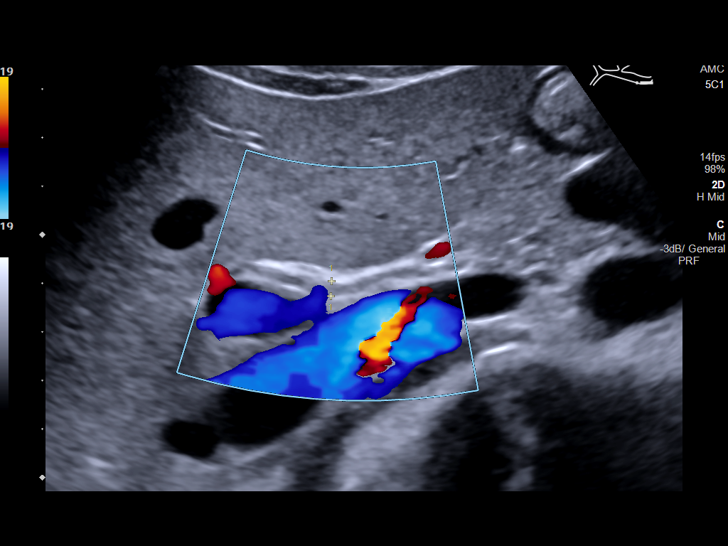
[im 18/34]
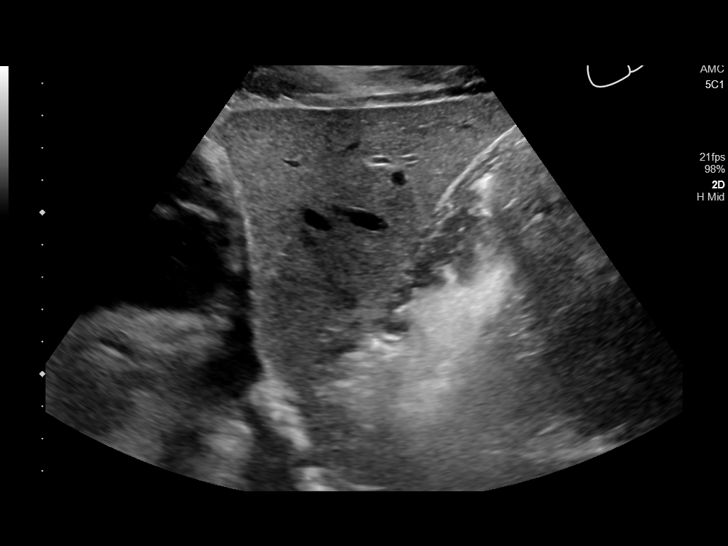
[im 21/34]
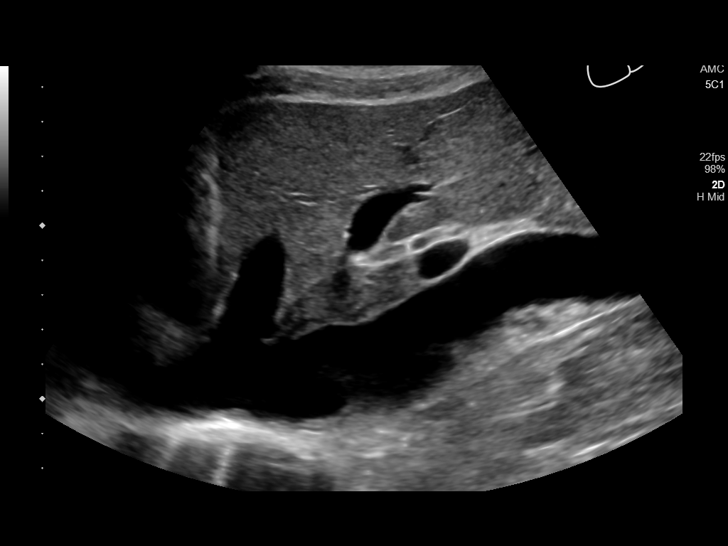
[im 23/34]
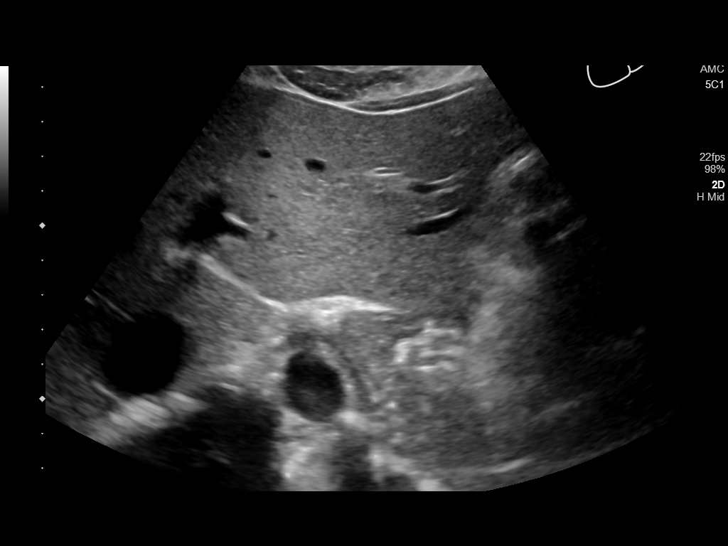
[im 25/34]
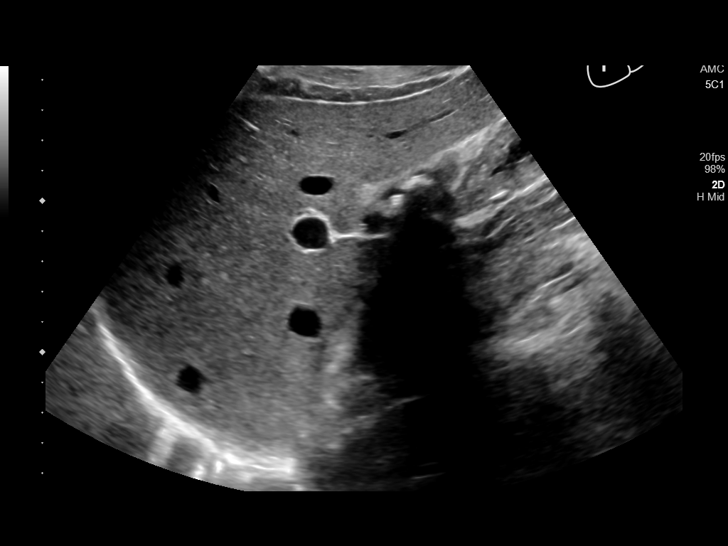
[im 28/34]
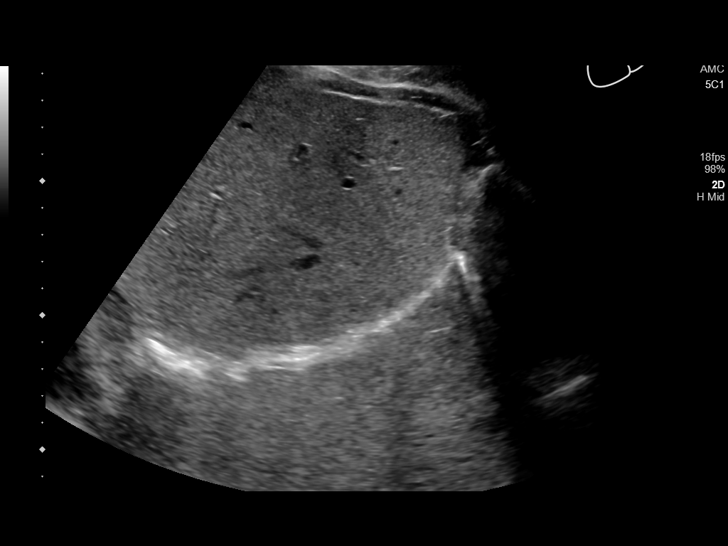
[im 31/34]
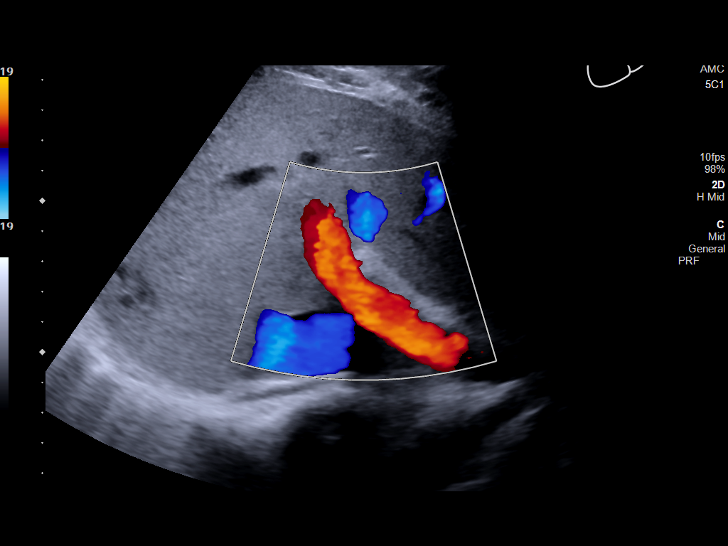
[im 34/34]
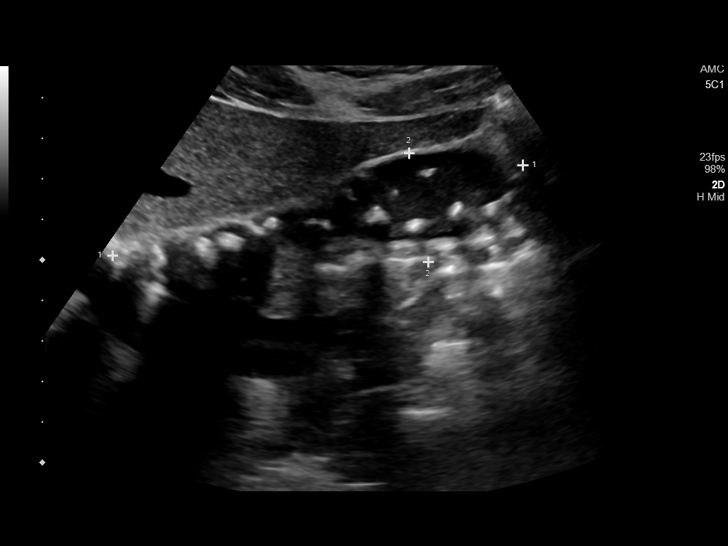

[14 of 25 positions shown; findings below may reference images not displayed]

FINDINGS: Gallbladder:

Gallbladder appears partially contracted around multiple echogenic,
posteriorly shadowing gallstones and biliary sludge with a
wall-echo-shadow sign. No visible gallbladder wall thickening or
pericholecystic fluid. Sonographic Murphy sign is reportedly
negative.

Common bile duct:

Diameter: 3.1 mm, nondilated

Liver:

No focal lesion identified. Within normal limits in parenchymal
echogenicity. Portal vein is patent on color Doppler imaging with
normal direction of blood flow towards the liver.

Other: None.
IMPRESSION: Cholelithiasis without sonographic features of acute cholecystitis

## 2021-06-07 ENCOUNTER — Other Ambulatory Visit: Payer: Self-pay

## 2021-06-07 ENCOUNTER — Encounter: Payer: Self-pay | Admitting: Nurse Practitioner

## 2021-06-07 ENCOUNTER — Ambulatory Visit: Payer: Medicaid Other | Admitting: Nurse Practitioner

## 2021-06-07 VITALS — BP 107/77 | HR 74 | Temp 98.0°F | Resp 16 | Ht 72.0 in | Wt 175.4 lb

## 2021-06-07 DIAGNOSIS — R748 Abnormal levels of other serum enzymes: Secondary | ICD-10-CM

## 2021-06-07 DIAGNOSIS — R7989 Other specified abnormal findings of blood chemistry: Secondary | ICD-10-CM

## 2021-06-07 DIAGNOSIS — F259 Schizoaffective disorder, unspecified: Secondary | ICD-10-CM

## 2021-06-07 NOTE — Progress Notes (Signed)
[facility] ?[address] ?[place],  [address] ? ?Internal MEDICINE  ?Office Visit Note ? ?Patient Name: Manuel Horn ? [id number]  ?[id number] ? ?Date of Service: 06/11/2021 ? ?Chief Complaint  ?Patient presents with  ? Follow-up  ? ? ?HPI ?Manuel Horn presents for a visit for routine follow up. He is accompanied by his mother.  He gets monthly Invega injections and has been on the 234 mg dose since he was 41 years old. He is stable and in good spirits. He has no specific concerns or complaints. He denies any abdominal pain or acute GI symptoms. His psychiatrist is leaving the practice and the office may be closing down so the patient is requesting to have invega injections at the clinic. We may be able to do 1 or 2 injections while getting him set up with a new psychiatric provider but will not be able to facilitate this request long term.  ? His current psychiatric provider is Manuel Horn.  ? ? ? ?Current Medication: ?Outpatient Encounter Medications as of 06/07/2021  ?Medication Sig  ? paliperidone (INVEGA SUSTENNA) 234 MG/1.5ML SUSY injection Inject into the muscle.  ? ?No facility-administered encounter medications on file as of 06/07/2021.  ? ? ?Surgical History: ?Past Surgical History:  ?Procedure Laterality Date  ? CHOLECYSTECTOMY    ? ENDOSCOPIC RETROGRADE CHOLANGIOPANCREATOGRAPHY (ERCP) WITH PROPOFOL N/A 01/20/2020  ? Procedure: ENDOSCOPIC RETROGRADE CHOLANGIOPANCREATOGRAPHY (ERCP) WITH PROPOFOL;  Surgeon: Lucilla Lame, MD;  Location: [facility] ENDOSCOPY;  Service: Endoscopy;  Laterality: N/A;  ? ERCP N/A 02/17/2020  ? Procedure: ENDOSCOPIC RETROGRADE CHOLANGIOPANCREATOGRAPHY (ERCP);  Surgeon: Lucilla Lame, MD;  Location: [facility] ENDOSCOPY;  Service: Endoscopy;  Laterality: N/A;  ? IR PERCUTANEOUS TRANSHEPATIC CHOLANGIOGRAM  01/21/2020  ? LEG SURGERY Left   ? ? ?Medical History: ?Past Medical History:  ?Diagnosis Date  ? Schizoaffective disorder ([place])   ? ? ?Family History: ?Family History  ?Problem Relation  Age of Onset  ? Diabetes Mother   ? Hypertension Mother   ? ? ?Social History  ? ?Socioeconomic History  ? Marital status: Single  ?  Spouse name: Not on file  ? Number of children: 0  ? Years of education: Not on file  ? Highest education level: Not on file  ?Occupational History  ? Occupation: disability  ?Tobacco Use  ? Smoking status: Every Day  ?  Packs/day: 2.00  ?  Types: Cigarettes  ? Smokeless tobacco: Never  ? Tobacco comments:  ?  1.5 packs a day  ?Vaping Use  ? Vaping Use: Never used  ?Substance and Sexual Activity  ? Alcohol use: No  ? Drug use: No  ? Sexual activity: Not on file  ?Other Topics Concern  ? Not on file  ?Social History Narrative  ? Not on file  ? ?Social Determinants of Health  ? ?Financial Resource Strain: Not on file  ?Food Insecurity: Not on file  ?Transportation Needs: Not on file  ?Physical Activity: Not on file  ?Stress: Not on file  ?Social Connections: Not on file  ?Intimate Partner Violence: Not on file  ? ? ? ? ?Review of Systems  ?Constitutional:  Negative for chills, fatigue and unexpected weight change.  ?HENT:  Negative for congestion, rhinorrhea, sneezing and sore throat.   ?Eyes:  Negative for redness.  ?Respiratory:  Negative for cough, chest tightness and shortness of breath.   ?Cardiovascular:  Negative for chest pain and palpitations.  ?Gastrointestinal:  Negative for abdominal pain, constipation, diarrhea, nausea and vomiting.  ?Genitourinary:  Negative  for dysuria and frequency.  ?Musculoskeletal:  Negative for arthralgias, back pain, joint swelling and neck pain.  ?Skin:  Negative for rash.  ?Neurological: Negative.  Negative for tremors and numbness.  ?Hematological:  Negative for adenopathy. Does not bruise/bleed easily.  ?Psychiatric/Behavioral:  Negative for behavioral problems (Depression), sleep disturbance and suicidal ideas. The patient is not nervous/anxious.   ? ?Vital Signs: ?BP 107/77   Pulse 74   Temp 98 ?F (36.7 ?C)   Resp 16   Ht 6' (1.829 m)    Wt 175 lb 6.4 oz (79.6 kg)   SpO2 98%   BMI 23.79 kg/m?  ? ? ?Physical Exam ?Vitals reviewed.  ?Constitutional:   ?   General: He is not in acute distress. ?   Appearance: Normal appearance. He is normal weight. He is not ill-appearing.  ?HENT:  ?   Head: Normocephalic and atraumatic.  ?Eyes:  ?   Pupils: Pupils are equal, round, and reactive to light.  ?Cardiovascular:  ?   Rate and Rhythm: Normal rate and regular rhythm.  ?Pulmonary:  ?   Effort: Pulmonary effort is normal. No respiratory distress.  ?Neurological:  ?   Mental Status: He is alert and oriented to person, place, and time.  ?Psychiatric:     ?   Mood and Affect: Mood normal.     ?   Behavior: Behavior normal.  ? ? ? ? ? ?Assessment/Plan: ?1. Elevated liver enzymes ?Repeat liver enzymes.  ?- Hepatic function panel ? ?2. Schizoaffective disorder, unspecified type ([place]) ?Referred to Dr. Shea Horn for urgent consult. He needs to get established and on a consistent schedule/routine for his monthly Invega injection. His injection dates keep changing on a month to month basis and it is not always given on time.  ?- Ambulatory referral to Psychiatry ? ?3. Elevated serum creatinine ?History of elevated creatinine level, repeat metabolic panel.  ?- Comprehensive Metabolic Panel (CMET) ? ? ?General Counseling: Manuel Horn understanding of the findings of todays visit and agrees with plan of treatment. I have discussed any further diagnostic evaluation that may be needed or ordered today. We also reviewed his medications today. he has been encouraged to call the office with any questions or concerns that should arise related to todays visit. ? ? ? ?Orders Placed This Encounter  ?Procedures  ? Hepatic function panel  ? Ambulatory referral to Psychiatry  ? ? ?No orders of the defined types were placed in this encounter. ? ? ?Return in about 6 months (around 12/08/2021) for previously scheduled, CPE, Manuel Horn PCP. ? ? ?Total time spent:30 Minutes ?Time spent  includes review of chart, medications, test results, and follow up plan with the patient.  ? ?[place] Controlled Substance Database was reviewed by me. ? ?This patient was seen by Jonetta Osgood, FNP-C in collaboration with Dr. Clayborn Bigness as a part of collaborative care agreement. ? ? ?Akram Kissick R. Valetta Fuller, MSN, FNP-C ?Internal medicine  ?

## 2021-06-08 LAB — HEPATIC FUNCTION PANEL
ALT: 11 IU/L (ref 0–44)
AST: 16 IU/L (ref 0–40)
Albumin: 4.4 g/dL (ref 4.0–5.0)
Alkaline Phosphatase: 85 IU/L (ref 44–121)
Bilirubin Total: 0.5 mg/dL (ref 0.0–1.2)
Bilirubin, Direct: 0.16 mg/dL (ref 0.00–0.40)
Total Protein: 7 g/dL (ref 6.0–8.5)

## 2021-06-11 ENCOUNTER — Encounter: Payer: Self-pay | Admitting: Nurse Practitioner

## 2021-06-14 ENCOUNTER — Ambulatory Visit: Payer: Medicaid Other | Admitting: Nurse Practitioner

## 2021-06-15 ENCOUNTER — Telehealth: Payer: Self-pay

## 2021-06-15 NOTE — Telephone Encounter (Signed)
Unable to [facility] need to review results and discuss possible admin of invega, also pt/caretaker needs to know about the referral to psychiatry and to discuss meds being sent to pharmacy   ?

## 2021-06-15 NOTE — Progress Notes (Signed)
Please let patient know that his liver function remains normal. Also please let him and/or his mother know that we can administer his invega injection for 1-2 months if necessary while we find him a new psychiatrist. I have already put the referral in for psychiatry. Let me know if I need to order his medication and send to his pharmacy. He will have to pick it up fro [place] and bring it to the clinic for [place] to administer his injection.

## 2021-06-15 NOTE — Telephone Encounter (Signed)
-----   Message from Sallyanne Kuster, NP sent at 06/15/2021 11:04 AM EDT ----- ?Please let patient know that his liver function remains normal. Also please let him and/or his mother know that we can administer his invega injection for 1-2 months if necessary while we find him a new psychiatrist. I have already put the referral in for psychiatry. Let me know if I need to order his medication and send to his pharmacy. He will have to pick it up fro [facility] pharmacy and bring it to the clinic for [place] to administer his injection.  ?

## 2021-06-15 NOTE — Telephone Encounter (Signed)
-----   Message from Alyssa Abernathy, NP sent at 06/15/2021 11:04 AM EDT ----- ?Please let patient know that his liver function remains normal. Also please let him and/or his mother know that we can administer his invega injection for 1-2 months if necessary while we find him a new psychiatrist. I have already put the referral in for psychiatry. Let me know if I need to order his medication and send to his pharmacy. He will have to pick it up [facility] and bring it to the clinic for us to administer his injection.  ?

## 2021-06-21 ENCOUNTER — Telehealth: Payer: Self-pay

## 2021-06-21 ENCOUNTER — Telehealth: Payer: Self-pay | Admitting: Psychiatry

## 2021-06-21 NOTE — Telephone Encounter (Signed)
Spoke with pt, his last Imvega injection was 05-21-21, he is getting another one today at [facility]. This will be the last one he gets from [facility] and will need a new psychiatrist. If pt is unable to find new psychiatrist before may 4th then that is when he is would need to have the injection in the office. [facility]. Staff message sent to [place], Penelope Galas, and Sheralyn Boatman. ?

## 2021-06-21 NOTE — Telephone Encounter (Signed)
Received call from [facility] stating that patient will need an injection of INVEGA by 07/21/2021 as he received his last one today from [facility] which is closing. No appointments available with any provider before scheduled appointment on 08/09/2021. Caller states their practice will give him his next injection. ?

## 2021-06-27 NOTE — Telephone Encounter (Signed)
Do I need to schedule 07/21/21 appointment so we can do his injections? ?

## 2021-06-28 NOTE — Telephone Encounter (Signed)
I will schedule him for office visit per dfk. ?

## 2021-07-05 ENCOUNTER — Telehealth: Payer: Self-pay

## 2021-07-06 ENCOUNTER — Telehealth: Payer: Self-pay

## 2021-07-06 NOTE — Telephone Encounter (Signed)
Spoke to pt and his parents, explained to them that if [facility] has changed his medication they have to be responsible for follow up with pt and that Alyssa cant give a medication that she did not prescribe. Pt will have to get next dose of injection from [facility] since med was changed and then new psychologist can re-evaluate and prescribe meds as needed for pts care.  ?

## 2021-07-08 ENCOUNTER — Telehealth: Payer: Self-pay

## 2021-07-08 NOTE — Telephone Encounter (Signed)
Spoke with patient's mom. Since patient is getting injection @ [facility], we will cancel his 07/21/21 appointment. She agreed-Toni ?

## 2021-07-08 NOTE — Telephone Encounter (Signed)
Pt will get next dose of injection from [facility] then new psychologist can re-evaluate and prescribe meds as needed for pts care.  ?

## 2021-07-08 NOTE — Telephone Encounter (Signed)
Spoke to pts mom, he is scheduled at [facility] for his next next injection first week of May and then he will see his new doctor end of may. We do not have to do any injections for this pt at this point in time.   ?

## 2021-07-21 ENCOUNTER — Ambulatory Visit: Payer: Medicaid Other | Admitting: Nurse Practitioner
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
## 2021-08-09 ENCOUNTER — Encounter: Payer: Self-pay | Admitting: Psychiatry

## 2021-08-09 ENCOUNTER — Ambulatory Visit (INDEPENDENT_AMBULATORY_CARE_PROVIDER_SITE_OTHER): Payer: No Typology Code available for payment source | Admitting: Psychiatry

## 2021-08-09 VITALS — BP 120/78 | HR 62 | Temp 98.1°F | Wt 164.2 lb

## 2021-08-09 DIAGNOSIS — F259 Schizoaffective disorder, unspecified: Secondary | ICD-10-CM

## 2021-09-07 NOTE — Progress Notes (Unsigned)
BH MD/PA/NP OP Progress Note  09/07/2021 5:38 PM Manuel Horn  MRN:  [id number]  Chief Complaint: No chief complaint on file.  HPI: *** Visit Diagnosis: No diagnosis found.  Past Psychiatric History: Please see initial evaluation for full details. I have reviewed the history. No updates at this time.     Past Medical History:  Past Medical History:  Diagnosis Date   Schizoaffective disorder (HCC)     Past Surgical History:  Procedure Laterality Date   CHOLECYSTECTOMY     ENDOSCOPIC RETROGRADE CHOLANGIOPANCREATOGRAPHY (ERCP) WITH PROPOFOL N/A 01/20/2020   Procedure: ENDOSCOPIC RETROGRADE CHOLANGIOPANCREATOGRAPHY (ERCP) WITH PROPOFOL;  Surgeon: Midge Minium, MD;  Location: [facility] ENDOSCOPY;  Service: Endoscopy;  Laterality: N/A;   ERCP N/A 02/17/2020   Procedure: ENDOSCOPIC RETROGRADE CHOLANGIOPANCREATOGRAPHY (ERCP);  Surgeon: Midge Minium, MD;  Location: [facility] ENDOSCOPY;  Service: Endoscopy;  Laterality: N/A;   IR PERCUTANEOUS TRANSHEPATIC CHOLANGIOGRAM  01/21/2020   LEG SURGERY Left     Family Psychiatric History: Please see initial evaluation for full details. I have reviewed the history. No updates at this time.     Family History:  Family History  Problem Relation Age of Onset   Diabetes Mother    Hypertension Mother    Depression Maternal Aunt    Schizophrenia Maternal Uncle     Social History:  Social History   Socioeconomic History   Marital status: Single    Spouse name: Not on file   Number of children: 0   Years of education: Not on file   Highest education level: Not on file  Occupational History   Occupation: disability  Tobacco Use   Smoking status: Every Day    Packs/day: 2.00    Types: Cigarettes   Smokeless tobacco: Never   Tobacco comments:    1.5 packs a day  Vaping Use   Vaping Use: Never used  Substance and Sexual Activity   Alcohol use: No   Drug use: No   Sexual activity: Never  Other Topics Concern   Not on file  Social History  Narrative   Not on file   Social Determinants of Health   Financial Resource Strain: Not on file  Food Insecurity: Not on file  Transportation Needs: Not on file  Physical Activity: Not on file  Stress: Not on file  Social Connections: Not on file    Allergies:  Allergies  Allergen Reactions   Sulfa Antibiotics Rash    Metabolic Disorder Labs: Lab Results  Component Value Date   HGBA1C 5.7 (A) 12/22/2019   No results found for: "PROLACTIN" Lab Results  Component Value Date   CHOL 132 11/16/2020   TRIG 60 11/16/2020   HDL 36 (L) 11/16/2020   CHOLHDL 3.7 11/16/2020   LDLCALC 83 11/16/2020   LDLCALC 90 11/26/2019   Lab Results  Component Value Date   TSH 2.780 11/16/2020   TSH 1.560 11/26/2019    Therapeutic Level Labs: No results found for: "LITHIUM" No results found for: "VALPROATE" No results found for: "CBMZ"  Current Medications: Current Outpatient Medications  Medication Sig Dispense Refill   paliperidone (INVEGA SUSTENNA) 234 MG/1.5ML SUSY injection Inject into the muscle.     No current facility-administered medications for this visit.     Musculoskeletal: Strength & Muscle Tone: within normal limits Gait & Station: normal Patient leans: N/A  Psychiatric Specialty Exam: Review of Systems  There were no vitals taken for this visit.There is no height or weight on file to calculate BMI.  General  Appearance: {Appearance:22683}  Eye Contact:  {BHH EYE CONTACT:22684}  Speech:  Clear and Coherent  Volume:  Normal  Mood:  {BHH MOOD:22306}  Affect:  {Affect (PAA):22687}  Thought Process:  Coherent  Orientation:  Full (Time, Place, and Person)  Thought Content: Logical   Suicidal Thoughts:  {ST/HT (PAA):22692}  Homicidal Thoughts:  {ST/HT (PAA):22692}  Memory:  Immediate;   Good  Judgement:  {Judgement (PAA):22694}  Insight:  {Insight (PAA):22695}  Psychomotor Activity:  Normal  Concentration:  Concentration: Good and Attention Span: Good   Recall:  Good  Fund of Knowledge: Good  Language: Good  Akathisia:  No  Handed:  Right  AIMS (if indicated): not done  Assets:  Communication Skills Desire for Improvement  ADL's:  Intact  Cognition: WNL  Sleep:  {BHH GOOD/FAIR/POOR:22877}   Screenings: GAD-7    Flowsheet Row Office Visit from 08/09/2021 in [facility]  Total GAD-7 Score 2      PHQ2-9    Flowsheet Row Office Visit from 08/09/2021 in [facility] Office Visit from 06/07/2021 in [facility], [facility] Office Visit from 11/15/2020 in [facility], [facility] Office Visit from 05/18/2020 in [facility], [facility] Office Visit from 02/03/2020 in [facility], [facility]  PHQ-2 Total Score 0 0 0 0 0        Assessment and Plan:  Manuel Horn is a 41 y.o. year old male with a history of schizoaffective disorder, who presents for follow up appointment for below.     1. Schizoaffective disorder, unspecified type (HCC) Exam is notable for restricted affect, although he is calm during the entire interview.  According to his parents, he was doing very well until high school, where he had an episode of aggression in the context of substance use (cocaine, marijuana).  Although he has an occasional episode of becoming loud, both the patient and his parents denies any history of depression/mania while being on [place].  Noted that this medication will be switched to Abilify at [facility] due to backorder at the pharmacy.  Will continue to assess his symptoms.    Plan He received Invega 234 mg IM on 5/3. This will be switched to Abilify Maintena at [facility] on 5/30.  Next appointment: 6/27 at 10:30 for 30 mins, in person (in the process of transferring to this clinic due to [facility] being closed.) - he was seen by his PCP lately. His parents will bring a lab result to the office.   The patient demonstrates the following risk factors for  suicide: Chronic risk factors for suicide include: psychiatric disorder of schizoaffective disorder . Acute risk factors for suicide include: unemployment. Protective factors for this patient include: positive social support. Considering these factors, the overall suicide risk at this point appears to be low. Patient is appropriate for outpatient follow up.           Collaboration of Care: Collaboration of Care: {BH OP Collaboration of Care:21014065}  Patient/Guardian was advised Release of Information must be obtained prior to any record release in order to collaborate their care with an outside provider. Patient/Guardian was advised if they have not already done so to contact the registration department to sign all necessary forms in order for [place] to release information regarding their care.   Consent: Patient/Guardian gives verbal consent for treatment and assignment of benefits for services provided during this visit. Patient/Guardian expressed understanding and agreed to proceed.    Neysa Hotter, MD 09/07/2021, 5:38  PM

## 2021-09-13 ENCOUNTER — Encounter: Payer: Self-pay | Admitting: Psychiatry

## 2021-09-13 ENCOUNTER — Ambulatory Visit (INDEPENDENT_AMBULATORY_CARE_PROVIDER_SITE_OTHER): Payer: No Typology Code available for payment source | Admitting: Psychiatry

## 2021-09-13 VITALS — BP 113/72 | HR 81 | Temp 97.9°F | Wt 155.6 lb

## 2021-09-13 DIAGNOSIS — F259 Schizoaffective disorder, unspecified: Secondary | ICD-10-CM | POA: Diagnosis not present

## 2021-09-13 MED ORDER — ARIPIPRAZOLE ER 300 MG IM SRER: 300.0000 mg | INTRAMUSCULAR | 5 refills | Status: DC

## 2021-09-13 MED ORDER — ARIPIPRAZOLE ER 300 MG IM PRSY
300.0000 mg | PREFILLED_SYRINGE | INTRAMUSCULAR | Status: DC
  Administered 2021-09-15 – 2022-01-06 (×5): 300 mg via INTRAMUSCULAR

## 2021-09-14 ENCOUNTER — Ambulatory Visit: Payer: No Typology Code available for payment source

## 2021-09-15 ENCOUNTER — Ambulatory Visit (INDEPENDENT_AMBULATORY_CARE_PROVIDER_SITE_OTHER): Payer: No Typology Code available for payment source

## 2021-09-15 DIAGNOSIS — F259 Schizoaffective disorder, unspecified: Secondary | ICD-10-CM | POA: Diagnosis not present

## 2021-09-15 NOTE — Patient Instructions (Addendum)
Patient came into the office today for his 1st injection of abilify maintena 300mg  with our clinic.  Pt states that he been on the abilify injections for  about 7 years and is doing well with the injections.  Pt was given the injection in the left deltoid.  Patient states he didn't even feel injection. Patient did fine during and after the injection was given.  Patient was told to come back 28 days for next injection.   Abilify maintena 300mg   NDC#  GTIN#   SN [id number]  EXP  Aug 2025  Lot # [id number]

## 2021-09-16 ENCOUNTER — Ambulatory Visit: Payer: No Typology Code available for payment source

## 2021-10-08 NOTE — Progress Notes (Unsigned)
BH MD/PA/NP OP Progress Note  10/11/2021 2:57 PM Manuel Horn  MRN:  [id number]  Chief Complaint:  Chief Complaint  Patient presents with   Follow-up   HPI:  This is a follow-up appointment for schizophrenia.  He states that he has been doing fine.  He is hoping to quit smoking.  He currently smokes 1 pack/day.  He tends to smoke when he is bored.  He works at daycare at times, and mulch grass.  He continues to go to [employer].  He has not noticed any difference since switching to Abilify.  He feels less drowsy compared to [place].  He has occasional insomnia.  He denies change in appetite.  He denies feeling depressed or anxiety.  He denies SI, HI.  He denies AH, VH, paranoia.   His mother presents to the interview.  She has not noticed any difference since switching to Abilify, and denies any concern except requesting medication for insomnia.    Wt Readings from Last 3 Encounters:  10/11/21 155 lb 6.4 oz (70.5 kg)  09/15/21 157 lb (71.2 kg)  09/13/21 155 lb 9.6 oz (70.6 kg)     Visit Diagnosis:    ICD-10-CM   1. Schizoaffective disorder, unspecified type (HCC)  F25.9     2. Insomnia, unspecified type  G47.00       Past Psychiatric History: Please see initial evaluation for full details. I have reviewed the history. No updates at this time.     Past Medical History:  Past Medical History:  Diagnosis Date   Schizoaffective disorder (HCC)     Past Surgical History:  Procedure Laterality Date   CHOLECYSTECTOMY     ENDOSCOPIC RETROGRADE CHOLANGIOPANCREATOGRAPHY (ERCP) WITH PROPOFOL N/A 01/20/2020   Procedure: ENDOSCOPIC RETROGRADE CHOLANGIOPANCREATOGRAPHY (ERCP) WITH PROPOFOL;  Surgeon: Midge Minium, MD;  Location: [facility] ENDOSCOPY;  Service: Endoscopy;  Laterality: N/A;   ERCP N/A 02/17/2020   Procedure: ENDOSCOPIC RETROGRADE CHOLANGIOPANCREATOGRAPHY (ERCP);  Surgeon: Midge Minium, MD;  Location: [facility] ENDOSCOPY;  Service: Endoscopy;  Laterality: N/A;   IR PERCUTANEOUS  TRANSHEPATIC CHOLANGIOGRAM  01/21/2020   LEG SURGERY Left     Family Psychiatric History: Please see initial evaluation for full details. I have reviewed the history. No updates at this time.     Family History:  Family History  Problem Relation Age of Onset   Diabetes Mother    Hypertension Mother    Depression Maternal Aunt    Schizophrenia Maternal Uncle     Social History:  Social History   Socioeconomic History   Marital status: Single    Spouse name: Not on file   Number of children: 0   Years of education: Not on file   Highest education level: Not on file  Occupational History   Occupation: disability  Tobacco Use   Smoking status: Every Day    Packs/day: 2.00    Types: Cigarettes   Smokeless tobacco: Never   Tobacco comments:    1.5 packs a day  Vaping Use   Vaping Use: Never used  Substance and Sexual Activity   Alcohol use: No   Drug use: No   Sexual activity: Never  Other Topics Concern   Not on file  Social History Narrative   Not on file   Social Determinants of Health   Financial Resource Strain: Not on file  Food Insecurity: Not on file  Transportation Needs: Not on file  Physical Activity: Not on file  Stress: Not on file  Social Connections: Not  on file    Allergies:  Allergies  Allergen Reactions   Sulfa Antibiotics Rash    Metabolic Disorder Labs: Lab Results  Component Value Date   HGBA1C 5.7 (A) 12/22/2019   No results found for: "PROLACTIN" Lab Results  Component Value Date   CHOL 132 11/16/2020   TRIG 60 11/16/2020   HDL 36 (L) 11/16/2020   CHOLHDL 3.7 11/16/2020   LDLCALC 83 11/16/2020   LDLCALC 90 11/26/2019   Lab Results  Component Value Date   TSH 2.780 11/16/2020   TSH 1.560 11/26/2019    Therapeutic Level Labs: No results found for: "LITHIUM" No results found for: "VALPROATE" No results found for: "CBMZ"  Current Medications: Current Outpatient Medications  Medication Sig Dispense Refill    ARIPiprazole ER (ABILIFY MAINTENA) 300 MG SRER injection Inject 1.5 mLs (300 mg total) into the muscle every 28 (twenty-eight) days for 6 doses. 1.5 mL 5   traZODone (DESYREL) 50 MG tablet Take 0.5-1 tablets (25-50 mg total) by mouth at bedtime. 30 tablet 0   Current Facility-Administered Medications  Medication Dose Route Frequency Provider Last Rate Last Admin   ARIPiprazole ER (ABILIFY MAINTENA) 300 MG prefilled syringe 300 mg  300 mg Intramuscular Q28 days Zakery Normington, Barbee Cough, MD   300 mg at 09/15/21 1057     Musculoskeletal: Strength & Muscle Tone: within normal limits Gait & Station: normal Patient leans: N/A  Psychiatric Specialty Exam: Review of Systems  Psychiatric/Behavioral:  Positive for sleep disturbance. Negative for agitation, behavioral problems, confusion, decreased concentration, dysphoric mood, hallucinations, self-injury and suicidal ideas. The patient is not nervous/anxious and is not hyperactive.   All other systems reviewed and are negative.   Blood pressure 106/70, pulse 70, temperature 97.9 F (36.6 C), temperature source Temporal, weight 155 lb 6.4 oz (70.5 kg).Body mass index is 21.08 kg/m.  General Appearance: Fairly Groomed  Eye Contact:  Good  Speech:  Clear and Coherent  Volume:  Normal  Mood:   good  Affect:  Appropriate, Congruent, and Restricted  Thought Process:  Coherent  Orientation:  Full (Time, Place, and Person)  Thought Content: Logical   Suicidal Thoughts:  No  Homicidal Thoughts:  No  Memory:  Immediate;   Good  Judgement:  Good  Insight:  Present  Psychomotor Activity:  Normal  Concentration:  Concentration: Good and Attention Span: Good  Recall:  Good  Fund of Knowledge: Good  Language: Good  Akathisia:  No  Handed:  Right  AIMS (if indicated): not done  Assets:  Communication Skills Desire for Improvement  ADL's:  Intact  Cognition: WNL  Sleep:  Poor   Screenings: GAD-7    Flowsheet Row Office Visit from 10/11/2021 in  [facility] Office Visit from 08/09/2021 in [facility]  Total GAD-7 Score 0 2      PHQ2-9    Flowsheet Row Office Visit from 10/11/2021 in [facility] Office Visit from 09/13/2021 in [facility] Office Visit from 08/09/2021 in [facility] Office Visit from 06/07/2021 in [facility], [facility] Office Visit from 11/15/2020 in [facility], [facility]  PHQ-2 Total Score 0 1 0 0 0  PHQ-9 Total Score 2 4 -- -- --        Assessment and Plan:  Manuel Horn is a 41 y.o. year old male with a history of schizoaffective disorder, who presents for follow up appointment for below.   1. Schizoaffective disorder, unspecified type (HCC) He  continues to demonstrate restricted affect, although he is calm through the entire interview, which has been consistent since the initial visit. He enjoys going to church and he has been more communicative lately, and denies any behavior issues according to his mother.  He was reportedly doing very well until high school, where he had an episode of aggression in the context of substance use (cocaine, marijuana).  He has tolerate Abilify injection, which was switched from [place] due to back order at [place].  Will continue monthly injection to target schizoaffective disorder.   2. Insomnia, unspecified type He reports initial insomnia.  Will start trazodone as needed for insomnia.    # weight loss He had 20 pounds weight loss over the past few months.  Both the patient and his mother agree to be seen by PCP for further evaluation.    Plan Continue Abilify 300 mg IM due today (last injection on 5/30) Start Trazodone 25-50 mg at night  Next appointment: 8/24 at 8:30 for 30 mins, in person  - he will be seen by PCP in August. His parents will bring a lab result to the office.   The patient demonstrates the following  risk factors for suicide: Chronic risk factors for suicide include: psychiatric disorder of schizoaffective disorder . Acute risk factors for suicide include: unemployment. Protective factors for this patient include: positive social support. Considering these factors, the overall suicide risk at this point appears to be low. Patient is appropriate for outpatient follow up.           Collaboration of Care: Collaboration of Care: Other N/A  Patient/Guardian was advised Release of Information must be obtained prior to any record release in order to collaborate their care with an outside provider. Patient/Guardian was advised if they have not already done so to contact the registration department to sign all necessary forms in order for [place] to release information regarding their care.   Consent: Patient/Guardian gives verbal consent for treatment and assignment of benefits for services provided during this visit. Patient/Guardian expressed understanding and agreed to proceed.    Neysa Hotter, MD 10/11/2021, 2:57 PM

## 2021-10-11 ENCOUNTER — Encounter: Payer: Self-pay | Admitting: Psychiatry

## 2021-10-11 ENCOUNTER — Ambulatory Visit (INDEPENDENT_AMBULATORY_CARE_PROVIDER_SITE_OTHER): Payer: No Typology Code available for payment source | Admitting: Psychiatry

## 2021-10-11 ENCOUNTER — Ambulatory Visit (INDEPENDENT_AMBULATORY_CARE_PROVIDER_SITE_OTHER): Payer: No Typology Code available for payment source

## 2021-10-11 VITALS — BP 106/70 | HR 70 | Temp 97.9°F | Wt 155.4 lb

## 2021-10-11 DIAGNOSIS — F259 Schizoaffective disorder, unspecified: Secondary | ICD-10-CM

## 2021-10-11 DIAGNOSIS — G4700 Insomnia, unspecified: Secondary | ICD-10-CM

## 2021-10-11 MED ORDER — TRAZODONE HCL 50 MG PO TABS: 25.0000 mg | ORAL_TABLET | Freq: Every day | ORAL | 0 refills | Status: DC

## 2021-10-11 NOTE — Patient Instructions (Signed)
Pt was given the injection in the right deltoid.  Patient states he has been doing good on the dosage he is taking.  Patient did fine during and after the injection was given.  Patient was told to come back 28 days for next injection.      Abilify maintena 300mg   NDC#  GTIN#  [id number]  SN [id number]  EXP  Aug 2025  Lot # [id number]

## 2021-10-31 ENCOUNTER — Ambulatory Visit (INDEPENDENT_AMBULATORY_CARE_PROVIDER_SITE_OTHER): Payer: Medicaid Other | Admitting: Nurse Practitioner

## 2021-10-31 ENCOUNTER — Encounter: Payer: Self-pay | Admitting: Nurse Practitioner

## 2021-10-31 VITALS — BP 115/64 | HR 84 | Temp 98.2°F | Resp 16 | Ht 72.0 in | Wt 154.0 lb

## 2021-10-31 DIAGNOSIS — Z0001 Encounter for general adult medical examination with abnormal findings: Secondary | ICD-10-CM | POA: Diagnosis not present

## 2021-10-31 DIAGNOSIS — E559 Vitamin D deficiency, unspecified: Secondary | ICD-10-CM | POA: Diagnosis not present

## 2021-10-31 DIAGNOSIS — L639 Alopecia areata, unspecified: Secondary | ICD-10-CM

## 2021-10-31 DIAGNOSIS — R7989 Other specified abnormal findings of blood chemistry: Secondary | ICD-10-CM | POA: Diagnosis not present

## 2021-10-31 DIAGNOSIS — E782 Mixed hyperlipidemia: Secondary | ICD-10-CM

## 2021-10-31 DIAGNOSIS — F259 Schizoaffective disorder, unspecified: Secondary | ICD-10-CM

## 2021-10-31 DIAGNOSIS — R300 Dysuria: Secondary | ICD-10-CM

## 2021-10-31 NOTE — Progress Notes (Signed)
[facility] [place], [place] [address]  Internal MEDICINE  Office Visit Note  Patient Name: Manuel Horn  [id number]  [id number]  Date of Service: 10/31/2021  Chief Complaint  Patient presents with   Annual Exam   Hair/Scalp Problem    Bald spots    HPI [place] presents for an annual well visit and physical exam.  Well-appearing 41 year old male with schizoaffective disorder, gallstones, and tobacco use.  Recently switched to a new psychiatrist because [facility] in [place] closed down.  --has scattered bald spots on scalp.  Blood pressure and other vital signs are normal.  No preventive screenings due.  --has history of elevated liver enzymes and jaundice, liver ultrasound was down last year.  --still lives with family, on disability. --not interested in smoking cessation.       Current Medication: Outpatient Encounter Medications as of 10/31/2021  Medication Sig   ARIPiprazole ER (ABILIFY MAINTENA) 300 MG SRER injection Inject 1.5 mLs (300 mg total) into the muscle every 28 (twenty-eight) days for 6 doses.   traZODone (DESYREL) 50 MG tablet Take 0.5-1 tablets (25-50 mg total) by mouth at bedtime.   Facility-Administered Encounter Medications as of 10/31/2021  Medication   ARIPiprazole ER (ABILIFY MAINTENA) 300 MG prefilled syringe 300 mg    Surgical History: Past Surgical History:  Procedure Laterality Date   CHOLECYSTECTOMY     ENDOSCOPIC RETROGRADE CHOLANGIOPANCREATOGRAPHY (ERCP) WITH PROPOFOL N/A 01/20/2020   Procedure: ENDOSCOPIC RETROGRADE CHOLANGIOPANCREATOGRAPHY (ERCP) WITH PROPOFOL;  Surgeon: Lucilla Lame, MD;  Location: [facility] ENDOSCOPY;  Service: Endoscopy;  Laterality: N/A;   ERCP N/A 02/17/2020   Procedure: ENDOSCOPIC RETROGRADE CHOLANGIOPANCREATOGRAPHY (ERCP);  Surgeon: Lucilla Lame, MD;  Location: [facility] ENDOSCOPY;  Service: Endoscopy;  Laterality: N/A;   IR PERCUTANEOUS TRANSHEPATIC CHOLANGIOGRAM  01/21/2020   LEG SURGERY Left      Medical History: Past Medical History:  Diagnosis Date   Schizoaffective disorder ([place])     Family History: Family History  Problem Relation Age of Onset   Diabetes Mother    Hypertension Mother    Depression Maternal Aunt    Schizophrenia Maternal Uncle     Social History   Socioeconomic History   Marital status: Single    Spouse name: Not on file   Number of children: 0   Years of education: Not on file   Highest education level: Not on file  Occupational History   Occupation: disability  Tobacco Use   Smoking status: Every Day    Packs/day: 2.00    Types: Cigarettes   Smokeless tobacco: Never   Tobacco comments:    1.5 packs a day  Vaping Use   Vaping Use: Never used  Substance and Sexual Activity   Alcohol use: No   Drug use: No   Sexual activity: Never  Other Topics Concern   Not on file  Social History Narrative   Not on file   Social Determinants of Health   Financial Resource Strain: Not on file  Food Insecurity: Not on file  Transportation Needs: Not on file  Physical Activity: Not on file  Stress: Not on file  Social Connections: Not on file  Intimate Partner Violence: Not on file      Review of Systems  Constitutional:  Negative for activity change, appetite change, chills, fatigue, fever and unexpected weight change.  HENT: Negative.  Negative for congestion, ear pain, rhinorrhea, sore throat and trouble swallowing.   Eyes: Negative.   Respiratory: Negative.  Negative for  cough, chest tightness, shortness of breath and wheezing.   Cardiovascular: Negative.  Negative for chest pain.  Gastrointestinal: Negative.  Negative for abdominal pain, blood in stool, constipation, diarrhea, nausea and vomiting.  Endocrine: Negative.   Genitourinary: Negative.  Negative for difficulty urinating, dysuria, frequency, hematuria and urgency.  Musculoskeletal: Negative.  Negative for arthralgias, back pain, joint swelling, myalgias and neck pain.   Skin: Negative.  Negative for rash and wound.  Allergic/Immunologic: Negative.  Negative for immunocompromised state.  Neurological: Negative.  Negative for dizziness, seizures, numbness and headaches.  Hematological: Negative.   Psychiatric/Behavioral: Negative.  Negative for behavioral problems, self-injury and suicidal ideas. The patient is not nervous/anxious.     Vital Signs: BP 115/64   Pulse 84   Temp 98.2 F (36.8 C)   Resp 16   Ht 6' (1.829 m)   Wt 154 lb (69.9 kg)   SpO2 98%   BMI 20.89 kg/m    Physical Exam Vitals reviewed.  Constitutional:      General: He is awake. He is not in acute distress.    Appearance: Normal appearance. He is well-developed, well-groomed and normal weight. He is not ill-appearing or diaphoretic.  HENT:     Head: Normocephalic and atraumatic.     Right Ear: Tympanic membrane, ear canal and external ear normal.     Left Ear: Tympanic membrane, ear canal and external ear normal.     Nose: Nose normal. No congestion or rhinorrhea.     Mouth/Throat:     Lips: Pink.     Mouth: Mucous membranes are moist.     Pharynx: Oropharynx is clear. Uvula midline. No oropharyngeal exudate or posterior oropharyngeal erythema.  Eyes:     General: Lids are normal. Vision grossly intact. Gaze aligned appropriately.        Right eye: No discharge.        Left eye: No discharge.     Extraocular Movements: Extraocular movements intact.     Conjunctiva/sclera: Conjunctivae normal.     Pupils: Pupils are equal, round, and reactive to light.     Funduscopic exam:    Right eye: Red reflex present.        Left eye: Red reflex present. Neck:     Thyroid: No thyromegaly.     Vascular: No carotid bruit or JVD.     Trachea: Trachea and phonation normal. No tracheal deviation.  Cardiovascular:     Rate and Rhythm: Normal rate and regular rhythm.     Pulses:          Carotid pulses are 3+ on the right side and 3+ on the left side.      Radial pulses are 2+ on  the right side and 2+ on the left side.       Dorsalis pedis pulses are 2+ on the right side and 2+ on the left side.       Posterior tibial pulses are 2+ on the right side and 2+ on the left side.     Heart sounds: Normal heart sounds, S1 normal and S2 normal. No murmur heard.    No friction rub. No gallop.  Pulmonary:     Effort: Pulmonary effort is normal. No accessory muscle usage or respiratory distress.     Breath sounds: Normal breath sounds and air entry. No stridor. No wheezing or rales.  Chest:     Chest wall: No tenderness.  Abdominal:     General: Bowel sounds are normal. There is  no distension.     Palpations: Abdomen is soft. There is no shifting dullness, fluid wave, mass or pulsatile mass.     Tenderness: There is no abdominal tenderness. There is no guarding or rebound.  Musculoskeletal:        General: No tenderness or deformity. Normal range of motion.     Cervical back: Normal range of motion and neck supple.     Right lower leg: No edema.     Left lower leg: No edema.  Lymphadenopathy:     Cervical: No cervical adenopathy.  Skin:    General: Skin is warm and dry.     Capillary Refill: Capillary refill takes less than 2 seconds.     Coloration: Skin is not pale.     Findings: No erythema or rash.  Neurological:     Mental Status: He is alert and oriented to person, place, and time.     Cranial Nerves: No cranial nerve deficit.     Motor: No abnormal muscle tone.     Coordination: Coordination normal.     Gait: Gait normal.     Deep Tendon Reflexes: Reflexes are normal and symmetric.  Psychiatric:        Mood and Affect: Mood and affect normal.        Behavior: Behavior normal. Behavior is cooperative.        Thought Content: Thought content normal.        Judgment: Judgment normal.        Assessment/Plan: 1. Encounter for routine adult health examination with abnormal findings Age-appropriate preventive screenings and vaccinations discussed, annual  physical exam completed. Routine labs for health maintenance ordered, see below. PHM updated.  - CBC with Differential/Platelet - CMP14+EGFR - Lipid Profile - Vitamin D (25 hydroxy) - TSH + free T4  2. Alopecia areata Labs ordered for further eval, consider dermatology referral but patient wanted to wait for now. - CBC with Differential/Platelet - TSH + free T4  3. Elevated serum creatinine Routine labs ordered - CBC with Differential/Platelet - CMP14+EGFR  4. Mixed hyperlipidemia Routine labs ordered - CBC with Differential/Platelet - Lipid Profile  5. Vitamin D deficiency Routine labs ordered - CBC with Differential/Platelet - Vitamin D (25 hydroxy)  6. Dysuria Routine urinalysis done - UA/M w/rflx Culture, Routine - Microscopic Examination - Urine Culture, Reflex  7. Schizoaffective disorder, unspecified type ([place]) Followed by psych, Dr. Shea Evans. Gets monthly abilify maintaina injection.        General Counseling: garrette caine understanding of the findings of todays visit and agrees with plan of treatment. I have discussed any further diagnostic evaluation that may be needed or ordered today. We also reviewed his medications today. he has been encouraged to call the office with any questions or concerns that should arise related to todays visit.    Orders Placed This Encounter  Procedures   Microscopic Examination   Urine Culture, Reflex   UA/M w/rflx Culture, Routine   CBC with Differential/Platelet   CMP14+EGFR   Lipid Profile   Vitamin D (25 hydroxy)   TSH + free T4    No orders of the defined types were placed in this encounter.   Return in about 6 months (around 05/03/2022) for F/U, Maria Coin PCP.   Total time spent:30 Minutes Time spent includes review of chart, medications, test results, and follow up plan with the patient.   [place] Controlled Substance Database was reviewed by me.  This patient was seen by Jonetta Osgood, FNP-C in  collaboration with Dr. Clayborn Bigness as a part of collaborative care agreement.  Josee Speece R. Valetta Fuller, MSN, FNP-C Internal medicine

## 2021-11-02 LAB — CMP14+EGFR
ALT: 9 IU/L (ref 0–44)
AST: 15 IU/L (ref 0–40)
Albumin/Globulin Ratio: 1.6 (ref 1.2–2.2)
Albumin: 4.2 g/dL (ref 4.1–5.1)
Alkaline Phosphatase: 83 IU/L (ref 44–121)
BUN/Creatinine Ratio: 7 — ABNORMAL LOW (ref 9–20)
BUN: 8 mg/dL (ref 6–24)
Bilirubin Total: 0.3 mg/dL (ref 0.0–1.2)
CO2: 25 mmol/L (ref 20–29)
Calcium: 9.3 mg/dL (ref 8.7–10.2)
Chloride: 101 mmol/L (ref 96–106)
Creatinine, Ser: 1.1 mg/dL (ref 0.76–1.27)
Globulin, Total: 2.6 g/dL (ref 1.5–4.5)
Glucose: 105 mg/dL — ABNORMAL HIGH (ref 70–99)
Potassium: 4 mmol/L (ref 3.5–5.2)
Sodium: 139 mmol/L (ref 134–144)
Total Protein: 6.8 g/dL (ref 6.0–8.5)
eGFR: 86 mL/min/{1.73_m2} (ref >59)

## 2021-11-02 LAB — LIPID PANEL
Chol/HDL Ratio: 4.3 ratio (ref 0.0–5.0)
Cholesterol, Total: 116 mg/dL (ref 100–199)
HDL: 27 mg/dL — ABNORMAL LOW (ref >39)
LDL Chol Calc (NIH): 71 mg/dL (ref 0–99)
Triglycerides: 94 mg/dL (ref 0–149)
VLDL Cholesterol Cal: 18 mg/dL (ref 5–40)

## 2021-11-02 LAB — CBC WITH DIFFERENTIAL/PLATELET
Basophils Absolute: 0 10*3/uL (ref 0.0–0.2)
Basos: 0 %
EOS (ABSOLUTE): 0.1 10*3/uL (ref 0.0–0.4)
Eos: 1 %
Hematocrit: 37 % — ABNORMAL LOW (ref 37.5–51.0)
Hemoglobin: 13.1 g/dL (ref 13.0–17.7)
Immature Grans (Abs): 0 10*3/uL (ref 0.0–0.1)
Immature Granulocytes: 0 %
Lymphocytes Absolute: 3.6 10*3/uL — ABNORMAL HIGH (ref 0.7–3.1)
Lymphs: 32 %
MCH: 34.7 pg — ABNORMAL HIGH (ref 26.6–33.0)
MCHC: 35.4 g/dL (ref 31.5–35.7)
MCV: 98 fL — ABNORMAL HIGH (ref 79–97)
Monocytes Absolute: 0.7 10*3/uL (ref 0.1–0.9)
Monocytes: 6 %
Neutrophils Absolute: 6.8 10*3/uL (ref 1.4–7.0)
Neutrophils: 61 %
Platelets: 202 10*3/uL (ref 150–450)
RBC: 3.78 x10E6/uL — ABNORMAL LOW (ref 4.14–5.80)
RDW: 12 % (ref 11.6–15.4)
WBC: 11.2 10*3/uL — ABNORMAL HIGH (ref 3.4–10.8)

## 2021-11-02 LAB — VITAMIN D 25 HYDROXY (VIT D DEFICIENCY, FRACTURES): Vit D, 25-Hydroxy: 30.3 ng/mL (ref 30.0–100.0)

## 2021-11-02 LAB — TSH+FREE T4
Free T4: 0.98 ng/dL (ref 0.82–1.77)
TSH: 1.65 u[IU]/mL (ref 0.450–4.500)

## 2021-11-03 LAB — MICROSCOPIC EXAMINATION
Casts: NONE SEEN /lpf
RBC, Urine: NONE SEEN /hpf (ref 0–2)
WBC, UA: >30 /hpf — AB (ref 0–5)

## 2021-11-03 LAB — UA/M W/RFLX CULTURE, ROUTINE
Bilirubin, UA: NEGATIVE
Glucose, UA: NEGATIVE
Nitrite, UA: NEGATIVE
Specific Gravity, UA: >=1.03 — AB (ref 1.005–1.030)
Urobilinogen, Ur: 1 mg/dL (ref 0.2–1.0)
pH, UA: 5.5 (ref 5.0–7.5)

## 2021-11-03 LAB — URINE CULTURE, REFLEX

## 2021-11-08 NOTE — Progress Notes (Unsigned)
BH MD/PA/NP OP Progress Note  11/10/2021 8:58 AM Manuel Horn  MRN:  [id number]  Chief Complaint:  Chief Complaint  Patient presents with   Follow-up   HPI:  This is a follow-up appointment for schizoaffective disorder and insomnia.  He states that he has been doing well.  He is smoking as he wants to focus.  He feels cloudy, although he does not necessarily think it has been affecting his function.  He likes going to church, and visiting other family.  He sleeps better with trazodone.  He denies any drowsiness in the morning.  He denies feeling depressed or anxiety.  He denies SI, HI, paranoia, hallucinations.  He denies ideas of reference.  He denies alcohol use or drug use.   His mother presents to the interview.  She has not noticed any change.  She thinks his irritability has improved.  She denies any concern at this time.   Wt Readings from Last 3 Encounters:  11/10/21 153 lb (69.4 kg)  10/31/21 154 lb (69.9 kg)  10/11/21 155 lb 6.4 oz (70.5 kg)    Visit Diagnosis:    ICD-10-CM   1. Schizoaffective disorder, unspecified type (HCC)  F25.9     2. Insomnia, unspecified type  G47.00       Past Psychiatric History: Please see initial evaluation for full details. I have reviewed the history. No updates at this time.     Past Medical History:  Past Medical History:  Diagnosis Date   Schizoaffective disorder (HCC)     Past Surgical History:  Procedure Laterality Date   CHOLECYSTECTOMY     ENDOSCOPIC RETROGRADE CHOLANGIOPANCREATOGRAPHY (ERCP) WITH PROPOFOL N/A 01/20/2020   Procedure: ENDOSCOPIC RETROGRADE CHOLANGIOPANCREATOGRAPHY (ERCP) WITH PROPOFOL;  Surgeon: Midge Minium, MD;  Location: [facility] ENDOSCOPY;  Service: Endoscopy;  Laterality: N/A;   ERCP N/A 02/17/2020   Procedure: ENDOSCOPIC RETROGRADE CHOLANGIOPANCREATOGRAPHY (ERCP);  Surgeon: Midge Minium, MD;  Location: [facility] ENDOSCOPY;  Service: Endoscopy;  Laterality: N/A;   IR PERCUTANEOUS TRANSHEPATIC CHOLANGIOGRAM   01/21/2020   LEG SURGERY Left     Family Psychiatric History: Please see initial evaluation for full details. I have reviewed the history. No updates at this time.     Family History:  Family History  Problem Relation Age of Onset   Diabetes Mother    Hypertension Mother    Depression Maternal Aunt    Schizophrenia Maternal Uncle     Social History:  Social History   Socioeconomic History   Marital status: Single    Spouse name: Not on file   Number of children: 0   Years of education: Not on file   Highest education level: Not on file  Occupational History   Occupation: disability  Tobacco Use   Smoking status: Every Day    Packs/day: 2.00    Types: Cigarettes   Smokeless tobacco: Never   Tobacco comments:    1.5 packs a day  Vaping Use   Vaping Use: Never used  Substance and Sexual Activity   Alcohol use: No   Drug use: No   Sexual activity: Never  Other Topics Concern   Not on file  Social History Narrative   Not on file   Social Determinants of Health   Financial Resource Strain: Not on file  Food Insecurity: Not on file  Transportation Needs: Not on file  Physical Activity: Not on file  Stress: Not on file  Social Connections: Not on file    Allergies:  Allergies  Allergen  Reactions   Sulfa Antibiotics Rash    Metabolic Disorder Labs: Lab Results  Component Value Date   HGBA1C 5.7 (A) 12/22/2019   No results found for: "PROLACTIN" Lab Results  Component Value Date   CHOL 116 11/01/2021   TRIG 94 11/01/2021   HDL 27 (L) 11/01/2021   CHOLHDL 4.3 11/01/2021   LDLCALC 71 11/01/2021   LDLCALC 83 11/16/2020   Lab Results  Component Value Date   TSH 1.650 11/01/2021   TSH 2.780 11/16/2020    Therapeutic Level Labs: No results found for: "LITHIUM" No results found for: "VALPROATE" No results found for: "CBMZ"  Current Medications: Current Outpatient Medications  Medication Sig Dispense Refill   ARIPiprazole ER (ABILIFY MAINTENA)  300 MG SRER injection Inject 1.5 mLs (300 mg total) into the muscle every 28 (twenty-eight) days for 6 doses. 1.5 mL 5   traZODone (DESYREL) 50 MG tablet Take 0.5-1 tablets (25-50 mg total) by mouth at bedtime. 30 tablet 0   Current Facility-Administered Medications  Medication Dose Route Frequency Provider Last Rate Last Admin   ARIPiprazole ER (ABILIFY MAINTENA) 300 MG prefilled syringe 300 mg  300 mg Intramuscular Q28 days Audrea Bolte, MD   300 mg at 11/10/21 0845     Musculoskeletal: Strength & Muscle Tone: within normal limits Gait & Station: normal Patient leans: N/A  Psychiatric Specialty Exam: Review of Systems  Blood pressure 107/74, pulse 76, temperature 97.9 F (36.6 C), temperature source Temporal, weight 153 lb (69.4 kg).Body mass index is 20.75 kg/m.  General Appearance: Fairly Groomed  Eye Contact:  Fair- looking down most of the time, but will turn his face up when he is asked questions  Speech:  Clear and Coherent  Volume:  Normal  Mood:   good  Affect:  Appropriate, Congruent, and slightly restricted  Thought Process:  Coherent  Orientation:  Full (Time, Place, and Person)  Thought Content: Logical   Suicidal Thoughts:  No  Homicidal Thoughts:  No  Memory:  Immediate;   Good  Judgement:  Good  Insight:  Present  Psychomotor Activity:  Normal  Concentration:  Concentration: Good and Attention Span: Good  Recall:  Good  Fund of Knowledge: Good  Language: Good  Akathisia:  No  Handed:  Right  AIMS (if indicated): not done  Assets:  Communication Skills Desire for Improvement  ADL's:  Intact  Cognition: WNL  Sleep:  Fair   Screenings: GAD-7    Flowsheet Row Office Visit from 11/10/2021 in [facility] Office Visit from 10/11/2021 in [facility] Office Visit from 08/09/2021 in [facility]  Total GAD-7 Score 2 0 2      PHQ2-9    Flowsheet Row Office Visit from  11/10/2021 in [facility] Office Visit from 10/31/2021 in [facility] Office Visit from 10/11/2021 in [facility] Office Visit from 09/13/2021 in [facility] Office Visit from 08/09/2021 in [facility]  PHQ-2 Total Score 0 0 0 1 0  PHQ-9 Total Score 3 -- 2 4 --      Flowsheet Row Office Visit from 11/10/2021 in [facility]  C-SSRS RISK CATEGORY No Risk        Assessment and Plan:  LISTER BRIZZI is a 41 y.o. year old male with a history of schizoaffective disorder, who presents for follow up appointment for below.   1. Schizoaffective disorder, unspecified type (HCC) He is calm through  the entire interview, which has been consistent since the initial visit.  He enjoys going to church, and wants to work on smoking cessation, although he is not interested in pharmacological treatment.  He was reportedly doing very well until high school, where he had an episode of aggression in the context of substance use (cocaine, marijuana).  Will continue Abilify injection to target schizoaffective disorder.   2. Insomnia, unspecified type Improving.  Weight and any current dose of trazodone as needed for insomnia.    # weight loss He had 20 pounds weight loss over the past few months, although it has been plateau.  He was seen by PCP; will continue to monitor this.    Plan Continue Abilify 300 mg IM every 28 days. Continue Trazodone 25-50 mg at night - he declined a refill Next appointment:  11/16 at 10:30 for 30 mins, in person    The patient demonstrates the following risk factors for suicide: Chronic risk factors for suicide include: psychiatric disorder of schizoaffective disorder . Acute risk factors for suicide include: unemployment. Protective factors for this patient include: positive social support. Considering these factors, the overall  suicide risk at this point appears to be low. Patient is appropriate for outpatient follow up.         This clinician has discussed the side effect associated with medication prescribed during this encounter. Please refer to notes in the previous encounters for more details.        Collaboration of Care: Collaboration of Care: Other N/A  Patient/Guardian was advised Release of Information must be obtained prior to any record release in order to collaborate their care with an outside provider. Patient/Guardian was advised if they have not already done so to contact the registration department to sign all necessary forms in order for [place] to release information regarding their care.   Consent: Patient/Guardian gives verbal consent for treatment and assignment of benefits for services provided during this visit. Patient/Guardian expressed understanding and agreed to proceed.    Neysa Hotter, MD 11/10/2021, 8:58 AM

## 2021-11-10 ENCOUNTER — Ambulatory Visit (INDEPENDENT_AMBULATORY_CARE_PROVIDER_SITE_OTHER): Payer: No Typology Code available for payment source | Admitting: Psychiatry

## 2021-11-10 ENCOUNTER — Ambulatory Visit (INDEPENDENT_AMBULATORY_CARE_PROVIDER_SITE_OTHER): Payer: No Typology Code available for payment source

## 2021-11-10 ENCOUNTER — Encounter: Payer: Self-pay | Admitting: Psychiatry

## 2021-11-10 VITALS — BP 107/74 | HR 76 | Temp 97.9°F | Wt 153.0 lb

## 2021-11-10 DIAGNOSIS — F259 Schizoaffective disorder, unspecified: Secondary | ICD-10-CM

## 2021-11-10 DIAGNOSIS — G4700 Insomnia, unspecified: Secondary | ICD-10-CM | POA: Diagnosis not present

## 2021-11-10 NOTE — Patient Instructions (Signed)
Pt was given the injection in the left deltoid.  Patient states he has been doing good on the dosage he is taking.  Patient did fine during and after the injection was given.  Patient was told to come back 28 days for next injection.      Abilify maintena 300mg   NDC#  GTIN#  23953-202-33 SN [id number]  nov 2025  Lot # [id number]     Instructions   Return in about 4 weeks (around 11/08/2021). Pt was given the injection in the right deltoid.  Patient states he has been doing good on the dosage he is taking.  Patient did fine during and after the injection was given.  Patient was told to come back 28 days for next injection.

## 2021-11-21 NOTE — Progress Notes (Signed)
Chronically anemic with hx elevated ferritin level, will order hematology referral Kidney function is normal Liver function is normal Glucose slightly elevated Cholesterol levels, no significant change Thyroid levels are normal Vitamin D level is low normal

## 2021-12-08 ENCOUNTER — Ambulatory Visit: Payer: No Typology Code available for payment source

## 2021-12-09 ENCOUNTER — Ambulatory Visit (INDEPENDENT_AMBULATORY_CARE_PROVIDER_SITE_OTHER): Payer: No Typology Code available for payment source

## 2021-12-09 DIAGNOSIS — F259 Schizoaffective disorder, unspecified: Secondary | ICD-10-CM

## 2021-12-09 NOTE — Patient Instructions (Signed)
Pt was given the injection in the right deltoid.  Patient states he has been doing good on the dosage he is taking.  Patient did fine during and after the injection was given.  Patient was told to come back 28 days for next injection.    Sterile water 1.66ml  lot # [id number] exp 01-2024 was injected into the Abilify Maintena 300mg  . Lot # [id number] exp 04-2024.  Shaken well and 1.5 ml was given to patient without any issues.    Abilify maintena 300mg   NDC# 54982-641-58  GTIN#  [id number]  nov 2025  Lot # [id number]

## 2021-12-19 ENCOUNTER — Telehealth: Payer: Self-pay | Admitting: Psychiatry

## 2021-12-19 NOTE — Telephone Encounter (Signed)
Reviewed record of comprehensive clinical psychological evaluation. Dx- schizophrenia. "His cognitive abilities appear to be in the borderline to low average range of functioning." The form will be scanned.

## 2021-12-25 ENCOUNTER — Encounter: Payer: Self-pay | Admitting: Nurse Practitioner

## 2022-01-05 ENCOUNTER — Ambulatory Visit: Payer: No Typology Code available for payment source

## 2022-01-06 ENCOUNTER — Ambulatory Visit (INDEPENDENT_AMBULATORY_CARE_PROVIDER_SITE_OTHER): Payer: No Typology Code available for payment source

## 2022-01-06 DIAGNOSIS — F259 Schizoaffective disorder, unspecified: Secondary | ICD-10-CM

## 2022-01-06 NOTE — Patient Instructions (Signed)
Pt was given the injection in the left deltoid.  Patient states he has been doing good on the dosage.  Patient has not had any thought of hurting himself or anyone else. Patient did fine during and after the injection.  Patient was told to come back 28 days for next injection.    Sterile water 1.39ml  lot # [id number] exp 01-2024 was injected into the Abilify Maintena 300mg  . Lot # [id number] exp 04-2024.  Shaken well and 1.5 ml was given to patient without any issues.    Abilify maintena 300mg   NDC# 99357-017-79  GTIN#  [id number]  nov 2025  Lot # [id number]

## 2022-01-31 NOTE — Progress Notes (Unsigned)
BH MD/PA/NP OP Progress Note  02/02/2022 11:54 AM Manuel Horn  MRN:  [id number]  Chief Complaint:  Chief Complaint  Patient presents with   Follow-up   HPI:  This is a follow-up appointment for schizoaffective disorder.  (He played something on cell phone, and watches it when the interview is started) He states that he has been doing "alright."  He goes to sore at times.  He goes to church a few times a week.  Although he does not talk with anybody, he just goes there to listen.  When he is asked if he does anything else during the day, he states that "that's all I do." He may walk around the house.  He does not know about the plan for holiday season, although he agrees that there may be some family gathering.  He reports good relationship with his parents.  He sleeps well.  He occasionally takes trazodone.  He denies feeling depressed or anxious.  He denies SI, HI, hallucinations.  He denies paranoia or ideas of reference.  He denies irritability.  He has decrease in appetite, which he attributes this to smoking.  Although he smokes "when I can," he is not interested in in pharmacological treatment.  He feels jittery when he does not smoke.  He denies alcohol use or drug use.    Wt Readings from Last 3 Encounters:  02/02/22 153 lb (69.4 kg)  01/06/22 148 lb 12.8 oz (67.5 kg)  12/09/21 151 lb 12.8 oz (68.9 kg)    Visit Diagnosis:    ICD-10-CM   1. Schizoaffective disorder, unspecified type (HCC)  F25.9     2. Insomnia, unspecified type  G47.00       Past Psychiatric History: Please see initial evaluation for full details. I have reviewed the history. No updates at this time.     Past Medical History:  Past Medical History:  Diagnosis Date   Schizoaffective disorder (HCC)     Past Surgical History:  Procedure Laterality Date   CHOLECYSTECTOMY     ENDOSCOPIC RETROGRADE CHOLANGIOPANCREATOGRAPHY (ERCP) WITH PROPOFOL N/A 01/20/2020   Procedure: ENDOSCOPIC RETROGRADE  CHOLANGIOPANCREATOGRAPHY (ERCP) WITH PROPOFOL;  Surgeon: Midge Minium, MD;  Location: [facility] ENDOSCOPY;  Service: Endoscopy;  Laterality: N/A;   ERCP N/A 02/17/2020   Procedure: ENDOSCOPIC RETROGRADE CHOLANGIOPANCREATOGRAPHY (ERCP);  Surgeon: Midge Minium, MD;  Location: [facility] ENDOSCOPY;  Service: Endoscopy;  Laterality: N/A;   IR PERCUTANEOUS TRANSHEPATIC CHOLANGIOGRAM  01/21/2020   LEG SURGERY Left     Family Psychiatric History: Please see initial evaluation for full details. I have reviewed the history. No updates at this time.     Family History:  Family History  Problem Relation Age of Onset   Diabetes Mother    Hypertension Mother    Depression Maternal Aunt    Schizophrenia Maternal Uncle     Social History:  Social History   Socioeconomic History   Marital status: Single    Spouse name: Not on file   Number of children: 0   Years of education: Not on file   Highest education level: Not on file  Occupational History   Occupation: disability  Tobacco Use   Smoking status: Every Day    Packs/day: 2.00    Types: Cigarettes   Smokeless tobacco: Never   Tobacco comments:    1.5 packs a day  Vaping Use   Vaping Use: Never used  Substance and Sexual Activity   Alcohol use: No   Drug use: No  Sexual activity: Never  Other Topics Concern   Not on file  Social History Narrative   Not on file   Social Determinants of Health   Financial Resource Strain: Not on file  Food Insecurity: Not on file  Transportation Needs: Not on file  Physical Activity: Not on file  Stress: Not on file  Social Connections: Not on file    Allergies:  Allergies  Allergen Reactions   Sulfa Antibiotics Rash    Metabolic Disorder Labs: Lab Results  Component Value Date   HGBA1C 5.7 (A) 12/22/2019   No results found for: "PROLACTIN" Lab Results  Component Value Date   CHOL 116 11/01/2021   TRIG 94 11/01/2021   HDL 27 (L) 11/01/2021   CHOLHDL 4.3 11/01/2021   LDLCALC 71  11/01/2021   LDLCALC 83 11/16/2020   Lab Results  Component Value Date   TSH 1.650 11/01/2021   TSH 2.780 11/16/2020    Therapeutic Level Labs: No results found for: "LITHIUM" No results found for: "VALPROATE" No results found for: "CBMZ"  Current Medications: Current Outpatient Medications  Medication Sig Dispense Refill   ARIPiprazole ER (ABILIFY MAINTENA) 300 MG SRER injection Inject 1.5 mLs (300 mg total) into the muscle every 28 (twenty-eight) days for 6 doses. 1.5 mL 5   traZODone (DESYREL) 50 MG tablet Take 0.5-1 tablets (25-50 mg total) by mouth at bedtime. 30 tablet 2   Current Facility-Administered Medications  Medication Dose Route Frequency Provider Last Rate Last Admin   [START ON 02/06/2022] ARIPiprazole ER (ABILIFY MAINTENA) 300 MG prefilled syringe 300 mg  300 mg Intramuscular Q28 days Neysa Hotter, MD         Musculoskeletal: Strength & Muscle Tone: within normal limits Gait & Station: normal Patient leans: N/A  Psychiatric Specialty Exam: Review of Systems  Psychiatric/Behavioral:  Negative for agitation, behavioral problems, confusion, decreased concentration, dysphoric mood, hallucinations, self-injury, sleep disturbance and suicidal ideas. The patient is not nervous/anxious and is not hyperactive.   All other systems reviewed and are negative.   Blood pressure 128/64, temperature 97.9 F (36.6 C), temperature source Temporal, height 6' (1.829 m), weight 153 lb (69.4 kg).Body mass index is 20.75 kg/m.  General Appearance: Fairly Groomed  Eye Contact:  Fair looking down, watching cell phone. He will get his heads up when he is redirected.   Speech:  Clear and Coherent  Volume:  Normal  Mood:   "fine"  Affect:  Appropriate, Congruent, and Restricted  Thought Process:  Coherent  Orientation:  Full (Time, Place, and Person)  Thought Content: Logical   Suicidal Thoughts:  No  Homicidal Thoughts:  No  Memory:  Immediate;   Good  Judgement:  Good   Insight:  Present  Psychomotor Activity:  Normal no rigidity, no tremors, no dyskinesia  Concentration:  Concentration: Good and Attention Span: Good  Recall:  Good  Fund of Knowledge: Good  Language: Good  Akathisia:  No  Handed:  Right  AIMS (if indicated): not done  Assets:  Communication Skills Desire for Improvement  ADL's:  Intact  Cognition: WNL  Sleep:  Good   Screenings: GAD-7    Flowsheet Row Office Visit from 11/10/2021 in [facility] Office Visit from 10/11/2021 in [facility] Office Visit from 08/09/2021 in [facility]  Total GAD-7 Score 2 0 2      PHQ2-9    Flowsheet Row Office Visit from 11/10/2021 in [facility] Office Visit from 10/31/2021 in [facility] Office Visit from 10/11/2021 in [facility] Office Visit from 09/13/2021 in [facility] Office Visit from 08/09/2021 in [facility]  PHQ-2 Total Score 0 0 0 1 0  PHQ-9 Total Score 3 -- 2 4 --      Flowsheet Row Office Visit from 11/10/2021 in [facility]  C-SSRS RISK CATEGORY No Risk        Assessment and Plan:  Manuel Horn is a 41 y.o. year old male with a history of  schizoaffective disorder, who presents for follow up appointment for below.   1. Schizoaffective disorder, unspecified type (HCC) Although he does not elaborate the story, he is calm through the entire interview, which has been consistent since initial visit.  He continues to go to church, and stores regularly.  He reports good relationship with his parents.  He was reportedly doing very well until high school, where he had an episode of aggression in the context of substance use (cocaine, marijuana).  Will continue Abilify injection to target schizoaffective disorder.   2. Insomnia, unspecified  type Improving.  Will continue current dose of trazodone as needed for insomnia.    Plan Continue Abilify 300 mg IM every 28 days. Continue Trazodone 25-50 mg at night - he declined a refill Next appointment:  3/21 at 11:30 for 30 mins, in person     The patient demonstrates the following risk factors for suicide: Chronic risk factors for suicide include: psychiatric disorder of schizoaffective disorder . Acute risk factors for suicide include: unemployment. Protective factors for this patient include: positive social support. Considering these factors, the overall suicide risk at this point appears to be low. Patient is appropriate for outpatient follow up.           Collaboration of Care: Collaboration of Care: Other reviewed notes in Epic  Patient/Guardian was advised Release of Information must be obtained prior to any record release in order to collaborate their care with an outside provider. Patient/Guardian was advised if they have not already done so to contact the registration department to sign all necessary forms in order for [place] to release information regarding their care.   Consent: Patient/Guardian gives verbal consent for treatment and assignment of benefits for services provided during this visit. Patient/Guardian expressed understanding and agreed to proceed.    Neysa Hotter, MD 02/02/2022, 11:54 AM

## 2022-02-02 ENCOUNTER — Encounter: Payer: Self-pay | Admitting: Psychiatry

## 2022-02-02 ENCOUNTER — Ambulatory Visit (INDEPENDENT_AMBULATORY_CARE_PROVIDER_SITE_OTHER): Payer: No Typology Code available for payment source | Admitting: Psychiatry

## 2022-02-02 VITALS — BP 128/64 | Temp 97.9°F | Ht 72.0 in | Wt 153.0 lb

## 2022-02-02 DIAGNOSIS — G4700 Insomnia, unspecified: Secondary | ICD-10-CM

## 2022-02-02 DIAGNOSIS — F259 Schizoaffective disorder, unspecified: Secondary | ICD-10-CM | POA: Diagnosis not present

## 2022-02-02 MED ORDER — TRAZODONE HCL 50 MG PO TABS: 25.0000 mg | ORAL_TABLET | Freq: Every day | ORAL | 2 refills | Status: DC

## 2022-02-02 MED ORDER — ARIPIPRAZOLE ER 300 MG IM SRER: 300.0000 mg | INTRAMUSCULAR | 5 refills | Status: DC

## 2022-02-02 MED ORDER — ARIPIPRAZOLE ER 300 MG IM PRSY
300.0000 mg | PREFILLED_SYRINGE | INTRAMUSCULAR | Status: AC
  Administered 2022-02-06 – 2022-06-29 (×6): 300 mg via INTRAMUSCULAR

## 2022-02-02 NOTE — Patient Instructions (Signed)
Continue Abilify 300 mg IM every 28 days. Continue Trazodone 25-50 mg at night - he declined a refill Next appointment:  3/21 at 11:3

## 2022-02-06 ENCOUNTER — Ambulatory Visit (INDEPENDENT_AMBULATORY_CARE_PROVIDER_SITE_OTHER): Payer: No Typology Code available for payment source

## 2022-02-06 VITALS — BP 121/73 | HR 71 | Temp 97.9°F | Ht 72.0 in | Wt 150.0 lb

## 2022-02-06 DIAGNOSIS — F258 Other schizoaffective disorders: Secondary | ICD-10-CM

## 2022-02-06 DIAGNOSIS — F259 Schizoaffective disorder, unspecified: Secondary | ICD-10-CM

## 2022-02-06 NOTE — Patient Instructions (Signed)
Pt was given the injection in the right deltoid.  Patient states he has been doing good on the dosage.  Patient has not had any thought of hurting himself or anyone else. Patient did fine during and after the injection.  Patient was told to come back 28 days for next injection.    Sterile water 1.55ml  lot # [id number] exp 01-2024 was injected into the Abilify Maintena 300mg  . Lot # exp 04-2024.  Shaken well and 1.5 ml was given to patient without any issues.    Abilify maintena 300mg   NDC# 11-[birth year]-2002  GTIN#  SN [id number] EXP  nov 2025  Lot # [id number]

## 2022-03-03 ENCOUNTER — Other Ambulatory Visit: Payer: Self-pay | Admitting: Psychiatry

## 2022-03-08 ENCOUNTER — Encounter: Payer: Self-pay | Admitting: *Deleted

## 2022-03-08 ENCOUNTER — Ambulatory Visit (INDEPENDENT_AMBULATORY_CARE_PROVIDER_SITE_OTHER): Payer: No Typology Code available for payment source | Admitting: *Deleted

## 2022-03-08 VITALS — BP 122/74 | HR 76 | Ht 72.0 in | Wt 154.0 lb

## 2022-03-08 DIAGNOSIS — F259 Schizoaffective disorder, unspecified: Secondary | ICD-10-CM

## 2022-03-08 NOTE — Patient Instructions (Signed)
PATIENT ARRIVED FOR HIS MONTHLY INJECTIONARIPiprazole ER (ABILIFY MAINTENA) 300 MG -- 1st  ENCOUNTER WITH PATIENT & VERY PLEASANT. PATIENT INQUIRED ABOUT GETTING A FLU SHOT. GAVE PATIENT A MASK & DIRECTED TO Rx's THAT WOULD BE ABLE TO GIVE HIM A FLU SHOT. PATIENT TOLERATED INJECTION WELL IN LEFT ARM. NO AI/SI  NOR  VH/AH.

## 2022-03-08 NOTE — Progress Notes (Signed)
PATIENT ARRIVED FOR HIS MONTHLY INJECTIONARIPiprazole ER (ABILIFY MAINTENA) 300 MG -- 1st  ENCOUNTER WITH PATIENT & VERY PLEASANT. PATIENT INQUIRED ABOUT GETTING A FLU SHOT. GAVE PATIENT A MASK & DIRECTED TO Rx's THAT WOULD BE ABLE TO GIVE HIM A FLU SHOT. PATIENT TOLERATED INJECTION WELL IN LEFT ARM. NO AI/SI  NOR  VH/AH. 

## 2022-04-05 ENCOUNTER — Ambulatory Visit: Payer: No Typology Code available for payment source

## 2022-04-06 ENCOUNTER — Ambulatory Visit: Payer: No Typology Code available for payment source

## 2022-04-10 ENCOUNTER — Ambulatory Visit: Payer: No Typology Code available for payment source

## 2022-04-10 VITALS — BP 113/75 | HR 78 | Temp 97.8°F | Ht 72.0 in | Wt 151.6 lb

## 2022-04-10 DIAGNOSIS — F259 Schizoaffective disorder, unspecified: Secondary | ICD-10-CM

## 2022-04-10 NOTE — Progress Notes (Signed)
Pt was given the injection in the left deltoid.  Patient states he has been doing good on the dosage.  Patient has not had any thought of hurting himself or anyone else. Patient did fine during and after the injection.  Patient was told to come back 28 days for next injection.    Sterile water 1.5ml  lot # [id number] exp 05-2024 was injected into the Abilify Maintena 300mg . Lot # [id number] exp 06-2024.  Shaken well and 1.5 ml was given to patient without any issues.    Abilify maintena 300mg  NDC# 59148-018-71  GTIN#  [id number] SN [id number]  05-18-24  Lot # [id number]        

## 2022-04-10 NOTE — Patient Instructions (Signed)
Pt was given the injection in the left deltoid.  Patient states he has been doing good on the dosage.  Patient has not had any thought of hurting himself or anyone else. Patient did fine during and after the injection.  Patient was told to come back 28 days for next injection.    Sterile water 1.25ml  lot # [id number] exp 05-2024 was injected into the Abilify Maintena 300mg  . Lot # [id number].  Shaken well and 1.5 ml was given to patient without any issues.    Abilify maintena 300mg   NDC# 72094-709-62  GTIN#  [id number] SN [id number]  05-18-24  Lot # [id number]

## 2022-04-12 ENCOUNTER — Encounter: Payer: Self-pay | Admitting: Nurse Practitioner

## 2022-04-12 ENCOUNTER — Ambulatory Visit (INDEPENDENT_AMBULATORY_CARE_PROVIDER_SITE_OTHER): Payer: Medicaid Other | Admitting: Nurse Practitioner

## 2022-04-12 VITALS — BP 121/69 | HR 75 | Temp 97.3°F | Resp 16 | Ht 72.0 in | Wt 150.6 lb

## 2022-04-12 DIAGNOSIS — R17 Unspecified jaundice: Secondary | ICD-10-CM

## 2022-04-12 DIAGNOSIS — E782 Mixed hyperlipidemia: Secondary | ICD-10-CM | POA: Diagnosis not present

## 2022-04-12 DIAGNOSIS — D509 Iron deficiency anemia, unspecified: Secondary | ICD-10-CM | POA: Diagnosis not present

## 2022-04-12 DIAGNOSIS — F259 Schizoaffective disorder, unspecified: Secondary | ICD-10-CM | POA: Diagnosis not present

## 2022-04-12 NOTE — Progress Notes (Signed)
[facility] [place], [place] [address]  Internal MEDICINE  Office Visit Note  Patient Name: Manuel Horn  [id number]  [id number]  Date of Service: 04/12/2022  Chief Complaint  Patient presents with   Acute Visit    Yellow eyes.      HPI Manuel Horn presents for an acute sick visit for yellowing of the sclera of the eyes.  Taking abilify maintena and does have labs checked periodically to monitor liver and kidney function.  No other symptoms or issues today.  Due to repeat some labs also    Current Medication:  Outpatient Encounter Medications as of 04/12/2022  Medication Sig   ABILIFY MAINTENA 300 MG SRER injection INJECT 1.5 MLS (300 MG TOTAL) INTO THE MUSCLE EVERY 28 (TWENTY-EIGHT) DAYS FOR 6 DOSES.   traZODone (DESYREL) 50 MG tablet Take 0.5-1 tablets (25-50 mg total) by mouth at bedtime.   Facility-Administered Encounter Medications as of 04/12/2022  Medication   ARIPiprazole ER (ABILIFY MAINTENA) 300 MG prefilled syringe 300 mg      Medical History: Past Medical History:  Diagnosis Date   Schizoaffective disorder (HCC)      Vital Signs: BP 121/69   Pulse 75   Temp (!) 97.3 F (36.3 C)   Resp 16   Ht 6' (1.829 m)   Wt 150 lb 9.6 oz (68.3 kg)   SpO2 100%   BMI 20.43 kg/m    Review of Systems  Constitutional: Negative.  Negative for chills, fatigue and unexpected weight change.  HENT:  Negative for congestion, rhinorrhea, sneezing and sore throat.   Eyes:  Negative for redness.       Yellow eyes   Respiratory: Negative.  Negative for cough, chest tightness, shortness of breath and wheezing.   Cardiovascular: Negative.  Negative for chest pain and palpitations.  Gastrointestinal: Negative.  Negative for abdominal pain, constipation, diarrhea, nausea and vomiting.  Genitourinary:  Negative for dysuria and frequency.  Musculoskeletal: Negative.  Negative for arthralgias, back pain, joint swelling and neck pain.  Skin:  Negative for  rash.  Neurological: Negative.  Negative for tremors and numbness.  Hematological: Negative.  Negative for adenopathy. Does not bruise/bleed easily.  Psychiatric/Behavioral:  Negative for behavioral problems (Depression), sleep disturbance and suicidal ideas. The patient is not nervous/anxious.     Physical Exam Vitals reviewed.  Constitutional:      General: He is not in acute distress.    Appearance: Normal appearance. He is normal weight. He is not ill-appearing.  HENT:     Head: Normocephalic and atraumatic.  Eyes:     General: Scleral icterus present.     Pupils: Pupils are equal, round, and reactive to light.  Cardiovascular:     Rate and Rhythm: Normal rate and regular rhythm.  Pulmonary:     Effort: Pulmonary effort is normal. No respiratory distress.  Neurological:     Mental Status: He is alert and oriented to person, place, and time.  Psychiatric:        Mood and Affect: Mood normal.        Behavior: Behavior normal.       Assessment/Plan: 1. Scleral icterus Repeat lab to check liver function  - CMP14+EGFR  2. Mixed hyperlipidemia Repeat lipid panel - Lipid Profile  3. Iron deficiency anemia, unspecified iron deficiency anemia type Repeat CBC - CBC with Differential/Platelet  4. Schizoaffective disorder, unspecified type ([place]) Stable, takes abilify maintena   General Counseling: Manuel Horn understanding of the findings of  todays visit and agrees with plan of treatment. I have discussed any further diagnostic evaluation that may be needed or ordered today. We also reviewed his medications today. he has been encouraged to call the office with any questions or concerns that should arise related to todays visit.    Counseling:    Orders Placed This Encounter  Procedures   CBC with Differential/Platelet   Lipid Profile   CMP14+EGFR    No orders of the defined types were placed in this encounter.   Return if symptoms worsen or fail to  improve, for and also routine follow up/annual physical. .  [place] Controlled Substance Database was reviewed by me for overdose risk score (ORS)  Time spent:30 Minutes Time spent with patient included reviewing progress notes, labs, imaging studies, and discussing plan for follow up.   This patient was seen by Jonetta Osgood, FNP-C in collaboration with Dr. Clayborn Bigness as a part of collaborative care agreement.  Jayleena Stille R. Valetta Fuller, MSN, FNP-C Internal Medicine

## 2022-04-13 LAB — CMP14+EGFR
ALT: 15 IU/L (ref 0–44)
AST: 22 IU/L (ref 0–40)
Albumin/Globulin Ratio: 1.4 (ref 1.2–2.2)
Albumin: 4.2 g/dL (ref 4.1–5.1)
Alkaline Phosphatase: 73 IU/L (ref 44–121)
BUN/Creatinine Ratio: 6 — ABNORMAL LOW (ref 9–20)
BUN: 6 mg/dL (ref 6–24)
Bilirubin Total: 0.3 mg/dL (ref 0.0–1.2)
CO2: 26 mmol/L (ref 20–29)
Calcium: 9.6 mg/dL (ref 8.7–10.2)
Chloride: 100 mmol/L (ref 96–106)
Creatinine, Ser: 1.05 mg/dL (ref 0.76–1.27)
Globulin, Total: 3 g/dL (ref 1.5–4.5)
Glucose: 89 mg/dL (ref 70–99)
Potassium: 4.7 mmol/L (ref 3.5–5.2)
Sodium: 138 mmol/L (ref 134–144)
Total Protein: 7.2 g/dL (ref 6.0–8.5)
eGFR: 91 mL/min/{1.73_m2} (ref >59)

## 2022-04-13 LAB — CBC WITH DIFFERENTIAL/PLATELET
Basophils Absolute: 0 10*3/uL (ref 0.0–0.2)
Basos: 0 %
EOS (ABSOLUTE): 0.1 10*3/uL (ref 0.0–0.4)
Eos: 1 %
Hematocrit: 40 % (ref 37.5–51.0)
Hemoglobin: 13.6 g/dL (ref 13.0–17.7)
Immature Grans (Abs): 0 10*3/uL (ref 0.0–0.1)
Immature Granulocytes: 0 %
Lymphocytes Absolute: 3.6 10*3/uL — ABNORMAL HIGH (ref 0.7–3.1)
Lymphs: 41 %
MCH: 34.3 pg — ABNORMAL HIGH (ref 26.6–33.0)
MCHC: 34 g/dL (ref 31.5–35.7)
MCV: 101 fL — ABNORMAL HIGH (ref 79–97)
Monocytes Absolute: 0.5 10*3/uL (ref 0.1–0.9)
Monocytes: 6 %
Neutrophils Absolute: 4.5 10*3/uL (ref 1.4–7.0)
Neutrophils: 52 %
Platelets: 214 10*3/uL (ref 150–450)
RBC: 3.96 x10E6/uL — ABNORMAL LOW (ref 4.14–5.80)
RDW: 11.9 % (ref 11.6–15.4)
WBC: 8.7 10*3/uL (ref 3.4–10.8)

## 2022-04-13 LAB — LIPID PANEL
Chol/HDL Ratio: 3.5 ratio (ref 0.0–5.0)
Cholesterol, Total: 120 mg/dL (ref 100–199)
HDL: 34 mg/dL — ABNORMAL LOW (ref >39)
LDL Chol Calc (NIH): 74 mg/dL (ref 0–99)
Triglycerides: 53 mg/dL (ref 0–149)
VLDL Cholesterol Cal: 12 mg/dL (ref 5–40)

## 2022-04-17 ENCOUNTER — Telehealth: Payer: Self-pay

## 2022-04-20 ENCOUNTER — Telehealth: Payer: Self-pay

## 2022-04-20 NOTE — Telephone Encounter (Signed)
Called patient but can't leave message as mailbox Is full.

## 2022-04-21 NOTE — Telephone Encounter (Signed)
Done

## 2022-05-01 ENCOUNTER — Encounter: Payer: Self-pay | Admitting: Nurse Practitioner

## 2022-05-01 ENCOUNTER — Ambulatory Visit: Payer: Medicaid Other | Admitting: Nurse Practitioner

## 2022-05-01 VITALS — BP 118/68 | HR 65 | Temp 97.9°F | Resp 16 | Ht 72.0 in | Wt 151.8 lb

## 2022-05-01 DIAGNOSIS — R748 Abnormal levels of other serum enzymes: Secondary | ICD-10-CM

## 2022-05-01 NOTE — Progress Notes (Signed)
[facility] [place], [place] [address]  Internal MEDICINE  Office Visit Note  Patient Name: Manuel Horn  [id number]  [id number]  Date of Service: 05/01/2022  Chief Complaint  Patient presents with   Follow-up    HPI Manuel Horn presents for a follow up visit for lab results.  Liver function testing was normal Oxygen level was 94% on room air. Typically has very cold hands and is difficult to get his oxygen saturation from the pulse oximeter.       Current Medication: Outpatient Encounter Medications as of 05/01/2022  Medication Sig   ABILIFY MAINTENA 300 MG SRER injection INJECT 1.5 MLS (300 MG TOTAL) INTO THE MUSCLE EVERY 28 (TWENTY-EIGHT) DAYS FOR 6 DOSES.   traZODone (DESYREL) 50 MG tablet Take 0.5-1 tablets (25-50 mg total) by mouth at bedtime.   Facility-Administered Encounter Medications as of 05/01/2022  Medication   ARIPiprazole ER (ABILIFY MAINTENA) 300 MG prefilled syringe 300 mg    Surgical History: Past Surgical History:  Procedure Laterality Date   CHOLECYSTECTOMY     ENDOSCOPIC RETROGRADE CHOLANGIOPANCREATOGRAPHY (ERCP) WITH PROPOFOL N/A 01/20/2020   Procedure: ENDOSCOPIC RETROGRADE CHOLANGIOPANCREATOGRAPHY (ERCP) WITH PROPOFOL;  Surgeon: Lucilla Lame, MD;  Location: [facility] ENDOSCOPY;  Service: Endoscopy;  Laterality: N/A;   ERCP N/A 02/17/2020   Procedure: ENDOSCOPIC RETROGRADE CHOLANGIOPANCREATOGRAPHY (ERCP);  Surgeon: Lucilla Lame, MD;  Location: [facility] ENDOSCOPY;  Service: Endoscopy;  Laterality: N/A;   IR PERCUTANEOUS TRANSHEPATIC CHOLANGIOGRAM  01/21/2020   LEG SURGERY Left     Medical History: Past Medical History:  Diagnosis Date   Schizoaffective disorder ([place])     Family History: Family History  Problem Relation Age of Onset   Diabetes Mother    Hypertension Mother    Depression Maternal Aunt    Schizophrenia Maternal Uncle     Social History   Socioeconomic History   Marital status: Single    Spouse name: Not on  file   Number of children: 0   Years of education: Not on file   Highest education level: Not on file  Occupational History   Occupation: disability  Tobacco Use   Smoking status: Every Day    Packs/day: 2.00    Types: Cigarettes   Smokeless tobacco: Never   Tobacco comments:    1.5 packs a day  Vaping Use   Vaping Use: Never used  Substance and Sexual Activity   Alcohol use: No   Drug use: No   Sexual activity: Never  Other Topics Concern   Not on file  Social History Narrative   Not on file   Social Determinants of Health   Financial Resource Strain: Not on file  Food Insecurity: Not on file  Transportation Needs: Not on file  Physical Activity: Not on file  Stress: Not on file  Social Connections: Not on file  Intimate Partner Violence: Not on file      Review of Systems  Constitutional:  Negative for appetite change, chills, fatigue and fever.  HENT: Negative.    Respiratory: Negative.  Negative for cough, chest tightness, shortness of breath and wheezing.   Cardiovascular:  Negative for chest pain and palpitations.  Genitourinary: Negative.   Musculoskeletal: Negative.     Vital Signs: BP 118/68   Pulse 65   Temp 97.9 F (36.6 C)   Resp 16   Ht 6' (1.829 m)   Wt 151 lb 12.8 oz (68.9 kg)   BMI 20.59 kg/m    Physical Exam Vitals reviewed.  Constitutional:      General: He is not in acute distress.    Appearance: Normal appearance. He is normal weight. He is not ill-appearing.  HENT:     Head: Normocephalic and atraumatic.  Eyes:     General: No scleral icterus.    Pupils: Pupils are equal, round, and reactive to light.  Cardiovascular:     Rate and Rhythm: Normal rate and regular rhythm.  Pulmonary:     Effort: Pulmonary effort is normal. No respiratory distress.  Neurological:     Mental Status: He is alert and oriented to person, place, and time.  Psychiatric:        Mood and Affect: Mood normal.        Behavior: Behavior normal.         Assessment/Plan: 1. Elevated liver enzymes Scleral icterus not presents today. Lab results were normal for liver function. They have been elevated in the past and his current medications can affect his liver enzymes. Will continue to monitor his labs and symptoms annually and as needed.    General Counseling: Manuel Horn understanding of the findings of todays visit and agrees with plan of treatment. I have discussed any further diagnostic evaluation that may be needed or ordered today. We also reviewed his medications today. he has been encouraged to call the office with any questions or concerns that should arise related to todays visit.    No orders of the defined types were placed in this encounter.   No orders of the defined types were placed in this encounter.   Return for previously scheduled, CPE, Manuel Horn PCP in august.   Total time spent:20 Minutes Time spent includes review of chart, medications, test results, and follow up plan with the patient.   [place] Controlled Substance Database was reviewed by me.  This patient was seen by Manuel Osgood, FNP-C in collaboration with Manuel Horn as a part of collaborative care agreement.   Manuel Kirchman R. Valetta Fuller, MSN, FNP-C Internal medicine

## 2022-05-04 ENCOUNTER — Ambulatory Visit: Payer: No Typology Code available for payment source

## 2022-05-04 VITALS — BP 107/76 | HR 66 | Temp 98.7°F | Ht 72.0 in | Wt 150.8 lb

## 2022-05-04 DIAGNOSIS — F259 Schizoaffective disorder, unspecified: Secondary | ICD-10-CM

## 2022-05-04 NOTE — Psych (Cosign Needed)
Pt was given the injection in the left deltoid.  Patient states he has been doing good on the dosage.  Patient has not had any thought of hurting himself or anyone else. Patient did fine during and after the injection.  Patient was told to come back 28 days for next injection.    Sterile water 1.22m  lot # [id number] 05-2024 was injected into the Abilify Maintena 307m. Lot # [id number] Shaken well and 1.5 ml was given to patient without any issues.    Abilify maintena 30063mNDC# [id number]#  [id number]  05-18-24  Lot # [id number]

## 2022-06-01 ENCOUNTER — Ambulatory Visit: Payer: No Typology Code available for payment source

## 2022-06-01 VITALS — BP 106/72 | HR 65 | Temp 97.3°F | Ht 72.0 in | Wt 151.4 lb

## 2022-06-01 DIAGNOSIS — F259 Schizoaffective disorder, unspecified: Secondary | ICD-10-CM | POA: Diagnosis not present

## 2022-06-01 NOTE — Psych (Cosign Needed)
Pt was given the injection in the right deltoid.  Patient states he has been doing good on the dosage.  Patient has not had any thought of hurting himself or anyone else. Patient did fine during and after the injection.  Patient was told to come back 28 days for next injection.    Sterile water 1.3m  lot # [id number] 05-2024 was injected into the Abilify Maintena '300mg'$  . Lot # [id number]  Shaken well and 1.5 ml was given to patient without any issues.    Abilify maintena '300mg'$   NDC# [id number] GTIN#  [id number]  06-17-24  Lot # [id number]

## 2022-06-02 ENCOUNTER — Other Ambulatory Visit: Payer: Self-pay | Admitting: Nurse Practitioner

## 2022-06-03 LAB — LIPID PANEL
Chol/HDL Ratio: 3.8 ratio (ref 0.0–5.0)
Cholesterol, Total: 133 mg/dL (ref 100–199)
HDL: 35 mg/dL — ABNORMAL LOW (ref >39)
LDL Chol Calc (NIH): 82 mg/dL (ref 0–99)
Triglycerides: 79 mg/dL (ref 0–149)
VLDL Cholesterol Cal: 16 mg/dL (ref 5–40)

## 2022-06-03 LAB — B12 AND FOLATE PANEL
Folate: 5.9 ng/mL (ref >3.0)
Vitamin B-12: 380 pg/mL (ref 232–1245)

## 2022-06-03 LAB — CBC WITH DIFFERENTIAL/PLATELET
Basophils Absolute: 0 10*3/uL (ref 0.0–0.2)
Basos: 0 %
EOS (ABSOLUTE): 0.1 10*3/uL (ref 0.0–0.4)
Eos: 1 %
Hematocrit: 41.3 % (ref 37.5–51.0)
Hemoglobin: 14.3 g/dL (ref 13.0–17.7)
Immature Grans (Abs): 0 10*3/uL (ref 0.0–0.1)
Immature Granulocytes: 0 %
Lymphocytes Absolute: 3.8 10*3/uL — ABNORMAL HIGH (ref 0.7–3.1)
Lymphs: 38 %
MCH: 34 pg — ABNORMAL HIGH (ref 26.6–33.0)
MCHC: 34.6 g/dL (ref 31.5–35.7)
MCV: 98 fL — ABNORMAL HIGH (ref 79–97)
Monocytes Absolute: 0.6 10*3/uL (ref 0.1–0.9)
Monocytes: 6 %
Neutrophils Absolute: 5.4 10*3/uL (ref 1.4–7.0)
Neutrophils: 55 %
Platelets: 211 10*3/uL (ref 150–450)
RBC: 4.2 x10E6/uL (ref 4.14–5.80)
RDW: 11.8 % (ref 11.6–15.4)
WBC: 9.9 10*3/uL (ref 3.4–10.8)

## 2022-06-03 LAB — COMPREHENSIVE METABOLIC PANEL
ALT: 14 IU/L (ref 0–44)
AST: 24 IU/L (ref 0–40)
Albumin/Globulin Ratio: 1.5 (ref 1.2–2.2)
Albumin: 4.5 g/dL (ref 4.1–5.1)
Alkaline Phosphatase: 88 IU/L (ref 44–121)
BUN/Creatinine Ratio: 13 (ref 9–20)
BUN: 14 mg/dL (ref 6–24)
Bilirubin Total: 0.5 mg/dL (ref 0.0–1.2)
CO2: 25 mmol/L (ref 20–29)
Calcium: 9.4 mg/dL (ref 8.7–10.2)
Chloride: 99 mmol/L (ref 96–106)
Creatinine, Ser: 1.12 mg/dL (ref 0.76–1.27)
Globulin, Total: 3 g/dL (ref 1.5–4.5)
Glucose: 102 mg/dL — ABNORMAL HIGH (ref 70–99)
Potassium: 4.4 mmol/L (ref 3.5–5.2)
Sodium: 137 mmol/L (ref 134–144)
Total Protein: 7.5 g/dL (ref 6.0–8.5)
eGFR: 85 mL/min/{1.73_m2} (ref >59)

## 2022-06-03 LAB — TSH: TSH: 2.54 u[IU]/mL (ref 0.450–4.500)

## 2022-06-03 LAB — VITAMIN D 25 HYDROXY (VIT D DEFICIENCY, FRACTURES): Vit D, 25-Hydroxy: 15.7 ng/mL — ABNORMAL LOW (ref 30.0–100.0)

## 2022-06-03 LAB — T4, FREE: Free T4: 1.13 ng/dL (ref 0.82–1.77)
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
## 2022-06-08 ENCOUNTER — Telehealth: Payer: Self-pay

## 2022-06-08 ENCOUNTER — Ambulatory Visit (INDEPENDENT_AMBULATORY_CARE_PROVIDER_SITE_OTHER): Payer: No Typology Code available for payment source | Admitting: Psychiatry

## 2022-06-08 ENCOUNTER — Encounter: Payer: Self-pay | Admitting: Psychiatry

## 2022-06-08 ENCOUNTER — Other Ambulatory Visit: Payer: Self-pay | Admitting: Nurse Practitioner

## 2022-06-08 VITALS — BP 97/65 | HR 73 | Temp 98.1°F | Ht 72.0 in | Wt 150.8 lb

## 2022-06-08 DIAGNOSIS — G4700 Insomnia, unspecified: Secondary | ICD-10-CM | POA: Diagnosis not present

## 2022-06-08 DIAGNOSIS — F259 Schizoaffective disorder, unspecified: Secondary | ICD-10-CM | POA: Diagnosis not present

## 2022-06-08 MED ORDER — TRAZODONE HCL 50 MG PO TABS: 25.0000 mg | ORAL_TABLET | Freq: Every day | ORAL | 2 refills | Status: DC

## 2022-06-08 MED ORDER — VITAMIN D (ERGOCALCIFEROL) 1.25 MG (50000 UNIT) PO CAPS: 50000.0000 [IU] | ORAL_CAPSULE | ORAL | 1 refills | Status: AC

## 2022-06-08 NOTE — Patient Instructions (Signed)
Continue Abilify 300 mg IM every 28 days. Continue Trazodone 25-50 mg at night  Next appointment:  3/21 at 11:30

## 2022-06-08 NOTE — Progress Notes (Signed)
Please call the patient and let him know that his labs are stable with no significant changes except  --low vitamin D -- needs to take prescription weekly supplement which I have sent to his pharmacy, will repeat vitamin D level in august --low normal vitamin B12  -- recommend taking OTC B12 1000 mcg daily.

## 2022-06-08 NOTE — Telephone Encounter (Signed)
-----   Message from Jonetta Osgood, NP sent at 06/08/2022  7:59 AM EDT ----- Please call the patient and let him know that his labs are stable with no significant changes except  --low vitamin D -- needs to take prescription weekly supplement which I have sent to his pharmacy, will repeat vitamin D level in august --low normal vitamin B12  -- recommend taking OTC B12 1000 mcg daily.

## 2022-06-08 NOTE — Telephone Encounter (Signed)
Patient and his mother were notified.

## 2022-06-29 ENCOUNTER — Other Ambulatory Visit: Payer: Self-pay | Admitting: Psychiatry

## 2022-06-29 ENCOUNTER — Ambulatory Visit: Payer: No Typology Code available for payment source

## 2022-06-29 VITALS — BP 121/71 | HR 79 | Temp 98.1°F | Ht 72.0 in | Wt 152.4 lb

## 2022-06-29 DIAGNOSIS — F259 Schizoaffective disorder, unspecified: Secondary | ICD-10-CM | POA: Diagnosis not present

## 2022-06-29 MED ORDER — ARIPIPRAZOLE ER 300 MG IM PRSY: 300.0000 mg | PREFILLED_SYRINGE | INTRAMUSCULAR | Status: AC

## 2022-06-29 MED ORDER — ARIPIPRAZOLE ER 300 MG IM PRSY
300.0000 mg | PREFILLED_SYRINGE | INTRAMUSCULAR | Status: AC
  Administered 2022-07-31 – 2022-10-25 (×4): 300 mg via INTRAMUSCULAR

## 2022-06-29 NOTE — Progress Notes (Signed)
Pt was given the injection in the right deltoid.  Patient states he has been doing good on the dosage.  Patient has not had any thought of hurting himself or anyone else. Patient did fine during and after the injection.  Patient was told to come back 28 days for next injection.    Sterile water 1.38ml  lot # [id number] exp 05-2024 was injected into the Abilify Maintena 300mg  . Lot # [id number].  Shaken well and 1.5 ml was given to patient without any issues.    Abilify maintena 300mg   NDC# 45859-292-44  GTIN#  [id number] SN [id number] EXP  05-18-24  Lot # [id number]

## 2022-06-29 NOTE — Patient Instructions (Signed)
Pt was given the injection in the right deltoid.  Patient states he has been doing good on the dosage.  Patient has not had any thought of hurting himself or anyone else. Patient did fine during and after the injection.  Patient was told to come back 28 days for next injection.    Sterile water 1.5ml  lot # [id number] exp 05-2024 was injected into the Abilify Maintena 300mg . Lot # [id number] exp 06-2024.  Shaken well and 1.5 ml was given to patient without any issues.    Abilify maintena 300mg  NDC# 59148-018-71  GTIN#  [id number] SN [id number] EXP  05-18-24  Lot # [id number]     

## 2022-07-18 ENCOUNTER — Encounter: Payer: Self-pay | Admitting: Psychiatry

## 2022-07-18 ENCOUNTER — Telehealth: Payer: Self-pay

## 2022-07-18 NOTE — Telephone Encounter (Signed)
pt called states he needs a note to get out of jury duty. pt last seen on 06-08-22 next appt 6-18

## 2022-07-18 NOTE — Telephone Encounter (Signed)
left message that letter was ready for pick up and that a release of information form will also need to be sign. letter and form was put at the front desk for when patient comes in for pick up.

## 2022-07-18 NOTE — Telephone Encounter (Signed)
Letter is printed out. Please provide this to the patient after obtaining ROI.

## 2022-07-27 ENCOUNTER — Ambulatory Visit: Payer: No Typology Code available for payment source

## 2022-07-31 ENCOUNTER — Ambulatory Visit: Payer: No Typology Code available for payment source

## 2022-07-31 VITALS — BP 118/78 | HR 69 | Temp 98.3°F | Ht 72.0 in | Wt 157.8 lb

## 2022-07-31 DIAGNOSIS — F259 Schizoaffective disorder, unspecified: Secondary | ICD-10-CM

## 2022-07-31 NOTE — Patient Instructions (Signed)
Pt was given the injection in the left deltoid.  Patient states he has been doing good on the dosage.  Patient has not had any thought of hurting himself or anyone else. Patient did fine during and after the injection.  Patient was told to come back 28 days for next injection.    Sterile water 1.31ml  lot # [id number] exp 05-2024 was injected into the Abilify Maintena 300mg  . Lot # [id number].  Shaken well and 1.5 ml was given to patient without any issues.    Abilify maintena 300mg   NDC# 16109-604-54  GTIN#  [id number] SN [id number] EXP  05-18-24  Lot # [id number]

## 2022-07-31 NOTE — Progress Notes (Signed)
Pt was given the injection in the left deltoid.  Patient states he has been doing good on the dosage.  Patient has not had any thought of hurting himself or anyone else. Patient did fine during and after the injection.  Patient was told to come back 28 days for next injection.    Sterile water 1.5ml  lot # [id number] exp 05-2024 was injected into the Abilify Maintena 300mg . Lot # [id number] exp 06-2024.  Shaken well and 1.5 ml was given to patient without any issues.    Abilify maintena 300mg  NDC# 59148-018-71  GTIN#  [id number] SN [id number] EXP  05-18-24  Lot # [id number] 

## 2022-08-22 ENCOUNTER — Other Ambulatory Visit: Payer: Self-pay | Admitting: Psychiatry

## 2022-08-28 ENCOUNTER — Telehealth: Payer: Self-pay | Admitting: Psychiatry

## 2022-08-28 ENCOUNTER — Ambulatory Visit: Payer: No Typology Code available for payment source

## 2022-08-28 MED ORDER — ABILIFY MAINTENA 300 MG IM SRER: INTRAMUSCULAR | 5 refills | Status: DC

## 2022-08-28 NOTE — Telephone Encounter (Signed)
pt is here for injection but he states when he went to the pharmacy that they did not have it.

## 2022-08-28 NOTE — Telephone Encounter (Signed)
called the pharmacy and he states that the last rx that was sent wasfrom 11-16 with 5 refills so he is out

## 2022-08-28 NOTE — Telephone Encounter (Signed)
Ordered

## 2022-08-28 NOTE — Telephone Encounter (Signed)
pt was advised to check with the pharmacy later today that a request for refill was sent to dr. Vanetta Shawl to send in.

## 2022-08-30 ENCOUNTER — Ambulatory Visit: Payer: No Typology Code available for payment source

## 2022-08-30 VITALS — BP 110/72 | HR 60 | Temp 97.9°F | Ht 72.0 in | Wt 158.4 lb

## 2022-08-30 DIAGNOSIS — F259 Schizoaffective disorder, unspecified: Secondary | ICD-10-CM | POA: Diagnosis not present

## 2022-08-30 NOTE — Progress Notes (Signed)
Pt was given the injection in the right deltoid.  Patient states he has been doing good on the dosage.  Patient has not had any thought of hurting himself or anyone else. Patient did fine during and after the injection.  Patient was told to come back 28 days for next injection.    Sterile water 1.59ml  lot # [id number] exp 01-2025 was injected into the Abilify Maintena 300mg  . Lot # [id number].  Shaken well and 1.5 ml was given to patient without any issues.    Abilify maintena 300mg   NDC# 69629-528-41  GTIN# [id number] SN [id number] EXP  01-18-25  Lot # [id number]

## 2022-08-30 NOTE — Patient Instructions (Signed)
Pt was given the injection in the right deltoid.  Patient states he has been doing good on the dosage.  Patient has not had any thought of hurting himself or anyone else. Patient did fine during and after the injection.  Patient was told to come back 28 days for next injection.    Sterile water 1.59ml  lot # [id number] exp 01-2025 was injected into the Abilify Maintena 300mg  . Lot # [id number].  Shaken well and 1.5 ml was given to patient without any issues.    Abilify maintena 300mg   NDC# 69629-528-41  GTIN# [id number] SN [id number] EXP  01-18-25  Lot # [id number]
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
## 2022-09-05 ENCOUNTER — Encounter: Payer: Self-pay | Admitting: Psychiatry

## 2022-09-05 ENCOUNTER — Ambulatory Visit (INDEPENDENT_AMBULATORY_CARE_PROVIDER_SITE_OTHER): Payer: No Typology Code available for payment source | Admitting: Psychiatry

## 2022-09-05 VITALS — BP 110/69 | HR 63 | Temp 97.6°F | Ht 72.0 in | Wt 156.4 lb

## 2022-09-05 DIAGNOSIS — F259 Schizoaffective disorder, unspecified: Secondary | ICD-10-CM | POA: Diagnosis not present

## 2022-09-05 DIAGNOSIS — G4700 Insomnia, unspecified: Secondary | ICD-10-CM | POA: Diagnosis not present

## 2022-09-19 ENCOUNTER — Ambulatory Visit
Admission: RE | Admit: 2022-09-19 | Discharge: 2022-09-19 | Disposition: A | Discharge status: Home / Self Care | Payer: MEDICAID | Source: Ambulatory Visit | Attending: *Deleted | Admitting: *Deleted

## 2022-09-19 DIAGNOSIS — R001 Bradycardia, unspecified: Secondary | ICD-10-CM | POA: Diagnosis not present

## 2022-09-19 DIAGNOSIS — F259 Schizoaffective disorder, unspecified: Secondary | ICD-10-CM | POA: Diagnosis not present

## 2022-09-19 DIAGNOSIS — I4510 Unspecified right bundle-branch block: Secondary | ICD-10-CM | POA: Diagnosis not present

## 2022-09-27 ENCOUNTER — Ambulatory Visit: Payer: No Typology Code available for payment source

## 2022-09-27 ENCOUNTER — Ambulatory Visit (INDEPENDENT_AMBULATORY_CARE_PROVIDER_SITE_OTHER): Payer: PRIVATE HEALTH INSURANCE

## 2022-09-27 ENCOUNTER — Telehealth: Payer: Self-pay | Admitting: Psychiatry

## 2022-09-27 VITALS — BP 118/74 | HR 74 | Temp 98.5°F | Ht 72.0 in | Wt 156.4 lb

## 2022-09-27 DIAGNOSIS — F259 Schizoaffective disorder, unspecified: Secondary | ICD-10-CM | POA: Diagnosis not present

## 2022-09-27 NOTE — Progress Notes (Signed)
Pt was given the injection in the left deltoid.  Patient states he has been doing good on the dosage.  Patient has not had any thought of hurting himself or anyone else. Patient did fine during and after the injection.  Patient was told to come back 28 days for next injection.    Sterile water 1.5ml  lot # [id number] exp 01-2025 was injected into the Abilify Maintena 300mg . Lot # [id number] exp 02-2025.  Shaken well and 1.5 ml was given to patient without any issues.    Abilify maintena 300mg  NDC# 59148-018-71  GTIN# [id number]            SN#  [id number] EXP  01-18-25  Lot # [id number]     

## 2022-09-27 NOTE — Telephone Encounter (Signed)
Please call the patient.  I reviewed the EKG. It is recorded as abnormal due to certain findings compared to a general EKG. I recommend continuing the current medication as is. I advise him to contact his primary care provider regarding this EKG to see if any further evaluation is needed, although I do not believe any urgent intervention is needed unless he experiences chest symptoms. Since we do not have any baseline EKG for comparison, reviewing his previous EKG, if available, would be helpful.  EKG HR 59, QTc 455 msec, RBBB, t wave inversion 09/2022

## 2022-09-27 NOTE — Patient Instructions (Addendum)
Pt was given the injection in the left deltoid.  Patient states he has been doing good on the dosage.  Patient has not had any thought of hurting himself or anyone else. Patient did fine during and after the injection.  Patient was told to come back 28 days for next injection.    Sterile water 1.63ml  lot # [id number] exp 01-2025 was injected into the Abilify Maintena 300mg  . Lot # [id number].  Shaken well and 1.5 ml was given to patient without any issues.    Abilify maintena 300mg   NDC# 16109-604-54  GTIN# [id number]            SN#  [id number] EXP  01-18-25  Lot # [id number]

## 2022-09-27 NOTE — Telephone Encounter (Signed)
pt was notified of the instrucations per dr. Vanetta Shawl

## 2022-10-25 ENCOUNTER — Ambulatory Visit (INDEPENDENT_AMBULATORY_CARE_PROVIDER_SITE_OTHER): Payer: 59

## 2022-10-25 VITALS — BP 107/74 | HR 69 | Temp 97.1°F | Ht 72.0 in | Wt 154.4 lb

## 2022-10-25 DIAGNOSIS — F259 Schizoaffective disorder, unspecified: Secondary | ICD-10-CM | POA: Diagnosis not present

## 2022-10-25 NOTE — Patient Instructions (Addendum)
Pt was given the injection in the right deltoid.  Patient states he has been doing good on the dosage.  Patient has not had any thought of hurting himself or anyone else. Patient did fine during and after the injection.  Patient was told to come back 28 days for next injection.    Sterile water 1.78ml  lot # [id number] exp 01-2024 was injected into the Abilify Maintena 300mg  . Lot # [id number] exp [id number].  Shaken well and 1.5 ml was given to patient without any issues.    Abilify maintena 300mg   NDC# 96045-409-81  GTIN# [id number]          SN#  [id number] EXP  10-19-23  Lot # [id number]

## 2022-10-25 NOTE — Progress Notes (Signed)
Pt was given the injection in the right deltoid.  Patient states he has been doing good on the dosage.  Patient has not had any thought of hurting himself or anyone else. Patient did fine during and after the injection.  Patient was told to come back 28 days for next injection.    Sterile water 1.78ml  lot # [id number] exp 01-2024 was injected into the Abilify Maintena 300mg  . Lot # [id number] exp [id number].  Shaken well and 1.5 ml was given to patient without any issues.    Abilify maintena 300mg   NDC# 96045-409-81  GTIN# [id number]          SN#  [id number] EXP  10-19-23  Lot # [id number]

## 2022-11-06 ENCOUNTER — Encounter: Payer: MEDICAID | Admitting: Nurse Practitioner

## 2022-11-10 ENCOUNTER — Ambulatory Visit (INDEPENDENT_AMBULATORY_CARE_PROVIDER_SITE_OTHER): Payer: MEDICAID | Admitting: Nurse Practitioner

## 2022-11-10 ENCOUNTER — Encounter: Payer: Self-pay | Admitting: Nurse Practitioner

## 2022-11-10 VITALS — BP 110/78 | HR 79 | Temp 96.0°F | Resp 16 | Ht 72.0 in | Wt 158.8 lb

## 2022-11-10 DIAGNOSIS — E782 Mixed hyperlipidemia: Secondary | ICD-10-CM

## 2022-11-10 DIAGNOSIS — D509 Iron deficiency anemia, unspecified: Secondary | ICD-10-CM

## 2022-11-10 DIAGNOSIS — E559 Vitamin D deficiency, unspecified: Secondary | ICD-10-CM

## 2022-11-10 DIAGNOSIS — R748 Abnormal levels of other serum enzymes: Secondary | ICD-10-CM

## 2022-11-10 DIAGNOSIS — R7303 Prediabetes: Secondary | ICD-10-CM

## 2022-11-10 DIAGNOSIS — Z0001 Encounter for general adult medical examination with abnormal findings: Secondary | ICD-10-CM | POA: Diagnosis not present

## 2022-11-10 DIAGNOSIS — F259 Schizoaffective disorder, unspecified: Secondary | ICD-10-CM

## 2022-11-10 DIAGNOSIS — R300 Dysuria: Secondary | ICD-10-CM

## 2022-11-10 NOTE — Progress Notes (Signed)
[facility] [address] [place], [place] [address]  Internal MEDICINE  Office Visit Note  Patient Name: Manuel Horn  [id number]  [id number]  Date of Service: 11/10/2022  Chief Complaint  Patient presents with   Annual Exam    HPI Charlene presents for an annual well visit and physical exam.  Well-appearing 42 y.o. male with schizoaffective disorder, gallstones, and tobacco use. RUQ ultrasound done previously for elevated liver enzymes and history of jaundice, with normal results. Has had low vitamin D with prescription replacement with weekly supplement. History of borderline low normal B12. Has also had an elevated A1c in the past, will recheck this.  Labs: due for routine labs  New or worsening pain: none  Other concerns: none  Non significant health or lifestyle changes in the past year.  No change in psych medication.  Lives with family, disabled, not working. Smoker, not interested in smoking cessation.    Current Medication: Outpatient Encounter Medications as of 11/10/2022  Medication Sig   ARIPiprazole ER (ABILIFY MAINTENA) 300 MG SRER injection INJECT 1.5 MLS (300 MG TOTAL) INTO THE MUSCLE EVERY 28 (TWENTY-EIGHT) DAYS FOR 6 DOSES.   Vitamin D, Ergocalciferol, (DRISDOL) 1.25 MG (50000 UNIT) CAPS capsule Take 1 capsule (50,000 Units total) by mouth every 7 (seven) days.   traZODone (DESYREL) 50 MG tablet Take 0.5-1 tablets (25-50 mg total) by mouth at bedtime.   Facility-Administered Encounter Medications as of 11/10/2022  Medication   ARIPiprazole ER (ABILIFY MAINTENA) 300 MG prefilled syringe 300 mg   ARIPiprazole ER (ABILIFY MAINTENA) 300 MG prefilled syringe 300 mg    Surgical History: Past Surgical History:  Procedure Laterality Date   CHOLECYSTECTOMY     ENDOSCOPIC RETROGRADE CHOLANGIOPANCREATOGRAPHY (ERCP) WITH PROPOFOL N/A 01/20/2020   Procedure: ENDOSCOPIC RETROGRADE CHOLANGIOPANCREATOGRAPHY (ERCP) WITH PROPOFOL;  Surgeon: Midge Minium, MD;   Location: [facility] ENDOSCOPY;  Service: Endoscopy;  Laterality: N/A;   ERCP N/A 02/17/2020   Procedure: ENDOSCOPIC RETROGRADE CHOLANGIOPANCREATOGRAPHY (ERCP);  Surgeon: Midge Minium, MD;  Location: [facility] ENDOSCOPY;  Service: Endoscopy;  Laterality: N/A;   IR PERCUTANEOUS TRANSHEPATIC CHOLANGIOGRAM  01/21/2020   LEG SURGERY Left     Medical History: Past Medical History:  Diagnosis Date   Schizoaffective disorder (HCC)     Family History: Family History  Problem Relation Age of Onset   Diabetes Mother    Hypertension Mother    Depression Maternal Aunt    Schizophrenia Maternal Uncle     Social History   Socioeconomic History   Marital status: Single    Spouse name: Not on file   Number of children: 0   Years of education: Not on file   Highest education level: Not on file  Occupational History   Occupation: disability  Tobacco Use   Smoking status: Every Day    Current packs/day: 2.00    Types: Cigarettes   Smokeless tobacco: Never   Tobacco comments:    1.5 packs a day  Vaping Use   Vaping status: Never Used  Substance and Sexual Activity   Alcohol use: No   Drug use: No   Sexual activity: Never  Other Topics Concern   Not on file  Social History Narrative   Not on file   Social Determinants of Health   Financial Resource Strain: Not on file  Food Insecurity: Not on file  Transportation Needs: Not on file  Physical Activity: Not on file  Stress: Not on file  Social Connections: Not on file  Intimate Partner Violence:  Not on file      Review of Systems  Constitutional:  Negative for activity change, appetite change, chills, fatigue, fever and unexpected weight change.  HENT: Negative.  Negative for congestion, ear pain, rhinorrhea, sore throat and trouble swallowing.   Eyes: Negative.   Respiratory: Negative.  Negative for cough, chest tightness, shortness of breath and wheezing.   Cardiovascular: Negative.  Negative for chest pain.  Gastrointestinal:  Negative.  Negative for abdominal pain, blood in stool, constipation, diarrhea, nausea and vomiting.  Endocrine: Negative.   Genitourinary: Negative.  Negative for difficulty urinating, dysuria, frequency, hematuria and urgency.  Musculoskeletal: Negative.  Negative for arthralgias, back pain, joint swelling, myalgias and neck pain.  Skin: Negative.  Negative for rash and wound.  Allergic/Immunologic: Negative.  Negative for immunocompromised state.  Neurological: Negative.  Negative for dizziness, seizures, numbness and headaches.  Hematological: Negative.   Psychiatric/Behavioral: Negative.  Negative for behavioral problems, self-injury and suicidal ideas. The patient is not nervous/anxious.     Vital Signs: BP 110/78   Pulse 79   Temp (!) 96 F (35.6 C)   Resp 16   Ht 6' (1.829 m)   Wt 158 lb 12.8 oz (72 kg)   SpO2 97%   BMI 21.54 kg/m    Physical Exam Vitals reviewed.  Constitutional:      General: He is awake. He is not in acute distress.    Appearance: Normal appearance. He is well-developed, well-groomed and normal weight. He is not ill-appearing or diaphoretic.  HENT:     Head: Normocephalic and atraumatic.     Right Ear: Tympanic membrane, ear canal and external ear normal.     Left Ear: Tympanic membrane, ear canal and external ear normal.     Nose: Nose normal. No congestion or rhinorrhea.     Mouth/Throat:     Lips: Pink.     Mouth: Mucous membranes are moist.     Pharynx: Oropharynx is clear. Uvula midline. No oropharyngeal exudate or posterior oropharyngeal erythema.  Eyes:     General: Lids are normal. Vision grossly intact. Gaze aligned appropriately.        Right eye: No discharge.        Left eye: No discharge.     Extraocular Movements: Extraocular movements intact.     Conjunctiva/sclera: Conjunctivae normal.     Pupils: Pupils are equal, round, and reactive to light.     Funduscopic exam:    Right eye: Red reflex present.        Left eye: Red reflex  present. Neck:     Thyroid: No thyromegaly.     Vascular: No carotid bruit or JVD.     Trachea: Trachea and phonation normal. No tracheal deviation.  Cardiovascular:     Rate and Rhythm: Normal rate and regular rhythm.     Pulses:          Carotid pulses are 3+ on the right side and 3+ on the left side.      Radial pulses are 2+ on the right side and 2+ on the left side.       Dorsalis pedis pulses are 2+ on the right side and 2+ on the left side.       Posterior tibial pulses are 2+ on the right side and 2+ on the left side.     Heart sounds: Normal heart sounds, S1 normal and S2 normal. No murmur heard.    No friction rub. No gallop.  Pulmonary:  Effort: Pulmonary effort is normal. No accessory muscle usage or respiratory distress.     Breath sounds: Normal breath sounds and air entry. No stridor. No wheezing or rales.  Chest:     Chest wall: No tenderness.  Abdominal:     General: Bowel sounds are normal. There is no distension.     Palpations: Abdomen is soft. There is no shifting dullness, fluid wave, mass or pulsatile mass.     Tenderness: There is no abdominal tenderness. There is no guarding or rebound.  Musculoskeletal:        General: No tenderness or deformity. Normal range of motion.     Cervical back: Normal range of motion and neck supple.     Right lower leg: No edema.     Left lower leg: No edema.  Lymphadenopathy:     Cervical: No cervical adenopathy.  Skin:    General: Skin is warm and dry.     Capillary Refill: Capillary refill takes less than 2 seconds.     Coloration: Skin is not pale.     Findings: No erythema or rash.  Neurological:     Mental Status: He is alert and oriented to person, place, and time.     Cranial Nerves: No cranial nerve deficit.     Motor: No abnormal muscle tone.     Coordination: Coordination normal.     Gait: Gait normal.     Deep Tendon Reflexes: Reflexes are normal and symmetric.  Psychiatric:        Mood and Affect: Mood  and affect normal.        Behavior: Behavior normal. Behavior is cooperative.        Thought Content: Thought content normal.        Judgment: Judgment normal.        Assessment/Plan: 1. Encounter for routine adult health examination with abnormal findings Age-appropriate preventive screenings and vaccinations discussed, annual physical exam completed. Routine labs for health maintenance ordered, see below. PHM updated.  - CBC with Differential/Platelet - CMP14+EGFR - Lipid Profile - B12 and Folate Panel - Vitamin D (25 hydroxy) - Hgb A1C w/o eAG  2. Elevated liver enzymes Routine labs ordered  - CBC with Differential/Platelet - CMP14+EGFR - Lipid Profile - Hgb A1C w/o eAG  3. Iron deficiency anemia, unspecified iron deficiency anemia type Repeat labs for anemia.  - CBC with Differential/Platelet - B12 and Folate Panel  4. Pre-diabetes History of prediabetes, will check A1c  - Hgb A1C w/o eAG  5. Vitamin D deficiency Recheck vitamin D level, if still low, will continue weekly supplement.  - Vitamin D (25 hydroxy)  6. Mixed hyperlipidemia Routine labs ordered  - CBC with Differential/Platelet - CMP14+EGFR - Lipid Profile  7. Dysuria Routine urinalysis done  - UA/M w/rflx Culture, Routine  8. Schizoaffective disorder, unspecified type (HCC) Followed by Dr. Vanetta Shawl, takes monthly abilify maintaina 300 mg, he is stable and has been on the same dose for a while.      General Counseling: arpan arnwine understanding of the findings of todays visit and agrees with plan of treatment. I have discussed any further diagnostic evaluation that may be needed or ordered today. We also reviewed his medications today. he has been encouraged to call the office with any questions or concerns that should arise related to todays visit.    Orders Placed This Encounter  Procedures   CBC with Differential/Platelet   CMP14+EGFR   Lipid Profile   B12 and Folate  Panel    Vitamin D (25 hydroxy)   Hgb A1C w/o eAG   UA/M w/rflx Culture, Routine    No orders of the defined types were placed in this encounter.   Return in about 1 year (around 11/10/2023) for CPE, Christen Wardrop PCP and otherwise as needed, will call for appt if multiple abnormals on labs.   Total time spent:30 Minutes Time spent includes review of chart, medications, test results, and follow up plan with the patient.   [place] Controlled Substance Database was reviewed by me.  This patient was seen by Sallyanne Kuster, FNP-C in collaboration with Dr. Beverely Risen as a part of collaborative care agreement.  Donney Caraveo R. Tedd Sias, MSN, FNP-C Internal medicine

## 2022-11-14 LAB — URINE CULTURE, REFLEX

## 2022-11-14 LAB — UA/M W/RFLX CULTURE, ROUTINE
Bilirubin, UA: NEGATIVE
Glucose, UA: NEGATIVE
Ketones, UA: NEGATIVE
Nitrite, UA: NEGATIVE
Protein,UA: NEGATIVE
Specific Gravity, UA: 1.016 (ref 1.005–1.030)
Urobilinogen, Ur: 1 mg/dL (ref 0.2–1.0)
pH, UA: 6 (ref 5.0–7.5)

## 2022-11-14 LAB — MICROSCOPIC EXAMINATION
Bacteria, UA: NONE SEEN
Casts: NONE SEEN /LPF
WBC, UA: >30 /HPF — AB (ref 0–5)

## 2022-11-22 ENCOUNTER — Ambulatory Visit: Payer: MEDICAID

## 2022-11-28 ENCOUNTER — Other Ambulatory Visit: Payer: Self-pay | Admitting: Psychiatry

## 2022-11-28 ENCOUNTER — Ambulatory Visit (INDEPENDENT_AMBULATORY_CARE_PROVIDER_SITE_OTHER): Payer: 59

## 2022-11-28 VITALS — BP 114/78 | HR 112 | Temp 97.8°F | Ht 72.0 in | Wt 155.6 lb

## 2022-11-28 DIAGNOSIS — F259 Schizoaffective disorder, unspecified: Secondary | ICD-10-CM | POA: Diagnosis not present

## 2022-11-28 MED ORDER — ARIPIPRAZOLE ER 300 MG IM PRSY
300.0000 mg | PREFILLED_SYRINGE | INTRAMUSCULAR | Status: AC
Start: 2022-11-28 — End: 2023-04-19
  Administered 2022-11-28 – 2023-04-19 (×6): 300 mg via INTRAMUSCULAR

## 2022-11-28 NOTE — Patient Instructions (Signed)
Pt was given the injection in the left deltoid.  Patient states he has been doing good on the dosage.  Patient has not had any thought of hurting himself or anyone else. Patient did fine during and after the injection.  Patient was told to come back 28 days for next injection.    Sterile water 1.21ml  lot # [id number] exp 01-2025 was injected into the Abilify Maintena 300mg  . Lot # [id number].  Shaken well and 1.5 ml was given to patient without any issues.    Abilify maintena 300mg   NDC# 40981-191-47  GTIN# [id number]          SN#  [id number] EXP  01-18-25  Lot # [id number]

## 2022-11-28 NOTE — Progress Notes (Signed)
Pt was given the injection in the left deltoid.  Patient states he has been doing good on the dosage.  Patient has not had any thought of hurting himself or anyone else. Patient did fine during and after the injection.  Patient was told to come back 28 days for next injection.    Sterile water 1.21ml  lot # [id number] exp 01-2025 was injected into the Abilify Maintena 300mg  . Lot # [id number].  Shaken well and 1.5 ml was given to patient without any issues.    Abilify maintena 300mg   NDC# 40981-191-47  GTIN# [id number]          SN#  [id number] EXP  01-18-25  Lot # [id number]

## 2022-12-14 ENCOUNTER — Other Ambulatory Visit
Admission: RE | Admit: 2022-12-14 | Discharge: 2022-12-14 | Disposition: A | Discharge status: Home / Self Care | Payer: MEDICAID | Attending: Nurse Practitioner | Admitting: Nurse Practitioner

## 2022-12-14 DIAGNOSIS — R7303 Prediabetes: Secondary | ICD-10-CM | POA: Insufficient documentation

## 2022-12-14 DIAGNOSIS — R748 Abnormal levels of other serum enzymes: Secondary | ICD-10-CM | POA: Insufficient documentation

## 2022-12-14 DIAGNOSIS — Z0001 Encounter for general adult medical examination with abnormal findings: Secondary | ICD-10-CM | POA: Diagnosis not present

## 2022-12-14 DIAGNOSIS — D509 Iron deficiency anemia, unspecified: Secondary | ICD-10-CM | POA: Diagnosis not present

## 2022-12-14 DIAGNOSIS — E559 Vitamin D deficiency, unspecified: Secondary | ICD-10-CM | POA: Insufficient documentation

## 2022-12-14 DIAGNOSIS — E782 Mixed hyperlipidemia: Secondary | ICD-10-CM | POA: Diagnosis not present

## 2022-12-14 LAB — COMPREHENSIVE METABOLIC PANEL
ALT: 19 U/L (ref 0–44)
AST: 29 U/L (ref 15–41)
Albumin: 4.2 g/dL (ref 3.5–5.0)
Alkaline Phosphatase: 58 U/L (ref 38–126)
Anion gap: 9 (ref 5–15)
BUN: 16 mg/dL (ref 6–20)
CO2: 26 mmol/L (ref 22–32)
Calcium: 9 mg/dL (ref 8.9–10.3)
Chloride: 100 mmol/L (ref 98–111)
Creatinine, Ser: 1.17 mg/dL (ref 0.61–1.24)
GFR, Estimated: >60 mL/min (ref >60)
Glucose, Bld: 90 mg/dL (ref 70–99)
Potassium: 4 mmol/L (ref 3.5–5.1)
Sodium: 135 mmol/L (ref 135–145)
Total Bilirubin: 0.7 mg/dL (ref 0.3–1.2)
Total Protein: 7.8 g/dL (ref 6.5–8.1)

## 2022-12-14 LAB — CBC WITH DIFFERENTIAL/PLATELET
Abs Immature Granulocytes: 0.02 10*3/uL (ref 0.00–0.07)
Basophils Absolute: 0 10*3/uL (ref 0.0–0.1)
Basophils Relative: 0 %
Eosinophils Absolute: 0.1 10*3/uL (ref 0.0–0.5)
Eosinophils Relative: 1 %
HCT: 39.9 % (ref 39.0–52.0)
Hemoglobin: 13.8 g/dL (ref 13.0–17.0)
Immature Granulocytes: 0 %
Lymphocytes Relative: 44 %
Lymphs Abs: 4.4 10*3/uL — ABNORMAL HIGH (ref 0.7–4.0)
MCH: 34.2 pg — ABNORMAL HIGH (ref 26.0–34.0)
MCHC: 34.6 g/dL (ref 30.0–36.0)
MCV: 98.8 fL (ref 80.0–100.0)
Monocytes Absolute: 0.5 10*3/uL (ref 0.1–1.0)
Monocytes Relative: 5 %
Neutro Abs: 4.9 10*3/uL (ref 1.7–7.7)
Neutrophils Relative %: 50 %
Platelets: 171 10*3/uL (ref 150–400)
RBC: 4.04 MIL/uL — ABNORMAL LOW (ref 4.22–5.81)
RDW: 12.1 % (ref 11.5–15.5)
WBC: 9.9 10*3/uL (ref 4.0–10.5)
nRBC: 0 % (ref 0.0–0.2)

## 2022-12-14 LAB — LIPID PANEL
Cholesterol: 101 mg/dL (ref 0–200)
HDL: 28 mg/dL — ABNORMAL LOW (ref >40)
LDL Cholesterol: 64 mg/dL (ref 0–99)
Total CHOL/HDL Ratio: 3.6 RATIO
Triglycerides: 44 mg/dL (ref <150)
VLDL: 9 mg/dL (ref 0–40)

## 2022-12-14 LAB — HEMOGLOBIN A1C
Hgb A1c MFr Bld: 5.6 % (ref 4.8–5.6)
Mean Plasma Glucose: 114.02 mg/dL

## 2022-12-14 LAB — FOLATE: Folate: 11.8 ng/mL (ref >5.9)

## 2022-12-14 LAB — VITAMIN D 25 HYDROXY (VIT D DEFICIENCY, FRACTURES): Vit D, 25-Hydroxy: 93.02 ng/mL (ref 30–100)

## 2022-12-14 LAB — VITAMIN B12: Vitamin B-12: 521 pg/mL (ref 180–914)

## 2022-12-26 ENCOUNTER — Ambulatory Visit (INDEPENDENT_AMBULATORY_CARE_PROVIDER_SITE_OTHER): Payer: 59

## 2022-12-26 VITALS — BP 118/76 | Temp 98.3°F | Ht 72.0 in | Wt 146.2 lb

## 2022-12-26 DIAGNOSIS — F259 Schizoaffective disorder, unspecified: Secondary | ICD-10-CM | POA: Diagnosis not present

## 2022-12-26 NOTE — Patient Instructions (Addendum)
Pt was given the injection in the left deltoid.  Patient states he has been doing good on the dosage.  Patient has not had any thought of hurting himself or anyone else. Patient did fine during and after the injection.  Patient was told to come back 28 days for next injection.    Sterile water 1.84ml  NDC# [id number]  lot # [id number] exp 05-2023 was injected into the Abilify Maintena 300mg  .  NDC# 13086-578-46 Lot # [id number]  exp 07-25.  Shaken well and 1.5 ml was given to patient without any issues.    Abilify maintena 300mg   NDC# 13244-010-27  GTIN# [id number]          SN#  [id number] EXP  05-19-23  Lot # [id number]

## 2022-12-26 NOTE — Progress Notes (Unsigned)
Pt was given the injection in the left deltoid.  Patient states he has been doing good on the dosage.  Patient has not had any thought of hurting himself or anyone else. Patient did fine during and after the injection.  Patient was told to come back 28 days for next injection.    Sterile water 1.84ml  NDC# [id number]  lot # [id number] exp 05-2023 was injected into the Abilify Maintena 300mg  .  NDC# 13086-578-46 Lot # [id number]  exp 07-25.  Shaken well and 1.5 ml was given to patient without any issues.    Abilify maintena 300mg   NDC# 13244-010-27  GTIN# [id number]          SN#  [id number] EXP  05-19-23  Lot # [id number]
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
## 2023-01-02 ENCOUNTER — Encounter: Payer: Self-pay | Admitting: Psychiatry

## 2023-01-02 ENCOUNTER — Ambulatory Visit (INDEPENDENT_AMBULATORY_CARE_PROVIDER_SITE_OTHER): Payer: 59 | Admitting: Psychiatry

## 2023-01-02 VITALS — BP 124/71 | HR 71 | Temp 97.4°F | Ht 72.0 in | Wt 148.0 lb

## 2023-01-02 DIAGNOSIS — F259 Schizoaffective disorder, unspecified: Secondary | ICD-10-CM

## 2023-01-02 DIAGNOSIS — G4700 Insomnia, unspecified: Secondary | ICD-10-CM | POA: Diagnosis not present

## 2023-01-02 MED ORDER — ABILIFY MAINTENA 300 MG IM SRER: 300.0000 mg | INTRAMUSCULAR | 5 refills | Status: DC

## 2023-01-02 NOTE — Patient Instructions (Signed)
Continue Abilify 300 mg IM every 28 day Continue Trazodone 25-50 mg at night  Next appointment: 1/7 at 11 30

## 2023-01-23 ENCOUNTER — Ambulatory Visit (INDEPENDENT_AMBULATORY_CARE_PROVIDER_SITE_OTHER): Payer: 59

## 2023-01-23 VITALS — BP 113/79 | HR 69 | Temp 97.1°F | Ht 72.0 in | Wt 144.0 lb

## 2023-01-23 DIAGNOSIS — F259 Schizoaffective disorder, unspecified: Secondary | ICD-10-CM | POA: Diagnosis not present

## 2023-01-23 NOTE — Patient Instructions (Signed)
Pt was given the injection in the right  deltoid.  Patient states he has been doing good on the dosage.  Patient has not had any thought of hurting himself or anyone else. Patient did fine during and after the injection.  Patient was told to come back 28 days for next injection.    Sterile water 1.67ml  NDC# [id number]  lot # [id number] exp 05-2023 was injected into the Abilify Maintena 300mg  .  NDC# 16109-604-54 Lot # [id number]  exp 07-25.  Shaken well and 1.5 ml was given to patient without any issues.    Abilify maintena 300mg   NDC# 78295-621-30  GTIN# [id number]          SN#  [id number]  EXP  06-18-23  Lot # [id number]

## 2023-01-23 NOTE — Progress Notes (Signed)
Pt was given the injection in the right  deltoid.  Patient states he has been doing good on the dosage.  Patient has not had any thought of hurting himself or anyone else. Patient did fine during and after the injection.  Patient was told to come back 28 days for next injection.    Sterile water 1.67ml  NDC# [id number]  lot # [id number] exp 05-2023 was injected into the Abilify Maintena 300mg  .  NDC# 16109-604-54 Lot # [id number]  exp 07-25.  Shaken well and 1.5 ml was given to patient without any issues.    Abilify maintena 300mg   NDC# 78295-621-30  GTIN# [id number]          SN#  [id number]  EXP  06-18-23  Lot # [id number]

## 2023-01-27 NOTE — Progress Notes (Signed)
Lipid panel ok Slightly anemic but this is a chronic issue, these labs can be influenced by some of his psych medications. Vitamin D level is great, have him completely stop any vitamin D supplements, he does not need them and wont need them for a long while.  B12 level and folate are ok A1c is normal now. Will check in a year CMP is normal, kidney and liver function are normal.

## 2023-01-29 ENCOUNTER — Telehealth: Payer: Self-pay

## 2023-01-29 NOTE — Telephone Encounter (Signed)
-----   Message from [facility] sent at 01/27/2023  9:45 AM EST ----- Lipid panel ok Slightly anemic but this is a chronic issue, these labs can be influenced by some of his psych medications. Vitamin D level is great, have him completely stop any vitamin D supplements, he does not need them and wont need them for a long while.  B12 level and folate are ok A1c is normal now. Will check in a year CMP is normal, kidney and liver function are normal.

## 2023-01-29 NOTE — Telephone Encounter (Signed)
Left message for patient to give office a call back

## 2023-01-29 NOTE — Telephone Encounter (Signed)
Pt notified for labs  

## 2023-02-20 ENCOUNTER — Ambulatory Visit: Payer: 59

## 2023-02-22 ENCOUNTER — Ambulatory Visit (INDEPENDENT_AMBULATORY_CARE_PROVIDER_SITE_OTHER): Payer: PRIVATE HEALTH INSURANCE

## 2023-02-22 VITALS — BP 110/78 | HR 81 | Temp 97.9°F | Ht 72.0 in | Wt 149.4 lb

## 2023-02-22 DIAGNOSIS — F259 Schizoaffective disorder, unspecified: Secondary | ICD-10-CM | POA: Diagnosis not present

## 2023-02-22 NOTE — Patient Instructions (Signed)
Pt was given the injection in the left deltoid.  Patient states he has been doing good on the dosage.  Patient has not had any thought of hurting himself or anyone else. Patient did fine during and after the injection.  Patient was told to come back 28 days for next injection.    Sterile water 1.40ml  NDC# [id number]  lot # [id number] exp 04-2025 was injected into the Abilify Maintena 300mg  .  NDC# 75643-329-51 Lot # [id number]  exp 03-27.  Shaken well and 1.5 ml was given to patient without any issues.    Abilify maintena 300mg   NDC# 30160-109-32  GTIN# [id number]          SN#  [id number] EXP  03-20-25  Lot # [id number]

## 2023-02-22 NOTE — Progress Notes (Signed)
Pt was given the injection in the left deltoid.  Patient states he has been doing good on the dosage.  Patient has not had any thought of hurting himself or anyone else. Patient did fine during and after the injection.  Patient was told to come back 28 days for next injection.    Sterile water 1.40ml  NDC# [id number]  lot # [id number] exp 04-2025 was injected into the Abilify Maintena 300mg  .  NDC# 75643-329-51 Lot # [id number]  exp 03-27.  Shaken well and 1.5 ml was given to patient without any issues.    Abilify maintena 300mg   NDC# 30160-109-32  GTIN# [id number]          SN#  [id number] EXP  03-20-25  Lot # [id number]

## 2023-03-22 ENCOUNTER — Ambulatory Visit (INDEPENDENT_AMBULATORY_CARE_PROVIDER_SITE_OTHER): Payer: PRIVATE HEALTH INSURANCE

## 2023-03-22 VITALS — BP 118/86 | Temp 98.3°F | Ht 72.0 in | Wt 153.6 lb

## 2023-03-22 DIAGNOSIS — F259 Schizoaffective disorder, unspecified: Secondary | ICD-10-CM

## 2023-03-22 NOTE — Progress Notes (Signed)
 Pt was given the injection in the right deltoid.  Patient states he has been doing good on the dosage.  Patient has not had any thought of hurting himself or anyone else. Patient did fine during and after the injection.  Patient was told to come back 28 days for next injection.    Sterile water 1.62ml  NDC# [id number]  lot # [id number] exp 04-2025 was injected into the Abilify  Maintena 300mg  .  NDC# 40851-981-29 Lot # [id number]  exp 03-27.  Shaken well and 1.5 ml was given to patient without any issues.    Abilify  maintena 300mg   NDC# 40851-981-28  GTIN# [id number]          SN#  [id number] EXP  03-20-25  Lot # [id number]

## 2023-03-22 NOTE — Patient Instructions (Signed)
 Pt was given the injection in the right deltoid.  Patient states he has been doing good on the dosage.  Patient has not had any thought of hurting himself or anyone else. Patient did fine during and after the injection.  Patient was told to come back 28 days for next injection.    Sterile water 1.62ml  NDC# [id number]  lot # [id number] exp 04-2025 was injected into the Abilify  Maintena 300mg  .  NDC# 40851-981-29 Lot # [id number]  exp 03-27.  Shaken well and 1.5 ml was given to patient without any issues.    Abilify  maintena 300mg   NDC# 40851-981-28  GTIN# [id number]          SN#  [id number] EXP  03-20-25  Lot # [id number]
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
## 2023-03-27 ENCOUNTER — Encounter: Payer: Self-pay | Admitting: Psychiatry

## 2023-03-27 ENCOUNTER — Ambulatory Visit (INDEPENDENT_AMBULATORY_CARE_PROVIDER_SITE_OTHER): Payer: PRIVATE HEALTH INSURANCE | Admitting: Psychiatry

## 2023-03-27 VITALS — BP 118/78 | Temp 97.6°F | Ht 72.0 in | Wt 151.6 lb

## 2023-03-27 DIAGNOSIS — F17210 Nicotine dependence, cigarettes, uncomplicated: Secondary | ICD-10-CM

## 2023-03-27 DIAGNOSIS — F259 Schizoaffective disorder, unspecified: Secondary | ICD-10-CM | POA: Diagnosis not present

## 2023-03-27 DIAGNOSIS — G4700 Insomnia, unspecified: Secondary | ICD-10-CM | POA: Diagnosis not present

## 2023-03-27 MED ORDER — VARENICLINE TARTRATE (STARTER) 0.5 MG X 11 & 1 MG X 42 PO TBPK
ORAL_TABLET | ORAL | 0 refills | Status: AC
Start: 2023-03-27 — End: 2023-05-08

## 2023-03-27 NOTE — Patient Instructions (Signed)
 Continue Abilify 300 mg IM every 28 days Continue Trazodone 25-50 mg at night  Start varenicline as instructed Next appointment: 3/20 at 11 30

## 2023-04-19 ENCOUNTER — Telehealth: Payer: Self-pay

## 2023-04-19 ENCOUNTER — Ambulatory Visit (INDEPENDENT_AMBULATORY_CARE_PROVIDER_SITE_OTHER): Payer: PRIVATE HEALTH INSURANCE

## 2023-04-19 VITALS — BP 118/78 | Temp 98.2°F | Ht 72.0 in | Wt 145.8 lb

## 2023-04-19 DIAGNOSIS — F259 Schizoaffective disorder, unspecified: Secondary | ICD-10-CM | POA: Diagnosis not present

## 2023-04-19 NOTE — Telephone Encounter (Signed)
pt camed into office today to get his injection. pt was talking to himself but otherwise looked good and he states no thought of harming himself or others.

## 2023-04-19 NOTE — Progress Notes (Unsigned)
Pt was given the injection in the left deltoid.  Patient states he has been doing good on the dosage.  Patient has not had any thought of hurting himself or anyone else. Patient did fine during and after the injection.  Patient was told to come back 28 days for next injection.    Noticed that patient was talking to himself.  Sterile water 1.50ml  NDC# [id number]  lot # [id number] exp 05-2025 was injected into the Abilify Maintena 300mg  .  NDC# 16109-604-54 Lot # [id number]  exp 07-27.  Shaken well and 1.5 ml was given to patient without any issues.    Box Abilify maintena 300mg   NDC# 78295-621-30  GTIN# [id number]          SN#  [id number] EXP  04-2025  Lot # [id number]

## 2023-04-19 NOTE — Patient Instructions (Signed)
Pt was given the injection in the left deltoid.  Patient states he has been doing good on the dosage.  Patient has not had any thought of hurting himself or anyone else. Patient did fine during and after the injection.  Patient was told to come back 28 days for next injection.    Noticed that patient was talking to himself.  Sterile water 1.50ml  NDC# [id number]  lot # [id number] exp 05-2025 was injected into the Abilify Maintena 300mg  .  NDC# 16109-604-54 Lot # [id number]  exp 07-27.  Shaken well and 1.5 ml was given to patient without any issues.    Box Abilify maintena 300mg   NDC# 78295-621-30  GTIN# [id number]          SN#  [id number] EXP  04-2025  Lot # [id number]

## 2023-05-17 ENCOUNTER — Ambulatory Visit (INDEPENDENT_AMBULATORY_CARE_PROVIDER_SITE_OTHER): Payer: PRIVATE HEALTH INSURANCE

## 2023-05-17 ENCOUNTER — Other Ambulatory Visit: Payer: Self-pay | Admitting: Psychiatry

## 2023-05-17 VITALS — BP 118/82 | HR 68 | Temp 98.0°F | Ht 72.0 in | Wt 153.6 lb

## 2023-05-17 DIAGNOSIS — F259 Schizoaffective disorder, unspecified: Secondary | ICD-10-CM | POA: Diagnosis not present

## 2023-05-17 DIAGNOSIS — F258 Other schizoaffective disorders: Secondary | ICD-10-CM

## 2023-05-17 DIAGNOSIS — F17210 Nicotine dependence, cigarettes, uncomplicated: Secondary | ICD-10-CM

## 2023-05-17 DIAGNOSIS — G4700 Insomnia, unspecified: Secondary | ICD-10-CM

## 2023-05-17 MED ORDER — ARIPIPRAZOLE ER 300 MG IM PRSY
300.0000 mg | PREFILLED_SYRINGE | INTRAMUSCULAR | Status: AC
Start: 2023-05-17 — End: 2023-11-01
  Administered 2023-05-17 – 2023-09-11 (×5): 300 mg via INTRAMUSCULAR

## 2023-05-17 MED ORDER — ABILIFY MAINTENA 300 MG IM SRER: 300.0000 mg | INTRAMUSCULAR | 5 refills | Status: DC

## 2023-05-17 NOTE — Progress Notes (Unsigned)
 Pt was given the injection in the right deltoid.  Patient states he has been doing good on the dosage.  Patient has not had any thought of hurting himself or anyone else. Patient did fine during and after the injection.  Patient was told to come back 28 days for next injection.    Sterile water 1.75ml  NDC# [id number]  lot # [id number] exp 05-2025 was injected into the Abilify Maintena 300mg  .  NDC# 16109-604-54 Lot # [id number]  exp 07-27.  Shaken well and 1.5 ml was given to patient without any issues.    Abilify maintena 300mg   NDC# 78295-621-30 GTIN# [id number]          SN#  [id number] EXP  04-20-25  Lot # [id number]

## 2023-05-17 NOTE — Patient Instructions (Signed)
 Pt was given the injection in the right deltoid.  Patient states he has been doing good on the dosage.  Patient has not had any thought of hurting himself or anyone else. Patient did fine during and after the injection.  Patient was told to come back 28 days for next injection.    Sterile water 1.75ml  NDC# [id number]  lot # [id number] exp 05-2025 was injected into the Abilify Maintena 300mg  .  NDC# 16109-604-54 Lot # [id number]  exp 07-27.  Shaken well and 1.5 ml was given to patient without any issues.    Abilify maintena 300mg   NDC# 78295-621-30 GTIN# [id number]          SN#  [id number] EXP  04-20-25  Lot # [id number]
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
## 2023-06-07 ENCOUNTER — Encounter: Payer: Self-pay | Admitting: Psychiatry

## 2023-06-07 ENCOUNTER — Ambulatory Visit (INDEPENDENT_AMBULATORY_CARE_PROVIDER_SITE_OTHER): Payer: PRIVATE HEALTH INSURANCE | Admitting: Psychiatry

## 2023-06-07 VITALS — BP 118/84 | HR 82 | Temp 98.2°F | Ht 72.0 in | Wt 151.8 lb

## 2023-06-07 DIAGNOSIS — F259 Schizoaffective disorder, unspecified: Secondary | ICD-10-CM

## 2023-06-07 DIAGNOSIS — G4700 Insomnia, unspecified: Secondary | ICD-10-CM | POA: Diagnosis not present

## 2023-06-07 DIAGNOSIS — F17210 Nicotine dependence, cigarettes, uncomplicated: Secondary | ICD-10-CM

## 2023-06-07 MED ORDER — VARENICLINE TARTRATE 1 MG PO TABS: 1.0000 mg | ORAL_TABLET | Freq: Two times a day (BID) | ORAL | 0 refills | Status: AC

## 2023-06-07 NOTE — Patient Instructions (Addendum)
 Continue Abilify 300 mg IM every 28 days Continue Trazodone 25-50 mg at night  Continue varenicline 1 mg twice a day Next appointment: 7/17 at 11 30

## 2023-06-14 ENCOUNTER — Ambulatory Visit (INDEPENDENT_AMBULATORY_CARE_PROVIDER_SITE_OTHER): Payer: PRIVATE HEALTH INSURANCE

## 2023-06-14 VITALS — BP 110/80 | HR 85 | Temp 98.8°F | Ht 72.0 in | Wt 149.0 lb

## 2023-06-14 DIAGNOSIS — F259 Schizoaffective disorder, unspecified: Secondary | ICD-10-CM

## 2023-06-14 NOTE — Patient Instructions (Signed)
 Pt was given the injection in the left deltoid.  Patient states he has been doing good on the dosage.  Patient has not had any thought of hurting himself or anyone else. Patient did fine during and after the injection.  Patient was told to come back 28 days for next injection.    Sterile water 1.88ml  NDC# [id number]  lot # [id number] exp 04-2025 was injected into the Abilify Maintena 300mg  .  NDC# 09811-914-78 Lot # [id number]  exp 07-27.  Shaken well and 1.5 ml was given to patient without any issues.    Abilify maintena 300mg   NDC# 86578-469-62 GTIN# [id number]          SN#  [id number] EXP  04-20-25  Lot # [id number]

## 2023-06-14 NOTE — Progress Notes (Unsigned)
 Pt was given the injection in the left deltoid.  Patient states he has been doing good on the dosage.  Patient has not had any thought of hurting himself or anyone else. Patient did fine during and after the injection.  Patient was told to come back 28 days for next injection.    Sterile water 1.88ml  NDC# [id number]  lot # [id number] exp 04-2025 was injected into the Abilify Maintena 300mg  .  NDC# 09811-914-78 Lot # [id number]  exp 07-27.  Shaken well and 1.5 ml was given to patient without any issues.    Abilify maintena 300mg   NDC# 86578-469-62 GTIN# [id number]          SN#  [id number] EXP  04-20-25  Lot # [id number]

## 2023-07-12 ENCOUNTER — Ambulatory Visit (INDEPENDENT_AMBULATORY_CARE_PROVIDER_SITE_OTHER): Payer: PRIVATE HEALTH INSURANCE

## 2023-07-12 VITALS — BP 118/82 | HR 80 | Temp 97.8°F | Ht 72.0 in | Wt 148.4 lb

## 2023-07-12 DIAGNOSIS — F259 Schizoaffective disorder, unspecified: Secondary | ICD-10-CM

## 2023-07-12 NOTE — Patient Instructions (Signed)
 Pt was given the injection in the right deltoid.  Patient states he has been doing good on the dosage.  Patient has not had any thought of hurting himself or anyone else. Patient did fine during and after the injection.  Patient was told to come back 28 days for next injection.    Sterile water 1.31ml  NDC# [id number]  lot # [id number] exp [phone] was injected into the Abilify  Maintena 300mg  .  NDC# 45409-811-91 Lot # [id number]  exp 11-27.  Shaken well and 1.5 ml was given to patient without any issues.    Abilify  maintena 300mg   NDC# 30865-784-69 GTIN# [id number]         SN#  [id number] EXP  05-18-25  Lot # [id number]

## 2023-07-12 NOTE — Progress Notes (Signed)
 Pt was given the injection in the right deltoid.  Patient states he has been doing good on the dosage.  Patient has not had any thought of hurting himself or anyone else. Patient did fine during and after the injection.  Patient was told to come back 28 days for next injection.    Sterile water 1.31ml  NDC# [id number]  lot # [id number] exp [phone] was injected into the Abilify  Maintena 300mg  .  NDC# 45409-811-91 Lot # [id number]  exp 11-27.  Shaken well and 1.5 ml was given to patient without any issues.    Abilify  maintena 300mg   NDC# 30865-784-69 GTIN# [id number]         SN#  [id number] EXP  05-18-25  Lot # [id number]

## 2023-08-08 ENCOUNTER — Telehealth: Payer: Self-pay | Admitting: Psychiatry

## 2023-08-08 NOTE — Telephone Encounter (Signed)
 Patient called to cancel the injection scheduled for 08-09-23 stating pharmacy states he needs a refill. He rescheduled to 08-14-23. Please review

## 2023-08-08 NOTE — Telephone Encounter (Signed)
 [facility]  spoke to [place] she stated that the patient had been getting his Abilify  injection from a [facility] which [facility] due to [facility] [facility] not having the injection in stock [place] stated that if I could get the first and last name of the pharmacist she could transfer the remaining refills to the [facility] for the patient. Got the information she needed called back spoke to [place] she had left for lunch gave information to Abe Abed who is also a pharmacist to pass along to [place] called patient to make aware and advise patient to call the pharmacy later to verify when he could pick up his injection patient voiced understanding

## 2023-08-08 NOTE — Telephone Encounter (Signed)
 Could you contact the pharmacy to verify- they should have medication to last until August.

## 2023-08-09 ENCOUNTER — Ambulatory Visit

## 2023-08-14 ENCOUNTER — Ambulatory Visit (INDEPENDENT_AMBULATORY_CARE_PROVIDER_SITE_OTHER): Payer: PRIVATE HEALTH INSURANCE

## 2023-08-14 VITALS — BP 118/76 | Temp 98.0°F | Ht 72.0 in | Wt 147.8 lb

## 2023-08-14 DIAGNOSIS — F259 Schizoaffective disorder, unspecified: Secondary | ICD-10-CM

## 2023-08-14 NOTE — Patient Instructions (Signed)
 Pt was given the injection in the left deltoid.  Patient states he has been doing good on the dosage.  Patient has not had any thought of hurting himself or anyone else. Patient did fine during and after the injection.  Patient was told to come back 28 days for next injection.      Abilify  maintena 300mg   NDC# 52841-324-40 GTIN# [id number]     SN#  [id number] EXP  11-18-25  Lot # [id number]

## 2023-08-14 NOTE — Progress Notes (Signed)
 Pt was given the injection in the left deltoid.  Patient states he has been doing good on the dosage.  Patient has not had any thought of hurting himself or anyone else. Patient did fine during and after the injection.  Patient was told to come back 28 days for next injection.      Abilify  maintena 300mg   NDC# 40981-191-47 GTIN# [id number]     SN#  [id number] EXP  11-18-25  Lot # [id number]

## 2023-09-11 ENCOUNTER — Other Ambulatory Visit: Payer: Self-pay

## 2023-09-11 ENCOUNTER — Ambulatory Visit (INDEPENDENT_AMBULATORY_CARE_PROVIDER_SITE_OTHER): Payer: PRIVATE HEALTH INSURANCE

## 2023-09-11 VITALS — BP 111/69 | HR 62 | Temp 97.3°F | Ht 72.0 in | Wt 147.0 lb

## 2023-09-11 DIAGNOSIS — F259 Schizoaffective disorder, unspecified: Secondary | ICD-10-CM

## 2023-09-11 NOTE — Progress Notes (Unsigned)
 Patient present flat affect mood was pleasant and denied visual or auditory hallucinations. No suicidal or homicidal ideations, no plan, intent, or means to want to harm self or others. Patients Abilify  Maintena 300 mg IM injection prepared as ordered and administered to patient in her right deltoid. Patient tolerated without discomfort or pain. Patient will return in 3 weeks and will call if there is any changes.  NDC 40851-954-19 LOT [id number] GTIN [id number] SN [id number] EXP 02-16-2026

## 2023-09-11 NOTE — Patient Instructions (Signed)
 Patient present flat affect mood was pleasant and denied visual or auditory hallucinations. No suicidal or homicidal ideations, no plan, intent, or means to want to harm self or others. Patients Abilify  Maintena 300 mg IM injection prepared as ordered and administered to patient in her right deltoid. Patient tolerated without discomfort or pain. Patient will return in 3 weeks and will call if there is any changes.  NDC 40851-954-19 LOT [id number] GTIN [id number] SN [id number] EXP 02-16-2026
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
## 2023-10-04 ENCOUNTER — Ambulatory Visit (INDEPENDENT_AMBULATORY_CARE_PROVIDER_SITE_OTHER): Payer: PRIVATE HEALTH INSURANCE | Admitting: Psychiatry

## 2023-10-04 ENCOUNTER — Encounter: Payer: Self-pay | Admitting: Psychiatry

## 2023-10-04 VITALS — BP 118/82 | HR 78 | Temp 98.1°F | Ht 72.0 in | Wt 149.8 lb

## 2023-10-04 DIAGNOSIS — F17210 Nicotine dependence, cigarettes, uncomplicated: Secondary | ICD-10-CM | POA: Diagnosis not present

## 2023-10-04 DIAGNOSIS — G4700 Insomnia, unspecified: Secondary | ICD-10-CM

## 2023-10-04 DIAGNOSIS — F259 Schizoaffective disorder, unspecified: Secondary | ICD-10-CM

## 2023-10-04 MED ORDER — ABILIFY MAINTENA 300 MG IM PRSY: 300.0000 mg | PREFILLED_SYRINGE | INTRAMUSCULAR | 5 refills | Status: DC

## 2023-10-04 MED ORDER — TRAZODONE HCL 50 MG PO TABS: 25.0000 mg | ORAL_TABLET | Freq: Every day | ORAL | 3 refills | Status: AC

## 2023-10-04 NOTE — Patient Instructions (Signed)
 Continue Abilify  300 mg IM every 28 days  Continue Trazodone  25-50 mg at night  Discontinue varenicline  Next appointment: 11/11 at 10 AM

## 2023-10-09 ENCOUNTER — Ambulatory Visit (INDEPENDENT_AMBULATORY_CARE_PROVIDER_SITE_OTHER): Payer: PRIVATE HEALTH INSURANCE

## 2023-10-09 ENCOUNTER — Other Ambulatory Visit: Payer: Self-pay | Admitting: Psychiatry

## 2023-10-09 VITALS — BP 118/68 | HR 83 | Temp 98.1°F | Ht 72.0 in | Wt 151.2 lb

## 2023-10-09 DIAGNOSIS — F259 Schizoaffective disorder, unspecified: Secondary | ICD-10-CM | POA: Diagnosis not present

## 2023-10-09 MED ORDER — ARIPIPRAZOLE ER 300 MG IM PRSY
300.0000 mg | PREFILLED_SYRINGE | INTRAMUSCULAR | Status: AC
  Administered 2023-10-09 – 2024-04-17 (×8): 300 mg via INTRAMUSCULAR

## 2023-10-09 NOTE — Patient Instructions (Signed)
 Pt was given the injection in the left deltoid.  Patient states he has been doing good on the dosage.  Patient has not had any thought of hurting himself or anyone else. Patient did fine during and after the injection.  Patient was told to come back 28 days for next injection.      Abilify  maintena 300mg   NDC# 40851-954-19 GTIN# [id number]     SN#  [id number] EXP  01-18-26  Lot # [id number]

## 2023-10-09 NOTE — Progress Notes (Signed)
 Pt was given the injection in the left deltoid.  Patient states he has been doing good on the dosage.  Patient has not had any thought of hurting himself or anyone else. Patient did fine during and after the injection.  Patient was told to come back 28 days for next injection.      Abilify  maintena 300mg   NDC# 40851-954-19 GTIN# [id number]     SN#  [id number] EXP  01-18-26  Lot # [id number]

## 2023-11-06 ENCOUNTER — Ambulatory Visit (INDEPENDENT_AMBULATORY_CARE_PROVIDER_SITE_OTHER)

## 2023-11-06 VITALS — BP 122/76 | Temp 97.9°F | Ht 72.0 in | Wt 150.4 lb

## 2023-11-06 DIAGNOSIS — F259 Schizoaffective disorder, unspecified: Secondary | ICD-10-CM | POA: Diagnosis not present

## 2023-11-06 NOTE — Progress Notes (Signed)
 Pt was given the injection in the right deltoid.  Patient states he has been doing good on the dosage.  Patient appeared clean and well dressed. Patient has not had any thought of hurting himself or anyone else. Patient did fine during and after the injection.  Patient was told to come back 28 days for next injection.      Abilify  maintena 300mg   NDC# 40851-954-19 GTIN# [id number]   SN#  [id number] EXP  01-18-26  Lot # [id number]

## 2023-11-06 NOTE — Patient Instructions (Signed)
 Pt was given the injection in the right deltoid.  Patient states he has been doing good on the dosage.  Patient appeared clean and well dressed. Patient has not had any thought of hurting himself or anyone else. Patient did fine during and after the injection.  Patient was told to come back 28 days for next injection.      Abilify  maintena 300mg   NDC# 40851-954-19 GTIN# [id number]   SN#  [id number] EXP  01-18-26  Lot # [id number]

## 2023-11-13 ENCOUNTER — Ambulatory Visit: Payer: MEDICAID | Admitting: Nurse Practitioner

## 2023-11-13 ENCOUNTER — Encounter: Payer: Self-pay | Admitting: Nurse Practitioner

## 2023-11-13 VITALS — BP 130/80 | Temp 95.3°F | Resp 16 | Ht 71.0 in | Wt 149.0 lb

## 2023-11-13 DIAGNOSIS — F259 Schizoaffective disorder, unspecified: Secondary | ICD-10-CM | POA: Diagnosis not present

## 2023-11-13 DIAGNOSIS — D509 Iron deficiency anemia, unspecified: Secondary | ICD-10-CM

## 2023-11-13 DIAGNOSIS — Z0001 Encounter for general adult medical examination with abnormal findings: Secondary | ICD-10-CM | POA: Diagnosis not present

## 2023-11-13 DIAGNOSIS — R748 Abnormal levels of other serum enzymes: Secondary | ICD-10-CM | POA: Diagnosis not present

## 2023-11-13 DIAGNOSIS — R7303 Prediabetes: Secondary | ICD-10-CM

## 2023-11-13 DIAGNOSIS — E782 Mixed hyperlipidemia: Secondary | ICD-10-CM

## 2023-11-13 DIAGNOSIS — J3089 Other allergic rhinitis: Secondary | ICD-10-CM

## 2023-11-13 DIAGNOSIS — E559 Vitamin D deficiency, unspecified: Secondary | ICD-10-CM

## 2023-11-13 MED ORDER — LEVOCETIRIZINE DIHYDROCHLORIDE 5 MG PO TABS: 5.0000 mg | ORAL_TABLET | Freq: Every evening | ORAL | 3 refills | Status: AC

## 2023-11-13 NOTE — Progress Notes (Signed)
 [facility] [address] [place], [place] [address]  Internal MEDICINE  Office Visit Note  Patient Name: Manuel Horn  [id number]  [id number]  Date of Service: 11/13/2023  Chief Complaint  Patient presents with   Annual Exam    HPI Kingstyn presents for an annual well visit and physical exam.  Well-appearing 43 y.o. male with schizoaffective disorder, gallstones, and tobacco use.  Labs: due for routine labs -- history of elevated liver enzymes,  New or worsening pain: none  Wants to get the flu vaccine.  Allergic rhinitis symptoms chronically year round, not currently on allergy medication.  Lives with family, disabled, not working. Smoker, not interested in smoking cessation. He currently smokes about 10 cigarettes per day and has been smoking for at least 20 years.  No changes in his psychiatric medications and he is currently seeing Dr. Vickey.   Current Medication: Outpatient Encounter Medications as of 11/13/2023  Medication Sig   levocetirizine (XYZAL ) 5 MG tablet Take 1 tablet (5 mg total) by mouth every evening.   ABILIFY  MAINTENA 300 MG PRSY prefilled syringe Inject 300 mg into the muscle every 28 (twenty-eight) days.   traZODone  (DESYREL ) 50 MG tablet Take 0.5-1 tablets (25-50 mg total) by mouth at bedtime.   Vitamin D , Ergocalciferol , (DRISDOL ) 1.25 MG (50000 UNIT) CAPS capsule Take 1 capsule (50,000 Units total) by mouth every 7 (seven) days.   Facility-Administered Encounter Medications as of 11/13/2023  Medication   ARIPiprazole  ER (ABILIFY  MAINTENA) 300 MG prefilled syringe 300 mg    Surgical History: Past Surgical History:  Procedure Laterality Date   CHOLECYSTECTOMY     ENDOSCOPIC RETROGRADE CHOLANGIOPANCREATOGRAPHY (ERCP) WITH PROPOFOL  N/A 01/20/2020   Procedure: ENDOSCOPIC RETROGRADE CHOLANGIOPANCREATOGRAPHY (ERCP) WITH PROPOFOL ;  Surgeon: Jinny Carmine, MD;  Location: [facility] ENDOSCOPY;  Service: Endoscopy;  Laterality: N/A;   ERCP N/A 02/17/2020    Procedure: ENDOSCOPIC RETROGRADE CHOLANGIOPANCREATOGRAPHY (ERCP);  Surgeon: Jinny Carmine, MD;  Location: [facility] ENDOSCOPY;  Service: Endoscopy;  Laterality: N/A;   IR PERCUTANEOUS TRANSHEPATIC CHOLANGIOGRAM  01/21/2020   LEG SURGERY Left     Medical History: Past Medical History:  Diagnosis Date   Schizoaffective disorder (HCC)     Family History: Family History  Problem Relation Age of Onset   Diabetes Mother    Hypertension Mother    Depression Maternal Aunt    Schizophrenia Maternal Uncle     Social History   Socioeconomic History   Marital status: Single    Spouse name: Not on file   Number of children: 0   Years of education: Not on file   Highest education level: Not on file  Occupational History   Occupation: disability  Tobacco Use   Smoking status: Every Day    Current packs/day: 2.00    Types: Cigarettes   Smokeless tobacco: Never   Tobacco comments:    1.5 packs a day  Vaping Use   Vaping status: Never Used  Substance and Sexual Activity   Alcohol use: No   Drug use: No   Sexual activity: Not Currently  Other Topics Concern   Not on file  Social History Narrative   Not on file   Social Drivers of Health   Financial Resource Strain: Not on file  Food Insecurity: Not on file  Transportation Needs: Not on file  Physical Activity: Not on file  Stress: Not on file  Social Connections: Not on file  Intimate Partner Violence: Not on file      Review of Systems  Constitutional:  Negative for activity change, appetite change, chills, fatigue, fever and unexpected weight change.  HENT: Negative.  Negative for congestion, ear pain, rhinorrhea, sore throat and trouble swallowing.   Eyes: Negative.   Respiratory: Negative.  Negative for cough, chest tightness, shortness of breath and wheezing.   Cardiovascular: Negative.  Negative for chest pain.  Gastrointestinal: Negative.  Negative for abdominal pain, blood in stool, constipation, diarrhea, nausea and  vomiting.  Endocrine: Negative.   Genitourinary: Negative.  Negative for difficulty urinating, dysuria, frequency, hematuria and urgency.  Musculoskeletal: Negative.  Negative for arthralgias, back pain, joint swelling, myalgias and neck pain.  Skin: Negative.  Negative for rash and wound.  Allergic/Immunologic: Negative.  Negative for immunocompromised state.  Neurological: Negative.  Negative for dizziness, seizures, numbness and headaches.  Hematological: Negative.   Psychiatric/Behavioral: Negative.  Negative for behavioral problems, self-injury and suicidal ideas. The patient is not nervous/anxious.     Vital Signs: BP 130/80   Temp (!) 95.3 F (35.2 C)   Resp 16   Ht 5' 11 (1.803 m)   Wt 149 lb (67.6 kg)   BMI 20.78 kg/m    Physical Exam Vitals reviewed.  Constitutional:      General: He is awake. He is not in acute distress.    Appearance: Normal appearance. He is well-developed, well-groomed and normal weight. He is not ill-appearing or diaphoretic.  HENT:     Head: Normocephalic and atraumatic.     Right Ear: Tympanic membrane, ear canal and external ear normal.     Left Ear: Tympanic membrane, ear canal and external ear normal.     Nose: Nose normal. No congestion or rhinorrhea.     Mouth/Throat:     Lips: Pink.     Mouth: Mucous membranes are moist.     Pharynx: Oropharynx is clear. Uvula midline. No oropharyngeal exudate or posterior oropharyngeal erythema.  Eyes:     General: Lids are normal. Vision grossly intact. Gaze aligned appropriately.        Right eye: No discharge.        Left eye: No discharge.     Extraocular Movements: Extraocular movements intact.     Conjunctiva/sclera: Conjunctivae normal.     Pupils: Pupils are equal, round, and reactive to light.     Funduscopic exam:    Right eye: Red reflex present.        Left eye: Red reflex present. Neck:     Thyroid: No thyromegaly.     Vascular: No carotid bruit or JVD.     Trachea: Trachea and  phonation normal. No tracheal deviation.  Cardiovascular:     Rate and Rhythm: Normal rate and regular rhythm.     Pulses:          Carotid pulses are 3+ on the right side and 3+ on the left side.      Radial pulses are 2+ on the right side and 2+ on the left side.       Dorsalis pedis pulses are 2+ on the right side and 2+ on the left side.       Posterior tibial pulses are 2+ on the right side and 2+ on the left side.     Heart sounds: Normal heart sounds, S1 normal and S2 normal. No murmur heard.    No friction rub. No gallop.  Pulmonary:     Effort: Pulmonary effort is normal. No accessory muscle usage or respiratory distress.     Breath sounds:  Normal breath sounds and air entry. No stridor. No wheezing or rales.  Chest:     Chest wall: No tenderness.  Abdominal:     General: Bowel sounds are normal. There is no distension.     Palpations: Abdomen is soft. There is no shifting dullness, fluid wave, mass or pulsatile mass.     Tenderness: There is no abdominal tenderness. There is no guarding or rebound.  Musculoskeletal:        General: No tenderness or deformity. Normal range of motion.     Cervical back: Normal range of motion and neck supple.     Right lower leg: No edema.     Left lower leg: No edema.  Lymphadenopathy:     Cervical: No cervical adenopathy.  Skin:    General: Skin is warm and dry.     Capillary Refill: Capillary refill takes less than 2 seconds.     Coloration: Skin is not pale.     Findings: No erythema or rash.  Neurological:     Mental Status: He is alert and oriented to person, place, and time.     Cranial Nerves: No cranial nerve deficit.     Motor: No abnormal muscle tone.     Coordination: Coordination normal.     Gait: Gait normal.     Deep Tendon Reflexes: Reflexes are normal and symmetric.  Psychiatric:        Mood and Affect: Mood and affect normal.        Behavior: Behavior normal. Behavior is cooperative.        Thought Content: Thought  content normal.        Judgment: Judgment normal.        Assessment/Plan: 1. Encounter for routine adult health examination with abnormal findings (Primary) Age-appropriate preventive screenings and vaccinations discussed, annual physical exam completed. Routine labs for health maintenance ordered, see below. PHM updated.   - CBC with Differential/Platelet - CMP14+EGFR - Lipid Profile - B12 and Folate Panel - Vitamin D  (25 hydroxy) - Hgb A1C w/o eAG  2. Schizoaffective disorder, unspecified type [facility]) Sees psychiatry regularly and is on ability maintena injections and trazodone  for sleep - CBC with Differential/Platelet - CMP14+EGFR - Lipid Profile - B12 and Folate Panel - Vitamin D  (25 hydroxy) - Hgb A1C w/o eAG  3. Pre-diabetes Routine labs ordered  - CBC with Differential/Platelet - CMP14+EGFR - Lipid Profile - B12 and Folate Panel - Vitamin D  (25 hydroxy) - Hgb A1C w/o eAG  4. Elevated liver enzymes Routine labs ordered  - CBC with Differential/Platelet - CMP14+EGFR - Lipid Profile - B12 and Folate Panel - Vitamin D  (25 hydroxy) - Hgb A1C w/o eAG  5. Iron deficiency anemia, unspecified iron deficiency anemia type Routine labs ordered  - CBC with Differential/Platelet - CMP14+EGFR - Lipid Profile - B12 and Folate Panel - Vitamin D  (25 hydroxy) - Hgb A1C w/o eAG  6. Mixed hyperlipidemia Routine labs ordered  - CBC with Differential/Platelet - CMP14+EGFR - Lipid Profile - B12 and Folate Panel - Vitamin D  (25 hydroxy) - Hgb A1C w/o eAG  7. Vitamin D  deficiency Routine labs ordered  - CBC with Differential/Platelet - CMP14+EGFR - Lipid Profile - B12 and Folate Panel - Vitamin D  (25 hydroxy) - Hgb A1C w/o eAG  8. Non-seasonal allergic rhinitis due to other allergic trigger Start levocetirizine as prescribed.  - levocetirizine (XYZAL ) 5 MG tablet; Take 1 tablet (5 mg total) by mouth every evening.  Dispense: 90 tablet; Refill:  3    General  Counseling: Nikolus verbalizes understanding of the findings of todays visit and agrees with plan of treatment. I have discussed any further diagnostic evaluation that may be needed or ordered today. We also reviewed his medications today. he has been encouraged to call the office with any questions or concerns that should arise related to todays visit.    Orders Placed This Encounter  Procedures   CBC with Differential/Platelet   CMP14+EGFR   Lipid Profile   B12 and Folate Panel   Vitamin D  (25 hydroxy)   Hgb A1C w/o eAG    Meds ordered this encounter  Medications   levocetirizine (XYZAL ) 5 MG tablet    Sig: Take 1 tablet (5 mg total) by mouth every evening.    Dispense:  90 tablet    Refill:  3    Fill script today    Return in about 1 month (around 12/14/2023) for F/U, Labs, Jamesyn [place] PCP, have labs done before visit .   Total time spent:30 Minutes Time spent includes review of chart, medications, test results, and follow up plan with the patient.   [place] Controlled Substance Database was reviewed by me.  This patient was seen by Mardy Maxin, FNP-C in collaboration with Dr. Sigrid Bathe as a part of collaborative care agreement.  Boone Gear R. Maxin, MSN, FNP-C Internal medicine

## 2023-11-14 ENCOUNTER — Encounter: Payer: Self-pay | Admitting: Nurse Practitioner

## 2023-12-04 ENCOUNTER — Telehealth: Payer: Self-pay | Admitting: Psychiatry

## 2023-12-04 ENCOUNTER — Other Ambulatory Visit: Payer: Self-pay

## 2023-12-04 ENCOUNTER — Ambulatory Visit

## 2023-12-04 ENCOUNTER — Emergency Department
Admission: EM | Admit: 2023-12-04 | Discharge: 2023-12-04 | Disposition: A | Discharge status: Home / Self Care | Source: Ambulatory Visit | Attending: Emergency Medicine | Admitting: Emergency Medicine

## 2023-12-04 DIAGNOSIS — I959 Hypotension, unspecified: Secondary | ICD-10-CM | POA: Diagnosis present

## 2023-12-04 LAB — COMPREHENSIVE METABOLIC PANEL WITH GFR
ALT: 13 U/L (ref 0–44)
AST: 18 U/L (ref 15–41)
Albumin: 3.5 g/dL (ref 3.5–5.0)
Alkaline Phosphatase: 43 U/L (ref 38–126)
Anion gap: 7 (ref 5–15)
BUN: 16 mg/dL (ref 6–20)
CO2: 28 mmol/L (ref 22–32)
Calcium: 8.6 mg/dL — ABNORMAL LOW (ref 8.9–10.3)
Chloride: 103 mmol/L (ref 98–111)
Creatinine, Ser: 1.19 mg/dL (ref 0.61–1.24)
GFR, Estimated: >60 mL/min (ref >60)
Glucose, Bld: 112 mg/dL — ABNORMAL HIGH (ref 70–99)
Potassium: 3.8 mmol/L (ref 3.5–5.1)
Sodium: 138 mmol/L (ref 135–145)
Total Bilirubin: 0.7 mg/dL (ref 0.0–1.2)
Total Protein: 6.4 g/dL — ABNORMAL LOW (ref 6.5–8.1)

## 2023-12-04 LAB — CBC WITH DIFFERENTIAL/PLATELET
Abs Immature Granulocytes: 0.03 K/uL (ref 0.00–0.07)
Basophils Absolute: 0 K/uL (ref 0.0–0.1)
Basophils Relative: 0 %
Eosinophils Absolute: 0.1 K/uL (ref 0.0–0.5)
Eosinophils Relative: 1 %
HCT: 39.4 % (ref 39.0–52.0)
Hemoglobin: 13.1 g/dL (ref 13.0–17.0)
Immature Granulocytes: 0 %
Lymphocytes Relative: 39 %
Lymphs Abs: 3.2 K/uL (ref 0.7–4.0)
MCH: 34.3 pg — ABNORMAL HIGH (ref 26.0–34.0)
MCHC: 33.2 g/dL (ref 30.0–36.0)
MCV: 103.1 fL — ABNORMAL HIGH (ref 80.0–100.0)
Monocytes Absolute: 0.5 K/uL (ref 0.1–1.0)
Monocytes Relative: 6 %
Neutro Abs: 4.3 K/uL (ref 1.7–7.7)
Neutrophils Relative %: 54 %
Platelets: 158 K/uL (ref 150–400)
RBC: 3.82 MIL/uL — ABNORMAL LOW (ref 4.22–5.81)
RDW: 12.5 % (ref 11.5–15.5)
WBC: 8.1 K/uL (ref 4.0–10.5)
nRBC: 0 % (ref 0.0–0.2)

## 2023-12-04 LAB — LACTIC ACID, PLASMA: Lactic Acid, Venous: 1.6 mmol/L (ref 0.5–1.9)

## 2023-12-04 LAB — TROPONIN I (HIGH SENSITIVITY): Troponin I (High Sensitivity): 11 ng/L (ref <18)

## 2023-12-04 MED ORDER — LACTATED RINGERS IV BOLUS
1000.0000 mL | Freq: Once | INTRAVENOUS | Status: AC
  Administered 2023-12-04: 1000 mL via INTRAVENOUS

## 2023-12-04 NOTE — Telephone Encounter (Addendum)
 He presented to the clinic for regular Abilify  injection. I was called due to hypotension 75/44. (Unable to measure Sat. Pulse is weak)  He is leaning forward in his chair and acknowledges feeling tired. While he is alert, and is able to engage in conversation, it appears physically difficult for him to sit upright. He arrived alone, utilizing transportation assistance. He denies any other physical symptoms, including chest pain, fever, appetite loss. No known history of hypertension.  Will hold Abilify  injection at this time.  He will be sent by ambulance for further evaluation. Charge nurse at ED is contacted regarding this patient.

## 2023-12-04 NOTE — ED Notes (Signed)
 Called CCMD to add pt to central monitoring

## 2023-12-04 NOTE — Discharge Instructions (Addendum)
 Keep a record of the blood pressure over the next 1 to 2 weeks and subsequently follow-up with your primary care provider.  Return to the ER immediately for new, worsening, or persistent low blood pressure readings, dizziness or lightheadedness, chest pain, palpitations, difficulty breathing, feeling of giving her to pass out, or any other new or worsening symptoms that concern you.

## 2023-12-04 NOTE — ED Provider Notes (Signed)
 [facility] Provider Note    Event Date/Time   First MD Initiated Contact with Patient 12/04/23 0845     (approximate)   History   Hypotension and Weakness   HPI  Manuel Horn is a 43 y.o. male with a history of schizoaffective disorder who presents with generalized weakness and hypotension.  The patient states that he felt fine yesterday but has been feeling somewhat weak and lightheaded today.  He went to an outpatient medication visit when he was noted to have a blood pressure of 70/40 and a heart rate in the 40s.  He was given a dose of atropine by EMS.  The patient denies any chest pain, palpitations, difficulty breathing, nausea or vomiting, or acute pain.  He denies any drug or alcohol use in the last several days.   I reviewed the past medical records.  The patient has no recent hospitalizations.  His most recent outpatient encounter in our system was with internal medicine on 8/26 for an annual physical exam.  He had no acute issues at that time.   Physical Exam   Triage Vital Signs: ED Triage Vitals  Encounter Vitals Group     BP      Girls Systolic BP Percentile      Girls Diastolic BP Percentile      Boys Systolic BP Percentile      Boys Diastolic BP Percentile      Pulse      Resp      Temp      Temp src      SpO2      Weight      Height      Head Circumference      Peak Flow      Pain Score      Pain Loc      Pain Education      Exclude from Growth Chart     Most recent vital signs: Vitals:   12/04/23 1030 12/04/23 1100  BP: 102/67 102/76  Pulse: 70 74  Resp: 13 12  Temp:    SpO2: 98% 97%     General: Appearing but fully alert, oriented, no distress.  CV:  Good peripheral perfusion.  Resp:  Normal effort.  Abd:  Soft and nontender.  No distention.  Other:  EOMI.  PERRLA.  No photophobia.  No facial droop.  Normal speech.  Motor intact in all extremities.  Somewhat dry mucous membranes.   ED Results / Procedures /  Treatments   Labs (all labs ordered are listed, but only abnormal results are displayed) Labs Reviewed  COMPREHENSIVE METABOLIC PANEL WITH GFR - Abnormal; Notable for the following components:      Result Value   Glucose, Bld 112 (*)    Calcium 8.6 (*)    Total Protein 6.4 (*)    All other components within normal limits  CBC WITH DIFFERENTIAL/PLATELET - Abnormal; Notable for the following components:   RBC 3.82 (*)    MCV 103.1 (*)    MCH 34.3 (*)    All other components within normal limits  LACTIC ACID, PLASMA  URINALYSIS, ROUTINE W REFLEX MICROSCOPIC  TROPONIN I (HIGH SENSITIVITY)     EKG  ED ECG REPORT I, Waylon Cassis, the attending physician, personally viewed and interpreted this ECG.  Date: 12/04/2023 EKG Time: 0846 Rate: 60 Rhythm: normal sinus rhythm QRS Axis: normal Intervals: RBBB ST/T Wave abnormalities: normal Narrative Interpretation: no evidence of acute ischemia  RADIOLOGY    PROCEDURES:  Critical Care performed: No  Procedures   MEDICATIONS ORDERED IN ED: Medications  lactated ringers  bolus 1,000 mL (0 mLs Intravenous Stopped 12/04/23 0905)     IMPRESSION / MDM / ASSESSMENT AND PLAN / ED COURSE  I reviewed the triage vital signs and the nursing notes.  43 year old male with PMH as noted above presents with hypotension, bradycardia, generalized weakness.  Blood pressure is somewhat improved after fluids and atropine given by EMS.  EKG shows sinus rhythm.  Physical exam is unremarkable for acute findings.  Differential diagnosis includes, but is not limited to, vasovagal episode, dehydration, hypovolemia, electrolyte abnormality, other metabolic disturbance, less likely acute infection or sepsis, or primary cardiac cause.  We will obtain lab workup, give fluids, and reassess.  Patient's presentation is most consistent with acute presentation with potential threat to life or bodily function.  The patient is on the cardiac monitor  to evaluate for evidence of arrhythmia and/or significant heart rate changes.  ----------------------------------------- 12:06 PM on 12/04/2023 -----------------------------------------  Lactate and troponin are normal.  CMP shows no acute findings.  CBC is also unremarkable.  There is no evidence of sepsis or cardiac etiology.  The patient's family members are now here and states that this low blood pressure has been somewhat of a chronic recurrent problem for him.  I suspect that it may be medication related.  At this time the blood pressure is back to normal.  The patient is asymptomatic.  He feels comfortable going home.  Therefore, he is stable for discharge at this time.  I counseled him and the family on the results of the workup and plan of care.  I gave strict return precautions, and they expressed understanding.   FINAL CLINICAL IMPRESSION(S) / ED DIAGNOSES   Final diagnoses:  Hypotension, unspecified hypotension type     Rx / DC Orders   ED Discharge Orders     None        Note:  This document was prepared using Dragon voice recognition software and may include unintentional dictation errors.    Jacolyn Pae, MD 12/04/23 1207

## 2023-12-04 NOTE — ED Triage Notes (Signed)
 Pt arrives from [facility] psychiatry via [facility] where he was there to get his IM psych medication when staff noticed his BP was low, 70/40. Pt reports feeling weak this morning. EMS gave pt 1mg  of atropine at 0834 and 500mL of NS and pt's BP was then 94/57. Pt is A&Ox4 during triage.

## 2023-12-13 ENCOUNTER — Ambulatory Visit: Payer: MEDICAID | Admitting: Nurse Practitioner

## 2023-12-17 ENCOUNTER — Other Ambulatory Visit: Payer: Self-pay

## 2023-12-17 ENCOUNTER — Ambulatory Visit

## 2023-12-17 VITALS — BP 104/69 | HR 76 | Temp 97.3°F | Ht 71.0 in | Wt 150.2 lb

## 2023-12-17 DIAGNOSIS — F259 Schizoaffective disorder, unspecified: Secondary | ICD-10-CM

## 2023-12-17 NOTE — Patient Instructions (Addendum)
 Patient present flat affect mood was pleasant and denied visual or auditory hallucinations. No suicidal or homicidal ideations, no plan, intent, or means to want to harm self or others. Patients Abilify  Maintena 400 mg per vial   IM injection prepared as ordered and administered 300 mg to patient in his left deltoid Patient tolerated without discomfort or pain. Patient will return in 28 days and will call if there is any changes.  NDC 40851-752-81 LOT [id number] EXP 12-17-25

## 2023-12-17 NOTE — Progress Notes (Unsigned)
 Patient present flat affect mood was pleasant and denied visual or auditory hallucinations. No suicidal or homicidal ideations, no plan, intent, or means to want to harm self or others. Patients Abilify  Maintena 400 mg per vial   IM injection prepared as ordered and administered 300 mg to patient in his left deltoid Patient tolerated without discomfort or pain. Patient will return in 28 days and will call if there is any changes.  NDC 40851-752-81 LOT [id number] EXP 12-17-25

## 2023-12-18 ENCOUNTER — Ambulatory Visit: Payer: MEDICAID | Admitting: Nurse Practitioner

## 2023-12-18 ENCOUNTER — Encounter: Payer: Self-pay | Admitting: Nurse Practitioner

## 2023-12-18 VITALS — BP 132/80 | HR 69 | Temp 96.2°F | Resp 16 | Ht 71.0 in | Wt 150.8 lb

## 2023-12-18 DIAGNOSIS — D649 Anemia, unspecified: Secondary | ICD-10-CM

## 2023-12-18 DIAGNOSIS — R718 Other abnormality of red blood cells: Secondary | ICD-10-CM | POA: Diagnosis not present

## 2023-12-18 DIAGNOSIS — Z23 Encounter for immunization: Secondary | ICD-10-CM | POA: Diagnosis not present

## 2023-12-18 NOTE — Progress Notes (Signed)
 [facility] [address] [place], [place] [address]  Internal MEDICINE  Office Visit Note  Patient Name: Manuel Horn  [id number]  [id number]  Date of Service: 12/18/2023  Chief Complaint  Patient presents with   Follow-up    HPI Manuel Horn presents for a follow-up visit for labs and referral and flu vaccine.  Chronically low RBCs, elevated MCV and MCH. --has not seen hematology for further evaluation yet Low calcium Low total protein Elevated glucose  Requesting flu vaccine today. Reminded patient to get additional labs done that were previously ordered.      Current Medication: Outpatient Encounter Medications as of 12/18/2023  Medication Sig   ABILIFY  MAINTENA 300 MG PRSY prefilled syringe Inject 300 mg into the muscle every 28 (twenty-eight) days.   levocetirizine (XYZAL ) 5 MG tablet Take 1 tablet (5 mg total) by mouth every evening.   traZODone  (DESYREL ) 50 MG tablet Take 0.5-1 tablets (25-50 mg total) by mouth at bedtime.   Vitamin D , Ergocalciferol , (DRISDOL ) 1.25 MG (50000 UNIT) CAPS capsule Take 1 capsule (50,000 Units total) by mouth every 7 (seven) days.   Facility-Administered Encounter Medications as of 12/18/2023  Medication   ARIPiprazole  ER (ABILIFY  MAINTENA) 300 MG prefilled syringe 300 mg    Surgical History: Past Surgical History:  Procedure Laterality Date   CHOLECYSTECTOMY     ENDOSCOPIC RETROGRADE CHOLANGIOPANCREATOGRAPHY (ERCP) WITH PROPOFOL  N/A 01/20/2020   Procedure: ENDOSCOPIC RETROGRADE CHOLANGIOPANCREATOGRAPHY (ERCP) WITH PROPOFOL ;  Surgeon: Jinny Carmine, MD;  Location: [facility] ENDOSCOPY;  Service: Endoscopy;  Laterality: N/A;   ERCP N/A 02/17/2020   Procedure: ENDOSCOPIC RETROGRADE CHOLANGIOPANCREATOGRAPHY (ERCP);  Surgeon: Jinny Carmine, MD;  Location: [facility] ENDOSCOPY;  Service: Endoscopy;  Laterality: N/A;   IR PERCUTANEOUS TRANSHEPATIC CHOLANGIOGRAM  01/21/2020   LEG SURGERY Left     Medical History: Past Medical History:  Diagnosis  Date   Schizoaffective disorder (HCC)     Family History: Family History  Problem Relation Age of Onset   Diabetes Mother    Hypertension Mother    Depression Maternal Aunt    Schizophrenia Maternal Uncle     Social History   Socioeconomic History   Marital status: Single    Spouse name: Not on file   Number of children: 0   Years of education: Not on file   Highest education level: Not on file  Occupational History   Occupation: disability  Tobacco Use   Smoking status: Every Day    Current packs/day: 2.00    Types: Cigarettes   Smokeless tobacco: Never   Tobacco comments:    1.5 packs a day  Vaping Use   Vaping status: Never Used  Substance and Sexual Activity   Alcohol use: No   Drug use: No   Sexual activity: Not Currently  Other Topics Concern   Not on file  Social History Narrative   Not on file   Social Drivers of Health   Financial Resource Strain: Not on file  Food Insecurity: Not on file  Transportation Needs: Not on file  Physical Activity: Not on file  Stress: Not on file  Social Connections: Not on file  Intimate Partner Violence: Not on file      Review of Systems  Constitutional:  Negative for appetite change, chills, fatigue and fever.  HENT: Negative.    Respiratory: Negative.  Negative for cough, chest tightness, shortness of breath and wheezing.   Cardiovascular:  Negative for chest pain and palpitations.  Genitourinary: Negative.   Musculoskeletal: Negative.  Vital Signs: BP 132/80   Pulse 69   Temp (!) 96.2 F (35.7 C)   Resp 16   Ht 5' 11 (1.803 m)   Wt 150 lb 12.8 oz (68.4 kg)   SpO2 98%   BMI 21.03 kg/m    Physical Exam Vitals reviewed.  Constitutional:      General: He is not in acute distress.    Appearance: Normal appearance. He is normal weight. He is not ill-appearing.  HENT:     Head: Normocephalic and atraumatic.  Eyes:     General: No scleral icterus.    Pupils: Pupils are equal, round, and  reactive to light.  Cardiovascular:     Rate and Rhythm: Normal rate and regular rhythm.  Pulmonary:     Effort: Pulmonary effort is normal. No respiratory distress.  Neurological:     Mental Status: He is alert and oriented to person, place, and time.  Psychiatric:        Mood and Affect: Mood normal.        Behavior: Behavior normal.        Assessment/Plan: 1. Anemia, unspecified type (Primary) Referred to hematology for further evaluation  - Ambulatory referral to Hematology / Oncology  2. Elevated MCV Referred to hematology for further evaluation  - Ambulatory referral to Hematology / Oncology  3. Flu vaccine need Flu vaccine administered in office today - Influenza, MDCK, trivalent, PF(Flucelvax egg-free)   General Counseling: Manuel Horn verbalizes understanding of the findings of todays visit and agrees with plan of treatment. I have discussed any further diagnostic evaluation that may be needed or ordered today. We also reviewed his medications today. he has been encouraged to call the office with any questions or concerns that should arise related to todays visit.    Orders Placed This Encounter  Procedures   Influenza, MDCK, trivalent, PF(Flucelvax egg-free)   Ambulatory referral to Hematology / Oncology    No orders of the defined types were placed in this encounter.   Return for Manuel Horn PCP in august next year and otherwise as needed, will call with lab results .   Total time spent:30 Minutes Time spent includes review of chart, medications, test results, and follow up plan with the patient.    Controlled Substance Database was reviewed by me.  This patient was seen by Horn Maxin, FNP-C in collaboration with Dr. Sigrid Horn as a part of collaborative care agreement.   Manuel Augusta R. Maxin, MSN, FNP-C Internal medicine

## 2023-12-21 LAB — CBC WITH DIFFERENTIAL/PLATELET
Basophils Absolute: 0.1 x10E3/uL (ref 0.0–0.2)
Basos: 0 %
EOS (ABSOLUTE): 0.1 x10E3/uL (ref 0.0–0.4)
Eos: 1 %
Hematocrit: 43.1 % (ref 37.5–51.0)
Hemoglobin: 14.6 g/dL (ref 13.0–17.7)
Immature Grans (Abs): 0 x10E3/uL (ref 0.0–0.1)
Immature Granulocytes: 0 %
Lymphocytes Absolute: 3.9 x10E3/uL — ABNORMAL HIGH (ref 0.7–3.1)
Lymphs: 33 %
MCH: 34.9 pg — ABNORMAL HIGH (ref 26.6–33.0)
MCHC: 33.9 g/dL (ref 31.5–35.7)
MCV: 103 fL — ABNORMAL HIGH (ref 79–97)
Monocytes Absolute: 0.7 x10E3/uL (ref 0.1–0.9)
Monocytes: 6 %
Neutrophils Absolute: 7 x10E3/uL (ref 1.4–7.0)
Neutrophils: 60 %
Platelets: 205 x10E3/uL (ref 150–450)
RBC: 4.18 x10E6/uL (ref 4.14–5.80)
RDW: 11.7 % (ref 11.6–15.4)
WBC: 11.8 x10E3/uL — ABNORMAL HIGH (ref 3.4–10.8)

## 2023-12-21 LAB — CMP14+EGFR
ALT: 25 IU/L (ref 0–44)
AST: 25 IU/L (ref 0–40)
Albumin: 4.6 g/dL (ref 4.1–5.1)
Alkaline Phosphatase: 73 IU/L (ref 47–123)
BUN/Creatinine Ratio: 10 (ref 9–20)
BUN: 11 mg/dL (ref 6–24)
Bilirubin Total: 0.6 mg/dL (ref 0.0–1.2)
CO2: 25 mmol/L (ref 20–29)
Calcium: 9.3 mg/dL (ref 8.7–10.2)
Chloride: 100 mmol/L (ref 96–106)
Creatinine, Ser: 1.06 mg/dL (ref 0.76–1.27)
Globulin, Total: 2.7 g/dL (ref 1.5–4.5)
Glucose: 102 mg/dL — ABNORMAL HIGH (ref 70–99)
Potassium: 4.4 mmol/L (ref 3.5–5.2)
Sodium: 139 mmol/L (ref 134–144)
Total Protein: 7.3 g/dL (ref 6.0–8.5)
eGFR: 89 mL/min/1.73 (ref >59)

## 2023-12-21 LAB — LIPID PANEL
Chol/HDL Ratio: 3.1 ratio (ref 0.0–5.0)
Cholesterol, Total: 116 mg/dL (ref 100–199)
HDL: 38 mg/dL — ABNORMAL LOW (ref >39)
LDL Chol Calc (NIH): 66 mg/dL (ref 0–99)
Triglycerides: 54 mg/dL (ref 0–149)
VLDL Cholesterol Cal: 12 mg/dL (ref 5–40)

## 2023-12-21 LAB — B12 AND FOLATE PANEL
Folate: 10.2 ng/mL (ref >3.0)
Vitamin B-12: 647 pg/mL (ref 232–1245)

## 2023-12-21 LAB — VITAMIN D 25 HYDROXY (VIT D DEFICIENCY, FRACTURES): Vit D, 25-Hydroxy: 38.9 ng/mL (ref 30.0–100.0)

## 2023-12-21 LAB — HGB A1C W/O EAG: Hgb A1c MFr Bld: 5.8 % — ABNORMAL HIGH (ref 4.8–5.6)

## 2024-01-01 ENCOUNTER — Inpatient Hospital Stay: Payer: MEDICAID | Attending: Oncology | Admitting: Oncology

## 2024-01-01 ENCOUNTER — Ambulatory Visit

## 2024-01-01 ENCOUNTER — Encounter: Payer: Self-pay | Admitting: Oncology

## 2024-01-01 ENCOUNTER — Inpatient Hospital Stay: Payer: MEDICAID

## 2024-01-01 VITALS — BP 103/73 | HR 64 | Temp 97.4°F | Resp 16 | Ht 71.0 in | Wt 148.0 lb

## 2024-01-01 DIAGNOSIS — R899 Unspecified abnormal finding in specimens from other organs, systems and tissues: Secondary | ICD-10-CM

## 2024-01-01 DIAGNOSIS — Z818 Family history of other mental and behavioral disorders: Secondary | ICD-10-CM | POA: Insufficient documentation

## 2024-01-01 DIAGNOSIS — Z833 Family history of diabetes mellitus: Secondary | ICD-10-CM | POA: Insufficient documentation

## 2024-01-01 DIAGNOSIS — Z79899 Other long term (current) drug therapy: Secondary | ICD-10-CM | POA: Diagnosis not present

## 2024-01-01 DIAGNOSIS — F259 Schizoaffective disorder, unspecified: Secondary | ICD-10-CM | POA: Diagnosis not present

## 2024-01-01 DIAGNOSIS — Z882 Allergy status to sulfonamides status: Secondary | ICD-10-CM | POA: Insufficient documentation

## 2024-01-01 DIAGNOSIS — Z862 Personal history of diseases of the blood and blood-forming organs and certain disorders involving the immune mechanism: Secondary | ICD-10-CM | POA: Insufficient documentation

## 2024-01-01 DIAGNOSIS — R718 Other abnormality of red blood cells: Secondary | ICD-10-CM | POA: Diagnosis not present

## 2024-01-01 DIAGNOSIS — R7989 Other specified abnormal findings of blood chemistry: Secondary | ICD-10-CM | POA: Diagnosis not present

## 2024-01-01 DIAGNOSIS — Z9049 Acquired absence of other specified parts of digestive tract: Secondary | ICD-10-CM | POA: Insufficient documentation

## 2024-01-01 DIAGNOSIS — F17210 Nicotine dependence, cigarettes, uncomplicated: Secondary | ICD-10-CM | POA: Diagnosis not present

## 2024-01-01 DIAGNOSIS — Z8249 Family history of ischemic heart disease and other diseases of the circulatory system: Secondary | ICD-10-CM | POA: Diagnosis not present

## 2024-01-01 LAB — CBC (CANCER CENTER ONLY)
HCT: 41.6 % (ref 39.0–52.0)
Hemoglobin: 14.3 g/dL (ref 13.0–17.0)
MCH: 34.5 pg — ABNORMAL HIGH (ref 26.0–34.0)
MCHC: 34.4 g/dL (ref 30.0–36.0)
MCV: 100.5 fL — ABNORMAL HIGH (ref 80.0–100.0)
Platelet Count: 195 K/uL (ref 150–400)
RBC: 4.14 MIL/uL — ABNORMAL LOW (ref 4.22–5.81)
RDW: 12.3 % (ref 11.5–15.5)
WBC Count: 9.9 K/uL (ref 4.0–10.5)
nRBC: 0 % (ref 0.0–0.2)

## 2024-01-01 LAB — VITAMIN B12: Vitamin B-12: 350 pg/mL (ref 180–914)

## 2024-01-01 LAB — LACTATE DEHYDROGENASE: LDH: 105 U/L (ref 98–192)

## 2024-01-01 LAB — IRON AND TIBC
Iron: 101 ug/dL (ref 45–182)
Saturation Ratios: 30 % (ref 17.9–39.5)
TIBC: 335 ug/dL (ref 250–450)
UIBC: 234 ug/dL

## 2024-01-01 LAB — FERRITIN: Ferritin: 74 ng/mL (ref 24–336)

## 2024-01-01 NOTE — Progress Notes (Signed)
 [facility]  Telephone:(336) [phone] Fax:(336) [phone]  ID: Manuel Horn OB: 1980-04-21  MR#: [id number]  RDW#:[id number]  Patient Care Team: Liana Fish, NP as PCP - General (Nurse Practitioner) Fernand Sigrid HERO, MD (Internal Medicine)  CHIEF COMPLAINT: Abnormal labs.  INTERVAL HISTORY: Patient is a 43 year old schizophrenic male who was noted to have a mildly elevated MCV as well as other abnormal laboratory work on routine CBC.  He is accompanied with his mother today.  He is referred for further evaluation.  Currently feels well and is asymptomatic.  He does not complain of any weakness fatigue.  He denies any recent fevers or illnesses.  He has a good appetite and denies weight loss.  He has no neurologic complaints.  He denies any chest pain, shortness of breath, cough, or hemoptysis.  He denies any nausea, vomiting, constipation, or diarrhea.  He has no urinary complaints.  Patient is at his baseline and offers no specific complaints today.  REVIEW OF SYSTEMS:   Review of Systems  Constitutional: Negative.  Negative for fever, malaise/fatigue and weight loss.  Respiratory: Negative.  Negative for cough, hemoptysis and shortness of breath.   Cardiovascular: Negative.  Negative for chest pain and leg swelling.  Gastrointestinal: Negative.  Negative for abdominal pain.  Genitourinary: Negative.  Negative for dysuria.  Musculoskeletal: Negative.  Negative for back pain.  Skin: Negative.  Negative for rash.  Neurological: Negative.  Negative for dizziness, focal weakness, weakness and headaches.  Psychiatric/Behavioral: Negative.  The patient is not nervous/anxious.     As per HPI. Otherwise, a complete review of systems is negative.  PAST MEDICAL HISTORY: Past Medical History:  Diagnosis Date   Schizoaffective disorder (HCC)     PAST SURGICAL HISTORY: Past Surgical History:  Procedure Laterality Date   CHOLECYSTECTOMY     ENDOSCOPIC RETROGRADE  CHOLANGIOPANCREATOGRAPHY (ERCP) WITH PROPOFOL  N/A 01/20/2020   Procedure: ENDOSCOPIC RETROGRADE CHOLANGIOPANCREATOGRAPHY (ERCP) WITH PROPOFOL ;  Surgeon: Jinny Carmine, MD;  Location: [facility] ENDOSCOPY;  Service: Endoscopy;  Laterality: N/A;   ERCP N/A 02/17/2020   Procedure: ENDOSCOPIC RETROGRADE CHOLANGIOPANCREATOGRAPHY (ERCP);  Surgeon: Jinny Carmine, MD;  Location: [facility] ENDOSCOPY;  Service: Endoscopy;  Laterality: N/A;   IR PERCUTANEOUS TRANSHEPATIC CHOLANGIOGRAM  01/21/2020   LEG SURGERY Left     FAMILY HISTORY: Family History  Problem Relation Age of Onset   Diabetes Mother    Hypertension Mother    Depression Maternal Aunt    Schizophrenia Maternal Uncle     ADVANCED DIRECTIVES (Y/N):  N  HEALTH MAINTENANCE: Social History   Tobacco Use   Smoking status: Every Day    Current packs/day: 2.00    Types: Cigarettes   Smokeless tobacco: Never   Tobacco comments:    1.5 packs a day  Vaping Use   Vaping status: Never Used  Substance Use Topics   Alcohol use: No   Drug use: No     Colonoscopy:  PAP:  Bone density:  Lipid panel:  Allergies  Allergen Reactions   Sulfa Antibiotics Rash    Current Outpatient Medications  Medication Sig Dispense Refill   ABILIFY  MAINTENA 300 MG PRSY prefilled syringe Inject 300 mg into the muscle every 28 (twenty-eight) days. 1 each 5   levocetirizine (XYZAL ) 5 MG tablet Take 1 tablet (5 mg total) by mouth every evening. 90 tablet 3   traZODone  (DESYREL ) 50 MG tablet Take 0.5-1 tablets (25-50 mg total) by mouth at bedtime. 30 tablet 3   Vitamin D , Ergocalciferol , (DRISDOL ) 1.25 MG (50000  UNIT) CAPS capsule Take 1 capsule (50,000 Units total) by mouth every 7 (seven) days. 12 capsule 1   Current Facility-Administered Medications  Medication Dose Route Frequency Provider Last Rate Last Admin   ARIPiprazole  ER (ABILIFY  MAINTENA) 300 MG prefilled syringe 300 mg  300 mg Intramuscular Q28 days    300 mg at 12/17/23 0816    OBJECTIVE: Vitals:    01/01/24 1127  BP: 103/73  Pulse: 64  Resp: 16  Temp: (!) 97.4 F (36.3 C)  SpO2: 100%     Body mass index is 20.64 kg/m.    ECOG FS:0 - Asymptomatic  General: Well-developed, well-nourished, no acute distress. Eyes: Pink conjunctiva, anicteric sclera. HEENT: Normocephalic, moist mucous membranes. Lungs: No audible wheezing or coughing. Heart: Regular rate and rhythm. Abdomen: Soft, nontender, no obvious distention. Musculoskeletal: No edema, cyanosis, or clubbing. Neuro: Alert, answering all questions appropriately. Cranial nerves grossly intact. Skin: No rashes or petechiae noted. Psych: Normal affect. Lymphatics: No cervical, calvicular, axillary or inguinal LAD.   LAB RESULTS:  Lab Results  Component Value Date   NA 139 12/20/2023   K 4.4 12/20/2023   CL 100 12/20/2023   CO2 25 12/20/2023   GLUCOSE 102 (H) 12/20/2023   BUN 11 12/20/2023   CREATININE 1.06 12/20/2023   CALCIUM 9.3 12/20/2023   PROT 7.3 12/20/2023   ALBUMIN 4.6 12/20/2023   AST 25 12/20/2023   ALT 25 12/20/2023   ALKPHOS 73 12/20/2023   BILITOT 0.6 12/20/2023   GFRNONAA >60 12/04/2023   GFRAA 93 01/19/2020    Lab Results  Component Value Date   WBC 9.9 01/01/2024   NEUTROABS 7.0 12/20/2023   HGB 14.3 01/01/2024   HCT 41.6 01/01/2024   MCV 100.5 (H) 01/01/2024   PLT 195 01/01/2024     STUDIES: No results found.  ASSESSMENT: Abnormal labs.  PLAN:    Abnormal labs: Patient has a mildly elevated MCV, but the remainder of the CBC is completely within normal limits.  B12, folate, iron stores, and hemolysis labs are all within normal limits.  Possibly medication induced.  No intervention is needed at this time.  Patient does not require bone marrow biopsy.  Return to clinic in 3 weeks for further evaluation and discussion of his results. Leukocytosis: Resolved.  Have ordered peripheral blood flow cytometry today for completeness.  I spent a total of 45 minutes reviewing chart data,  face-to-face evaluation with the patient, counseling and coordination of care as detailed above.  Patient expressed understanding and was in agreement with this plan. He also understands that He can call clinic at any time with any questions, concerns, or complaints.    Manuel JINNY Reusing, MD   01/01/2024 1:24 PM

## 2024-01-01 NOTE — Progress Notes (Signed)
 Pt referred for low RBC and elevated MCV and MCH. Pt has schizophrenia and mother is present today, which whom he lives with.

## 2024-01-04 LAB — COMP PANEL: LEUKEMIA/LYMPHOMA

## 2024-01-14 ENCOUNTER — Ambulatory Visit (INDEPENDENT_AMBULATORY_CARE_PROVIDER_SITE_OTHER): Payer: PRIVATE HEALTH INSURANCE

## 2024-01-14 ENCOUNTER — Other Ambulatory Visit: Payer: Self-pay

## 2024-01-14 VITALS — BP 98/67 | HR 74 | Temp 97.3°F | Ht 71.0 in | Wt 147.4 lb

## 2024-01-14 DIAGNOSIS — F259 Schizoaffective disorder, unspecified: Secondary | ICD-10-CM

## 2024-01-14 NOTE — Progress Notes (Unsigned)
 Patient present flat affect mood was pleasant and denied visual or auditory hallucinations. No suicidal or homicidal ideations, no plan, intent, or means to want to harm self or others. Patients Abilify  Maintena 300 MG   IM injection prepared as ordered and administered 300 mg to patient in his right deltoid Patient tolerated without discomfort or pain. Patient will return in 28 days and will call if there is any changes.   NDC 40851-854-19 LOT [id number] EXP 02-16-26 SN [id number] GTIN [id number]

## 2024-01-14 NOTE — Patient Instructions (Signed)
 Patient present flat affect mood was pleasant and denied visual or auditory hallucinations. No suicidal or homicidal ideations, no plan, intent, or means to want to harm self or others. Patients Abilify  Maintena 300 MG   IM injection prepared as ordered and administered 300 mg to patient in his right deltoid Patient tolerated without discomfort or pain. Patient will return in 28 days and will call if there is any changes.   NDC 40851-854-19 LOT [id number] EXP 02-16-26 SN [id number] GTIN [id number]

## 2024-01-16 ENCOUNTER — Telehealth: Payer: Self-pay | Admitting: Nurse Practitioner

## 2024-01-16 NOTE — Telephone Encounter (Signed)
 0101/2025 -01/16/24 MR records uploaded to [employer] @ 11:43 a.m-Toni

## 2024-01-22 ENCOUNTER — Encounter: Payer: Self-pay | Admitting: Oncology

## 2024-01-22 ENCOUNTER — Inpatient Hospital Stay: Payer: MEDICAID | Attending: Oncology | Admitting: Oncology

## 2024-01-22 VITALS — BP 106/80 | HR 67 | Temp 96.5°F | Resp 18 | Ht 71.0 in | Wt 149.0 lb

## 2024-01-22 DIAGNOSIS — F259 Schizoaffective disorder, unspecified: Secondary | ICD-10-CM | POA: Insufficient documentation

## 2024-01-22 DIAGNOSIS — D72829 Elevated white blood cell count, unspecified: Secondary | ICD-10-CM | POA: Diagnosis not present

## 2024-01-22 DIAGNOSIS — Z8249 Family history of ischemic heart disease and other diseases of the circulatory system: Secondary | ICD-10-CM | POA: Insufficient documentation

## 2024-01-22 DIAGNOSIS — D7589 Other specified diseases of blood and blood-forming organs: Secondary | ICD-10-CM | POA: Insufficient documentation

## 2024-01-22 DIAGNOSIS — Z833 Family history of diabetes mellitus: Secondary | ICD-10-CM | POA: Insufficient documentation

## 2024-01-22 DIAGNOSIS — Z818 Family history of other mental and behavioral disorders: Secondary | ICD-10-CM | POA: Insufficient documentation

## 2024-01-22 DIAGNOSIS — Z9049 Acquired absence of other specified parts of digestive tract: Secondary | ICD-10-CM | POA: Insufficient documentation

## 2024-01-22 DIAGNOSIS — Z79899 Other long term (current) drug therapy: Secondary | ICD-10-CM | POA: Insufficient documentation

## 2024-01-22 DIAGNOSIS — F17210 Nicotine dependence, cigarettes, uncomplicated: Secondary | ICD-10-CM | POA: Diagnosis not present

## 2024-01-22 DIAGNOSIS — R899 Unspecified abnormal finding in specimens from other organs, systems and tissues: Secondary | ICD-10-CM | POA: Diagnosis not present

## 2024-01-22 DIAGNOSIS — R718 Other abnormality of red blood cells: Secondary | ICD-10-CM | POA: Diagnosis present

## 2024-01-22 DIAGNOSIS — Z882 Allergy status to sulfonamides status: Secondary | ICD-10-CM | POA: Diagnosis not present

## 2024-01-22 NOTE — Progress Notes (Unsigned)
 [facility]  Telephone:(336) [phone] Fax:(336) [phone]  ID: Manuel Horn Pizza OB: [birth year]  MR#: [id number]  RDW#:[id number]  Patient Care Team: Liana Fish, NP as PCP - General (Nurse Practitioner) Fernand Sigrid HERO, MD (Internal Medicine)  CHIEF COMPLAINT: Abnormal labs.  INTERVAL HISTORY: Patient returns to clinic today for further evaluation and discussion of his laboratory results.  He once again is accompanied by his mother and much of the history is given by her.  He currently feels well and is asymptomatic.  He denies any weakness or fatigue.  He denies any recent fevers or illnesses.  He has a good appetite and denies weight loss.  He has no neurologic complaints.  He denies any chest pain, shortness of breath, cough, or hemoptysis.  He denies any nausea, vomiting, constipation, or diarrhea.  He has no urinary complaints.  Patient offers no specific complaints today.  REVIEW OF SYSTEMS:   Review of Systems  Constitutional: Negative.  Negative for fever, malaise/fatigue and weight loss.  Respiratory: Negative.  Negative for cough, hemoptysis and shortness of breath.   Cardiovascular: Negative.  Negative for chest pain and leg swelling.  Gastrointestinal: Negative.  Negative for abdominal pain.  Genitourinary: Negative.  Negative for dysuria.  Musculoskeletal: Negative.  Negative for back pain.  Skin: Negative.  Negative for rash.  Neurological: Negative.  Negative for dizziness, focal weakness, weakness and headaches.  Psychiatric/Behavioral: Negative.  The patient is not nervous/anxious.     As per HPI. Otherwise, a complete review of systems is negative.  PAST MEDICAL HISTORY: Past Medical History:  Diagnosis Date   Schizoaffective disorder (HCC)     PAST SURGICAL HISTORY: Past Surgical History:  Procedure Laterality Date   CHOLECYSTECTOMY     ENDOSCOPIC RETROGRADE CHOLANGIOPANCREATOGRAPHY (ERCP) WITH PROPOFOL  N/A 01/20/2020   Procedure: ENDOSCOPIC  RETROGRADE CHOLANGIOPANCREATOGRAPHY (ERCP) WITH PROPOFOL ;  Surgeon: Jinny Carmine, MD;  Location: [facility] ENDOSCOPY;  Service: Endoscopy;  Laterality: N/A;   ERCP N/A 02/17/2020   Procedure: ENDOSCOPIC RETROGRADE CHOLANGIOPANCREATOGRAPHY (ERCP);  Surgeon: Jinny Carmine, MD;  Location: [facility] ENDOSCOPY;  Service: Endoscopy;  Laterality: N/A;   IR PERCUTANEOUS TRANSHEPATIC CHOLANGIOGRAM  01/21/2020   LEG SURGERY Left     FAMILY HISTORY: Family History  Problem Relation Age of Onset   Diabetes Mother    Hypertension Mother    Depression Maternal Aunt    Schizophrenia Maternal Uncle     ADVANCED DIRECTIVES (Y/N):  N  HEALTH MAINTENANCE: Social History   Tobacco Use   Smoking status: Every Day    Current packs/day: 2.00    Types: Cigarettes   Smokeless tobacco: Never   Tobacco comments:    1.5 packs a day  Vaping Use   Vaping status: Never Used  Substance Use Topics   Alcohol use: No   Drug use: No     Colonoscopy:  PAP:  Bone density:  Lipid panel:  Allergies  Allergen Reactions   Sulfa Antibiotics Rash    Current Outpatient Medications  Medication Sig Dispense Refill   ABILIFY  MAINTENA 300 MG PRSY prefilled syringe Inject 300 mg into the muscle every 28 (twenty-eight) days. 1 each 5   levocetirizine (XYZAL ) 5 MG tablet Take 1 tablet (5 mg total) by mouth every evening. 90 tablet 3   traZODone  (DESYREL ) 50 MG tablet Take 0.5-1 tablets (25-50 mg total) by mouth at bedtime. 30 tablet 3   Vitamin D , Ergocalciferol , (DRISDOL ) 1.25 MG (50000 UNIT) CAPS capsule Take 1 capsule (50,000 Units total) by mouth every 7 (seven)  days. 12 capsule 1   Current Facility-Administered Medications  Medication Dose Route Frequency Provider Last Rate Last Admin   ARIPiprazole  ER (ABILIFY  MAINTENA) 300 MG prefilled syringe 300 mg  300 mg Intramuscular Q28 days    300 mg at 01/14/24 0820    OBJECTIVE: Vitals:   01/22/24 1058  BP: 106/80  Pulse: 67  Resp: 18  Temp: (!) 96.5 F (35.8 C)   SpO2: 100%     Body mass index is 20.78 kg/m.    ECOG FS:0 - Asymptomatic  General: Well-developed, well-nourished, no acute distress. Eyes: Pink conjunctiva, anicteric sclera. HEENT: Normocephalic, moist mucous membranes. Lungs: No audible wheezing or coughing. Heart: Regular rate and rhythm. Abdomen: Soft, nontender, no obvious distention. Musculoskeletal: No edema, cyanosis, or clubbing. Neuro: Alert, answering all questions appropriately. Cranial nerves grossly intact. Skin: No rashes or petechiae noted. Psych: Normal affect.  LAB RESULTS:  Lab Results  Component Value Date   NA 139 12/20/2023   K 4.4 12/20/2023   CL 100 12/20/2023   CO2 25 12/20/2023   GLUCOSE 102 (H) 12/20/2023   BUN 11 12/20/2023   CREATININE 1.06 12/20/2023   CALCIUM 9.3 12/20/2023   PROT 7.3 12/20/2023   ALBUMIN 4.6 12/20/2023   AST 25 12/20/2023   ALT 25 12/20/2023   ALKPHOS 73 12/20/2023   BILITOT 0.6 12/20/2023   GFRNONAA >60 12/04/2023   GFRAA 93 01/19/2020    Lab Results  Component Value Date   WBC 9.9 01/01/2024   NEUTROABS 7.0 12/20/2023   HGB 14.3 01/01/2024   HCT 41.6 01/01/2024   MCV 100.5 (H) 01/01/2024   PLT 195 01/01/2024     STUDIES: No results found.  ASSESSMENT: Abnormal labs.  PLAN:    Macrocytosis: Patient has a mildly elevated MCV, but the remainder of the CBC is completely within normal limits.  B12, folate, iron stores, and hemolysis labs are all within normal limits.  Possibly medication induced.  No intervention is needed at this time.  Patient does not require bone marrow biopsy.  No further follow-up is necessary. Leukocytosis: Resolved.  Peripheral blood flow cytometry is negative.  I spent a total of 20 minutes reviewing chart data, face-to-face evaluation with the patient, counseling and coordination of care as detailed above.   Patient expressed understanding and was in agreement with this plan. He also understands that He can call clinic at any time  with any questions, concerns, or complaints.    Evalene JINNY Reusing, MD   01/22/2024 11:10 AM
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
## 2024-01-29 ENCOUNTER — Ambulatory Visit (INDEPENDENT_AMBULATORY_CARE_PROVIDER_SITE_OTHER): Payer: MEDICAID | Admitting: Psychiatry

## 2024-01-29 ENCOUNTER — Encounter: Payer: Self-pay | Admitting: Psychiatry

## 2024-01-29 VITALS — BP 102/72 | HR 74 | Temp 97.5°F | Ht 70.5 in | Wt 149.0 lb

## 2024-01-29 DIAGNOSIS — G4700 Insomnia, unspecified: Secondary | ICD-10-CM

## 2024-01-29 DIAGNOSIS — F259 Schizoaffective disorder, unspecified: Secondary | ICD-10-CM

## 2024-01-29 MED ORDER — ABILIFY MAINTENA 300 MG IM PRSY: 300.0000 mg | PREFILLED_SYRINGE | INTRAMUSCULAR | 5 refills | Status: AC

## 2024-01-29 NOTE — Patient Instructions (Signed)
 Continue Abilify  300 mg IM every 28 days  Continue Trazodone  25-50 mg at night  Next appointment: 3/9 at 10 AM

## 2024-02-11 ENCOUNTER — Ambulatory Visit: Payer: PRIVATE HEALTH INSURANCE

## 2024-02-12 ENCOUNTER — Ambulatory Visit: Payer: MEDICAID

## 2024-02-12 ENCOUNTER — Other Ambulatory Visit: Payer: Self-pay

## 2024-02-12 VITALS — BP 106/67 | HR 66 | Temp 97.5°F | Ht 71.0 in | Wt 150.0 lb

## 2024-02-12 DIAGNOSIS — F259 Schizoaffective disorder, unspecified: Secondary | ICD-10-CM

## 2024-02-12 NOTE — Progress Notes (Signed)
 Pt Presents today for injection of Abilify  Maintenna 300mg . This was administered in Patients left deltoid with no complaints. Pt denies all AVH, SI, and HI. Pt states that he is doing good overall and presents to be one with self. Patient states that he enjoys watching movies and walking to the store in the free time. During this time pt states that medications have been going good for him and not running out at the end of the month no known motor movements thus far to have occur taking the Antipsychotic he states, medicine has been dong good for Pt overall.   JNL

## 2024-03-10 ENCOUNTER — Ambulatory Visit: Payer: PRIVATE HEALTH INSURANCE

## 2024-03-11 ENCOUNTER — Ambulatory Visit: Payer: PRIVATE HEALTH INSURANCE

## 2024-03-17 ENCOUNTER — Ambulatory Visit: Payer: PRIVATE HEALTH INSURANCE

## 2024-03-17 VITALS — BP 118/60 | HR 74 | Temp 97.5°F | Ht 71.0 in | Wt 155.6 lb

## 2024-03-17 DIAGNOSIS — F259 Schizoaffective disorder, unspecified: Secondary | ICD-10-CM

## 2024-03-17 NOTE — Progress Notes (Unsigned)
 Pt is here for ARIPiprazole  ER (ABILIFY  MAINTENA 300 MG prefilled syringe 300 mg injection. Presents with flat affect mood was pleasant and denied visual or auditory hallucinations. No suicidal or homicidal ideations, no plan, intent, or means to want to harm self or others. Patients injection prepared as per instructions administered to patient in Right Deltoid .Patient tolerated without discomfort or pain.Pt tolerated injection well no concerns or complaints. Patient will return in and will call if there is any changes.

## 2024-04-14 ENCOUNTER — Ambulatory Visit: Payer: PRIVATE HEALTH INSURANCE

## 2024-04-17 ENCOUNTER — Ambulatory Visit: Payer: PRIVATE HEALTH INSURANCE

## 2024-04-17 ENCOUNTER — Telehealth: Payer: Self-pay | Admitting: Psychiatry

## 2024-04-17 ENCOUNTER — Other Ambulatory Visit: Payer: Self-pay

## 2024-04-17 ENCOUNTER — Other Ambulatory Visit: Payer: Self-pay | Admitting: Psychiatry

## 2024-04-17 VITALS — BP 123/83 | HR 66 | Temp 97.3°F | Ht 71.0 in | Wt 150.4 lb

## 2024-04-17 DIAGNOSIS — F259 Schizoaffective disorder, unspecified: Secondary | ICD-10-CM

## 2024-04-17 DIAGNOSIS — Z79899 Other long term (current) drug therapy: Secondary | ICD-10-CM

## 2024-04-17 NOTE — Patient Instructions (Signed)
 Patient present flat affect mood was pleasant and denied visual or auditory hallucinations. No suicidal or homicidal ideations, no plan, intent, or means to want to harm self or others. Patients Abilify  Maintena 300 mg  IM injection prepared as ordered and administered to patient in his left  deltoid. Patient tolerated without discomfort or pain. Patient will return in 28 days and will call if there is any changes.    NDC - 40851-954-19 GTIN - [id number] S/N - [id number] EXP - 05-18-2026 LOT- [id number]

## 2024-04-17 NOTE — Progress Notes (Signed)
 Patient present flat affect mood was pleasant and denied visual or auditory hallucinations. No suicidal or homicidal ideations, no plan, intent, or means to want to harm self or others. Patients Abilify  Maintena 300 mg  IM injection prepared as ordered and administered to patient in his left  deltoid. Patient tolerated without discomfort or pain. Patient will return in 28 days and will call if there is any changes.    NDC - 40851-954-19 GTIN - [id number] S/N - [id number] EXP - 05-18-2026 LOT- [id number]

## 2024-04-17 NOTE — Telephone Encounter (Addendum)
 Notified from the nurse due to HR 56 (later went up to 66) before receiving injection.  Per chart review, EKG 9/.2025 showed HR 60, sinus rhythm with APC, RBBB with QTc reportedly be 535 msec. (QTc 455 msec, HR 59,  RBBB 10/2022) However, manual measurement of the corrected QTc is difficult due to baseline noise.  Both the patient and his mother were notified of the above and risk of QTc prolongation from injection. They agreed to proceed with obtaining another EKG.    - Obtain EKG.  please call [phone] to make an appointment (number was given)

## 2024-04-23 ENCOUNTER — Ambulatory Visit
Admission: RE | Admit: 2024-04-23 | Discharge: 2024-04-23 | Disposition: A | Payer: MEDICAID | Source: Ambulatory Visit | Attending: Psychiatry

## 2024-04-23 ENCOUNTER — Ambulatory Visit: Payer: Self-pay | Admitting: Psychiatry

## 2024-04-23 NOTE — Progress Notes (Signed)
Patient confirmed and verbalized understanding.  

## 2024-04-23 NOTE — Progress Notes (Signed)
 Please advise that his EKG is acceptable to continue Abilify  injection. Advise him to continue his current medications as previously advised. If he experiences any chest pain, shortness of breath, or dizziness, please advise him to contact his primary care provider for further evaluation.

## 2024-05-15 ENCOUNTER — Ambulatory Visit: Payer: MEDICAID

## 2024-05-26 ENCOUNTER — Ambulatory Visit: Payer: PRIVATE HEALTH INSURANCE | Admitting: Psychiatry

## 2024-11-13 ENCOUNTER — Encounter: Payer: MEDICAID | Admitting: Nurse Practitioner
